# Patient Record
Sex: Male | Born: 1935
Health system: Southern US, Community
[De-identification: ages and names within clinical notes are randomized; demographics above are authoritative.]

## PROBLEM LIST (undated history)

## (undated) DIAGNOSIS — K449 Diaphragmatic hernia without obstruction or gangrene: Secondary | ICD-10-CM

## (undated) DIAGNOSIS — D333 Benign neoplasm of cranial nerves: Secondary | ICD-10-CM

## (undated) DIAGNOSIS — I4891 Unspecified atrial fibrillation: Secondary | ICD-10-CM

## (undated) DIAGNOSIS — I493 Ventricular premature depolarization: Secondary | ICD-10-CM

## (undated) DIAGNOSIS — K2970 Gastritis, unspecified, without bleeding: Secondary | ICD-10-CM

## (undated) DIAGNOSIS — Z86010 Personal history of colonic polyps: Secondary | ICD-10-CM

## (undated) DIAGNOSIS — I10 Essential (primary) hypertension: Secondary | ICD-10-CM

## (undated) DIAGNOSIS — I2510 Atherosclerotic heart disease of native coronary artery without angina pectoris: Secondary | ICD-10-CM

## (undated) DIAGNOSIS — I5032 Chronic diastolic (congestive) heart failure: Secondary | ICD-10-CM

## (undated) DIAGNOSIS — K648 Other hemorrhoids: Secondary | ICD-10-CM

## (undated) DIAGNOSIS — K222 Esophageal obstruction: Secondary | ICD-10-CM

## (undated) DIAGNOSIS — K5730 Diverticulosis of large intestine without perforation or abscess without bleeding: Secondary | ICD-10-CM

## (undated) DIAGNOSIS — I340 Nonrheumatic mitral (valve) insufficiency: Secondary | ICD-10-CM

## (undated) DIAGNOSIS — K2990 Gastroduodenitis, unspecified, without bleeding: Secondary | ICD-10-CM

## (undated) DIAGNOSIS — J45909 Unspecified asthma, uncomplicated: Secondary | ICD-10-CM

## (undated) DIAGNOSIS — Z973 Presence of spectacles and contact lenses: Secondary | ICD-10-CM

## (undated) DIAGNOSIS — I4819 Other persistent atrial fibrillation: Secondary | ICD-10-CM

## (undated) DIAGNOSIS — I351 Nonrheumatic aortic (valve) insufficiency: Secondary | ICD-10-CM

## (undated) DIAGNOSIS — J40 Bronchitis, not specified as acute or chronic: Secondary | ICD-10-CM

## (undated) HISTORY — DX: Personal history of colonic polyps: Z86.010

## (undated) HISTORY — PX: CARDIAC CATHETERIZATION: SHX172

## (undated) HISTORY — DX: Gastroduodenitis, unspecified, without bleeding: K29.90

## (undated) HISTORY — PX: TESTICLAR CYST EXCISION: SHX2492

## (undated) HISTORY — DX: Atherosclerotic heart disease of native coronary artery without angina pectoris: I25.10

## (undated) HISTORY — PX: TONSILLECTOMY: SUR1361

## (undated) HISTORY — DX: Chronic diastolic (congestive) heart failure: I50.32

## (undated) HISTORY — DX: Unspecified asthma, uncomplicated: J45.909

## (undated) HISTORY — DX: Bronchitis, not specified as acute or chronic: J40

## (undated) HISTORY — PX: KNEE SURGERY: SHX244

## (undated) HISTORY — DX: Essential (primary) hypertension: I10

## (undated) HISTORY — DX: Ventricular premature depolarization: I49.3

## (undated) HISTORY — DX: Nonrheumatic aortic (valve) insufficiency: I35.1

## (undated) HISTORY — DX: Esophageal obstruction: K22.2

## (undated) HISTORY — DX: Other hemorrhoids: K64.8

## (undated) HISTORY — DX: Benign neoplasm of cranial nerves: D33.3

## (undated) HISTORY — DX: Gastritis, unspecified, without bleeding: K29.70

## (undated) HISTORY — DX: Diaphragmatic hernia without obstruction or gangrene: K44.9

## (undated) HISTORY — DX: Nonrheumatic mitral (valve) insufficiency: I34.0

## (undated) HISTORY — DX: Other persistent atrial fibrillation: I48.19

## (undated) HISTORY — DX: Diverticulosis of large intestine without perforation or abscess without bleeding: K57.30

---

## 1998-07-23 ENCOUNTER — Ambulatory Visit (HOSPITAL_COMMUNITY): Admission: RE | Admit: 1998-07-23 | Discharge: 1998-07-23 | Payer: Self-pay | Admitting: Cardiology

## 1999-06-24 ENCOUNTER — Ambulatory Visit (HOSPITAL_COMMUNITY): Admission: RE | Admit: 1999-06-24 | Discharge: 1999-06-24 | Payer: Self-pay | Admitting: *Deleted

## 1999-06-24 ENCOUNTER — Encounter: Payer: Self-pay | Admitting: *Deleted

## 1999-09-19 ENCOUNTER — Inpatient Hospital Stay (HOSPITAL_COMMUNITY): Admission: EM | Admit: 1999-09-19 | Discharge: 1999-09-21 | Payer: Self-pay | Admitting: Emergency Medicine

## 1999-09-19 ENCOUNTER — Encounter: Payer: Self-pay | Admitting: Emergency Medicine

## 2002-03-16 ENCOUNTER — Encounter (INDEPENDENT_AMBULATORY_CARE_PROVIDER_SITE_OTHER): Payer: Self-pay | Admitting: Specialist

## 2002-03-16 ENCOUNTER — Ambulatory Visit (HOSPITAL_COMMUNITY): Admission: RE | Admit: 2002-03-16 | Discharge: 2002-03-16 | Payer: Self-pay | Admitting: Gastroenterology

## 2003-04-02 ENCOUNTER — Emergency Department (HOSPITAL_COMMUNITY): Admission: EM | Admit: 2003-04-02 | Discharge: 2003-04-02 | Payer: Self-pay

## 2003-09-29 ENCOUNTER — Encounter: Admission: RE | Admit: 2003-09-29 | Discharge: 2003-09-29 | Payer: Self-pay | Admitting: Internal Medicine

## 2004-02-28 ENCOUNTER — Ambulatory Visit (HOSPITAL_COMMUNITY): Admission: RE | Admit: 2004-02-28 | Discharge: 2004-02-28 | Payer: Self-pay | Admitting: Gastroenterology

## 2004-02-28 ENCOUNTER — Encounter (INDEPENDENT_AMBULATORY_CARE_PROVIDER_SITE_OTHER): Payer: Self-pay | Admitting: *Deleted

## 2004-09-24 ENCOUNTER — Ambulatory Visit: Payer: Self-pay | Admitting: Gastroenterology

## 2004-10-30 ENCOUNTER — Ambulatory Visit (HOSPITAL_COMMUNITY): Admission: RE | Admit: 2004-10-30 | Discharge: 2004-10-30 | Payer: Self-pay | Admitting: Gastroenterology

## 2004-10-30 ENCOUNTER — Ambulatory Visit: Payer: Self-pay | Admitting: Gastroenterology

## 2004-10-30 ENCOUNTER — Encounter (INDEPENDENT_AMBULATORY_CARE_PROVIDER_SITE_OTHER): Payer: Self-pay | Admitting: Specialist

## 2006-02-04 ENCOUNTER — Ambulatory Visit: Payer: Self-pay | Admitting: Gastroenterology

## 2006-02-11 ENCOUNTER — Ambulatory Visit: Payer: Self-pay | Admitting: Gastroenterology

## 2006-02-11 ENCOUNTER — Encounter: Payer: Self-pay | Admitting: Cardiology

## 2006-02-11 ENCOUNTER — Encounter (INDEPENDENT_AMBULATORY_CARE_PROVIDER_SITE_OTHER): Payer: Self-pay | Admitting: Specialist

## 2006-12-11 ENCOUNTER — Ambulatory Visit: Payer: Self-pay | Admitting: Gastroenterology

## 2006-12-14 ENCOUNTER — Ambulatory Visit: Payer: Self-pay | Admitting: Gastroenterology

## 2007-01-13 ENCOUNTER — Ambulatory Visit: Payer: Self-pay | Admitting: Gastroenterology

## 2007-01-13 ENCOUNTER — Encounter (INDEPENDENT_AMBULATORY_CARE_PROVIDER_SITE_OTHER): Payer: Self-pay | Admitting: *Deleted

## 2007-12-31 ENCOUNTER — Ambulatory Visit: Payer: Self-pay | Admitting: Gastroenterology

## 2008-01-12 ENCOUNTER — Encounter: Payer: Self-pay | Admitting: Gastroenterology

## 2008-01-12 ENCOUNTER — Ambulatory Visit: Payer: Self-pay | Admitting: Gastroenterology

## 2008-01-12 DIAGNOSIS — Z860100 Personal history of colon polyps, unspecified: Secondary | ICD-10-CM

## 2008-01-12 HISTORY — DX: Personal history of colonic polyps: Z86.010

## 2008-01-12 HISTORY — DX: Personal history of colon polyps, unspecified: Z86.0100

## 2008-06-21 ENCOUNTER — Telehealth: Payer: Self-pay | Admitting: Gastroenterology

## 2008-06-28 DIAGNOSIS — Z860100 Personal history of colon polyps, unspecified: Secondary | ICD-10-CM | POA: Insufficient documentation

## 2008-06-28 DIAGNOSIS — I10 Essential (primary) hypertension: Secondary | ICD-10-CM | POA: Insufficient documentation

## 2008-06-28 DIAGNOSIS — K648 Other hemorrhoids: Secondary | ICD-10-CM | POA: Insufficient documentation

## 2008-06-28 DIAGNOSIS — K222 Esophageal obstruction: Secondary | ICD-10-CM | POA: Insufficient documentation

## 2008-06-28 DIAGNOSIS — K449 Diaphragmatic hernia without obstruction or gangrene: Secondary | ICD-10-CM | POA: Insufficient documentation

## 2008-06-28 DIAGNOSIS — K5730 Diverticulosis of large intestine without perforation or abscess without bleeding: Secondary | ICD-10-CM | POA: Insufficient documentation

## 2008-06-29 ENCOUNTER — Ambulatory Visit: Payer: Self-pay | Admitting: Gastroenterology

## 2008-06-29 DIAGNOSIS — K219 Gastro-esophageal reflux disease without esophagitis: Secondary | ICD-10-CM | POA: Insufficient documentation

## 2008-07-03 ENCOUNTER — Inpatient Hospital Stay (HOSPITAL_BASED_OUTPATIENT_CLINIC_OR_DEPARTMENT_OTHER): Admission: RE | Admit: 2008-07-03 | Discharge: 2008-07-03 | Payer: Self-pay | Admitting: Cardiology

## 2008-08-01 ENCOUNTER — Ambulatory Visit: Payer: Self-pay | Admitting: Gastroenterology

## 2008-09-27 ENCOUNTER — Telehealth: Payer: Self-pay | Admitting: Gastroenterology

## 2008-11-27 ENCOUNTER — Encounter: Admission: RE | Admit: 2008-11-27 | Discharge: 2008-11-27 | Payer: Self-pay | Admitting: Otolaryngology

## 2008-12-06 ENCOUNTER — Telehealth: Payer: Self-pay | Admitting: Gastroenterology

## 2008-12-14 ENCOUNTER — Encounter (INDEPENDENT_AMBULATORY_CARE_PROVIDER_SITE_OTHER): Payer: Self-pay | Admitting: *Deleted

## 2009-01-17 ENCOUNTER — Ambulatory Visit: Payer: Self-pay | Admitting: Gastroenterology

## 2009-01-17 ENCOUNTER — Telehealth: Payer: Self-pay | Admitting: Gastroenterology

## 2009-01-26 ENCOUNTER — Telehealth: Payer: Self-pay | Admitting: Gastroenterology

## 2009-01-30 ENCOUNTER — Telehealth: Payer: Self-pay | Admitting: Gastroenterology

## 2009-01-31 ENCOUNTER — Ambulatory Visit: Payer: Self-pay | Admitting: Gastroenterology

## 2009-06-01 ENCOUNTER — Encounter: Admission: RE | Admit: 2009-06-01 | Discharge: 2009-06-01 | Payer: Self-pay | Admitting: Otolaryngology

## 2009-09-03 ENCOUNTER — Telehealth: Payer: Self-pay | Admitting: Gastroenterology

## 2009-11-22 ENCOUNTER — Telehealth: Payer: Self-pay | Admitting: Gastroenterology

## 2009-11-27 ENCOUNTER — Ambulatory Visit: Payer: Self-pay | Admitting: Gastroenterology

## 2009-11-27 ENCOUNTER — Encounter: Admission: RE | Admit: 2009-11-27 | Discharge: 2009-11-27 | Payer: Self-pay | Admitting: Otolaryngology

## 2009-11-29 ENCOUNTER — Telehealth: Payer: Self-pay | Admitting: Gastroenterology

## 2009-12-03 ENCOUNTER — Encounter: Payer: Self-pay | Admitting: Gastroenterology

## 2009-12-03 ENCOUNTER — Ambulatory Visit (HOSPITAL_COMMUNITY): Admission: RE | Admit: 2009-12-03 | Discharge: 2009-12-03 | Payer: Self-pay | Admitting: Gastroenterology

## 2009-12-07 ENCOUNTER — Ambulatory Visit: Payer: Self-pay | Admitting: Gastroenterology

## 2009-12-13 ENCOUNTER — Telehealth (INDEPENDENT_AMBULATORY_CARE_PROVIDER_SITE_OTHER): Payer: Self-pay | Admitting: *Deleted

## 2010-10-08 ENCOUNTER — Telehealth: Payer: Self-pay | Admitting: Gastroenterology

## 2010-11-05 ENCOUNTER — Encounter
Admission: RE | Admit: 2010-11-05 | Discharge: 2010-11-05 | Payer: Self-pay | Source: Home / Self Care | Attending: Internal Medicine | Admitting: Internal Medicine

## 2010-11-13 ENCOUNTER — Other Ambulatory Visit (HOSPITAL_COMMUNITY): Payer: Self-pay | Admitting: Internal Medicine

## 2010-11-13 DIAGNOSIS — M999 Biomechanical lesion, unspecified: Secondary | ICD-10-CM

## 2010-11-14 NOTE — Progress Notes (Signed)
Summary: procedure ?   Phone Note Call from Patient Call back at Home Phone [phone]   Caller: Patient Call For: Dr. Jarold Motto Reason for Call: Talk to Nurse Summary of Call: pt wants to know if he can take his blood pressure medication the morning of his hospital procedure Initial call taken by: Vallarie Mare,  November 29, 2009 4:30 PM  Follow-up for Phone Call        Per monometry instructions - Nothing to eat or drink after midnight.  Hold meds until after proc.  Pt notified. Follow-up by: Ashok Cordia RN,  November 29, 2009 4:33 PM

## 2010-11-14 NOTE — Progress Notes (Signed)
Summary: Test results   Phone Note Outgoing Call   Call placed by: Ashok Cordia RN,  December 13, 2009 10:14 AM Summary of Call: LM for pt to call.  To go over results of Monometry and 24 hr ph study. Initial call taken by: Ashok Cordia RN,  December 13, 2009 10:15 AM  Follow-up for Phone Call        Discussed results of manometry and ph study with pt.   Follow-up by: Ashok Cordia RN,  December 13, 2009 11:59 AM

## 2010-11-14 NOTE — Progress Notes (Signed)
Summary: refill   Phone Note Call from Patient Call back at Home Phone [phone]   Caller: Patient Call For: Dr Jarold Motto Reason for Call: Refill Medication Summary of Call: Patient needs an rx called in for Dexilant and would also like samples. Initial call taken by: Tawni Levy,  October 08, 2010 10:42 AM  Follow-up for Phone Call        pt states that he gets his Dexilant from [place] and he needs to pick up a rx for this one is printed and I will have Dr. Maryruth Hancock it when he returns on 10/16/2010. and pt would like samples of Dexilant which I will leave at the front desk when his rx is signed.  Follow-up by: Harlow Mares CMA Duncan Dull),  October 08, 2010 11:08 AM    Prescriptions: DEXILANT 60 MG CPDR (DEXLANSOPRAZOLE) 1 by mouth qd  #90 x 3   Entered by:   Harlow Mares CMA (AAMA)   Authorized by:   Mardella Layman MD [facility]   Signed by:   Harlow Mares CMA (AAMA) on 10/08/2010   Method used:   Print then Give to Patient   RxID:   [id number]

## 2010-11-14 NOTE — Assessment & Plan Note (Signed)
Summary: Esophageal spasms/dfs    History of Present Illness Visit Type: Follow-up Visit Primary GI MD: Sheryn Bison MD FACP FAGA Primary Provider: Creola Corn, MD Chief Complaint: Patient c/o several weeks nocturnal chest pain. He c/o difficulty swallowing pills and fluids. He has occasional increased belching. He denies any other symptoms. History of Present Illness:   77 -year-old Caucasian male with hypertensive cardiovascular disease and recurrent cardiac palpitations, noncardiac chest pain, asthmatic bronchitis, chronic acid reflux, and a history of recurrent colon polyps with frequent colonoscopies. He currently complains of awakening at 3:00 with chest pressure and a throbbing sensation substernally which will last one hour in duration. This is not associated with typical reflux symptoms, dysphagia, or any hepatobiliary complaints. He currently is undergoing Holter monitors in addition per Dr. Donnie Aho, cardiology. His primary care physician is Dr. Creola Corn.  Mr. Dorris Fetch has had several endoscopic exams which have shown erosive esophagitis and recurrent peptic strictures of his esophagus. He is chronically on Dexilant 60 mg q.a.m. with p.r.n. liquid Carafate. He continues with atypical chest pain occasionally exacerbated by extremely cold liquids. He has not had previous manometry or 24-hour pH probe testing. He has had several negative upper abdominal ultrasound exams. He uses p.r.n. sublingual Levsin. Is on multiple antihypertensive medications. Despite these complaints he has had no anorexia or weight loss. He denies any specific hepatobiliary complaints or lower GI problems at this time, melena, or hematochezia. He denies systemic complaints such as fever, chills, skin rashes, etc. He does take daily aspirin 81 mg.   GI Review of Systems    Reports acid reflux, belching, chest pain, dysphagia with liquids, and  heartburn.      Denies abdominal pain, bloating, dysphagia with solids,  loss of appetite, nausea, vomiting, vomiting blood, weight loss, and  weight gain.      Reports constipation.     Denies anal fissure, black tarry stools, change in bowel habit, diarrhea, diverticulosis, fecal incontinence, heme positive stool, hemorrhoids, irritable bowel syndrome, jaundice, light color stool, liver problems, rectal bleeding, and  rectal pain.    Current Medications (verified): 1)  Procardia Xl 30 Mg Xr24h-Tab (Nifedipine) .... One By Mouth Once Daily 2)  Hyzaar 100-25 Mg Tabs (Losartan Potassium-Hctz) .... 1/2 By Mouth Every Morning 3)  Dexilant 60 Mg Cpdr (Dexlansoprazole) .Marland Kitchen.. 1 By Mouth Qd 4)  Adult Aspirin Ec Low Strength 81 Mg Tbec (Aspirin) .... One By Mouth Once Daily 5)  Finasteride 5 Mg Tabs (Finasteride) .... Take 1 Tablet By Mouth Once A Day 6)  Flomax 0.4 Mg Xr24h-Cap (Tamsulosin Hcl) .... One By Mouth Once Daily 7)  Vytorin 10-20 Mg Tabs (Ezetimibe-Simvastatin) .... One By Mouth Once Daily 8)  Carafate 1 Gm/27ml  Susp (Sucralfate) .... Take 10 Cc By Mouth Every 2-4 Hours As Needed 9)  Levsin 0.125 Mg  Tabs (Hyoscyamine Sulfate) .... Take One By Mouth Every 6 Hours As Needed For Chest Pain.  Allergies (verified): No Known Drug Allergies  Past History:  Past medical, surgical, family and social histories (including risk factors) reviewed for relevance to current acute and chronic problems.  Past Medical History: Reviewed history from 06/28/2008 and no changes required. Current Problems:  BRONCHITIS (ICD-490) HYPERTENSION (ICD-401.9) INTERNAL HEMORRHOIDS (ICD-455.0) ESOPHAGEAL STRICTURE (ICD-530.3) GASTRITIS (ICD-535.50) HIATAL HERNIA (ICD-553.3) DIVERTICULOSIS, COLON (ICD-562.10) COLONIC POLYPS, HX OF (ICD-V12.72)  Past Surgical History: testicle cyst removed patella removed left knee as a child Tonsillectomy Heart Cath.  Family History: Reviewed history from 06/28/2008 and no changes required. No FH of  Colon Cancer: Lung Cancer:  brother Family History of Heart Disease: father Family History of Kidney Disease: Father  Social History: Reviewed history from 06/28/2008 and no changes required. 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 Alcohol Use - no Patient has never smoked.   Review of Systems       The patient complains of allergy/sinus, back pain, hearing problems, muscle pains/cramps, nosebleeds, shortness of breath, sleeping problems, and urination - excessive.  The patient denies anemia, anxiety-new, arthritis/joint pain, blood in urine, breast changes/lumps, change in vision, confusion, cough, coughing up blood, depression-new, fainting, fatigue, fever, headaches-new, heart murmur, heart rhythm changes, itching, menstrual pain, night sweats, pregnancy symptoms, skin rash, sore throat, swelling of feet/legs, swollen lymph glands, thirst - excessive , urination - excessive , urination changes/pain, urine leakage, vision changes, and voice change.    Vital Signs:  Patient profile:   77 year old male Height:      70 inches Weight:      179.25 pounds BMI:     25.81 BSA:     1.99 Pulse rate:   64 / minute Pulse rhythm:   regular BP sitting:   128 / 68  (right arm)  Vitals Entered By: Hortense Ramal CMA Duncan Dull) (November 27, 2009 3:17 PM)  Physical Exam  General:  Well developed, well nourished, no acute distress.healthy appearing.   Head:  Normocephalic and atraumatic. Eyes:  PERRLA, no icterus.exam deferred to patient's ophthalmologist.   Neck:  Supple; no masses or thyromegaly. Chest Wall:  Symmetrical,  no deformities . Lungs:  Clear throughout to auscultation. Heart:  Regular rate and rhythm; no murmurs, rubs,  or bruits.He is a regular rhythm and I cannot appreciate any ectopic beats at this time. Abdomen:  Soft, nontender and nondistended. No masses, hepatosplenomegaly or hernias noted. Normal bowel sounds. Extremities:  No clubbing, cyanosis, edema or deformities  noted. Neurologic:  Alert and  oriented x4;  grossly normal neurologically. Cervical Nodes:  No significant cervical adenopathy. Axillary Nodes:  No significant axillary adenopathy. Inguinal Nodes:  No significant inguinal adenopathy. Psych:  Alert and cooperative. Normal mood and affect.   Impression & Recommendations:  Problem # 1:  GERD (ICD-530.81) Assessment Improved Will proceed with esophageal manometry and 24-hour pH probe testing off PPI medications and Carafate 5 days before these exams. It is unclear to me the exact cause of his atypical chest pain. He also has had repeat cardiac evaluation which have been nonrevealing. He may need followup endoscopic exam since this has not been performed in several years. He is to complete his Holter monitor exam as recommended by cardiology.  Problem # 2:  HYPERTENSION (ICD-401.9) Assessment: Improved blood pressure today is normal at 128/68 and pulse is 64 and regular. He is to continue Procardia, Hyzaar, finasteride, Flomax, Vytorin, and p.r.n. Levsin.  Problem # 3:  COLONIC POLYPS, HX OF (ICD-V12.72) Assessment: Unchanged followup colonoscopy as per clinical protocol.  Problem # 4:  DIVERTICULOSIS, COLON (ICD-562.10) Assessment: Improved High-Fiber diet as tolerated  Other Orders: Mano/24 hour pH Probe (Mano/24 pH)  Patient Instructions: 1)  Copy sent to : Dr. Creola Corn and Dr. Viann Fish 2)  Please continue current medications.  3)  Esophageal Manometry and 24 a pH probe testing off of PPI medication for 5 days before exams. 4)  Complete Holter monitor exam 5)  The medication list was reviewed and reconciled.  All changed / newly prescribed medications were explained.  A complete medication list was provided to the patient /  caregiver.

## 2010-11-14 NOTE — Procedures (Signed)
Summary: manometry & Ph probe   Esophageal Manometry  Procedure date:  12/03/2009  Findings:      normal:   Esophageal manometry was completed on December 03, 2009. Results are as follows:  #1 upper esophageal sphincter-there is normal coordination between pharyngeal contraction cricopharyngeal relaxation.  #2 lower esophageal sphincter-mean pressure is normal at 40 mm of mercury with normal relaxation of swallowing.  #3-motility pattern-they're normally propagated peristaltic waves of normal amplitude and duration throughout the length of the esophagus both wet and dry swallows. Mean amplitude of contractions is 198 mm of mercury.  Assessment: This a normal esophageal manometry without evidence of lower esophageal sphincter incompetency or esophageal spasm. This patient has atypical chest pain although he is managed for chronic GERD. 24-hour pH probe test was also completed today and is consistent with chronic daytime acid reflux.    24-hour pH probe testing results  Outpatient pH probe test was also accomplished. Total DeMeester score normal at 21 normal being less than 22. However, there is increased acid reflux in the upright position with total time the pH is less than 4 at 8% normal being less than 6.3%. Total time in reflux is also elevated at 5.2%, normal less than 4.2%. There are 6 reflux episodes normal less than 50. Longest reflux episode was 7.8 minutes. There is no proximal acid reflux noted and there is no nocturnal acid reflux noted.  Assessment: This 24 pH probe test shows mild daytime upright GERD. There is no nocturnal GERD and no correlation with his symptoms in any recorded episodes of acid reflux. We will continue his reflux regime and daily PPI therapy but I see no need for further GI evaluation this time.

## 2010-11-14 NOTE — Progress Notes (Signed)
Summary: esoph, spasms   Phone Note Call from Patient Call back at [phone]   Caller: Patient Call For: Dr. Jarold Motto Reason for Call: Talk to Nurse Summary of Call: pt says he has esophagial spasms at night and wants to know if this can make his blood pressure go up Initial call taken by: Vallarie Mare,  November 22, 2009 9:46 AM  Follow-up for Phone Call        Attempted to call pt, no answer. Lupita Leash Surface RN  November 22, 2009 1:53 PM  Talked with pt.  C/O chest pain during night.  Negative cardiac work up.  Appt scheduled. Follow-up by: Ashok Cordia RN,  November 23, 2009 2:44 PM

## 2010-11-18 ENCOUNTER — Ambulatory Visit (HOSPITAL_COMMUNITY)
Admission: RE | Admit: 2010-11-18 | Discharge: 2010-11-18 | Disposition: A | Discharge status: Home / Self Care | Payer: Medicare Other | Source: Ambulatory Visit | Attending: Internal Medicine | Admitting: Internal Medicine

## 2010-11-18 ENCOUNTER — Ambulatory Visit (HOSPITAL_COMMUNITY): Payer: Medicare Other

## 2010-11-18 ENCOUNTER — Other Ambulatory Visit: Payer: Self-pay | Admitting: Interventional Radiology

## 2010-11-18 DIAGNOSIS — R229 Localized swelling, mass and lump, unspecified: Secondary | ICD-10-CM | POA: Insufficient documentation

## 2010-11-18 DIAGNOSIS — M999 Biomechanical lesion, unspecified: Secondary | ICD-10-CM

## 2010-11-18 DIAGNOSIS — Z01812 Encounter for preprocedural laboratory examination: Secondary | ICD-10-CM | POA: Insufficient documentation

## 2010-11-18 LAB — CBC
HCT: 42.5 % (ref 39.0–52.0)
MCH: 30.7 pg (ref 26.0–34.0)
MCHC: 35.3 g/dL (ref 30.0–36.0)
MCV: 86.9 fL (ref 78.0–100.0)
Platelets: 142 10*3/uL — ABNORMAL LOW (ref 150–400)
RDW: 12.4 % (ref 11.5–15.5)

## 2011-02-25 NOTE — Cardiovascular Report (Signed)
Derek Schneider, Derek Schneider              ACCOUNT NO.:  [id number]   MEDICAL RECORD NO.:  [id number]          PATIENT TYPE:  OIB   LOCATION:  1962                         FACILITY:  [facility]   PHYSICIAN:  Georga Hacking, M.D.DATE OF BIRTH:  18-Jul-1926   DATE OF PROCEDURE:  07/03/2008  DATE OF DISCHARGE:  07/03/2008                            CARDIAC CATHETERIZATION   HISTORY:  The patient has a history of mild coronary artery disease with  last catheterization done in December 2000.  He had multiple visits to  the emergency room and to the office this year complaining of chest pain  with some radiation into the left arm.  He has a previous history of  esophageal spasm.  He has been having continued shoulder pain.   PROCEDURE:  Left heart catheterization with coronary angiograms and left  ventriculogram.   PROCEDURE IN DETAIL:  The procedure was done in the outpatient  diagnostic laboratory.  The right femoral artery was entered using a  single anterior needle wall stick and a 4-French sheath was placed.  The  procedure was done with a 4-French catheters and a 30 mL ventriculogram  was performed.  He tolerated the procedure well.   HEMODYNAMIC DATA:  Left ventricular 140/12-20, aorta postcontrast  148/61.  Coronary arteries arise and distribute normally.  There is very  mild calcification involving the coronary arteries.  Left main coronary  artery with mild irregularity.  Left anterior descending:  The vessel  was mildly calcified proximally.  A series of 3 diagonal branches arise  off the artery.  There is diffuse disease involving the LAD and the  diagonal branches that is not severely obstructive.  The optional  diagonal or intermediate branch arises, which could be considered the  first diagonal branch.  There is an eccentric 30% proximal LAD stenosis  after the origin of this diagonal branch.  The circumflex coronary  artery:  Contains no significant disease.  Right coronary  artery:  Dominant vessel with scattered irregularities supplies the posterior  descending and a series of several small posterolateral branches.   IMPRESSION:  1. Mild coronary artery disease, predominantly involving the left      anterior descending coronary artery with scattered irregularities      elsewhere.  2. Normal left ventricular function.      Georga Hacking, M.D.  Electronically Signed     WST/MEDQ  D:  07/03/2008  T:  07/03/2008  Job:  [id number]   cc:   Gwen Pounds, MD

## 2011-02-28 NOTE — Assessment & Plan Note (Signed)
[facility]                         GASTROENTEROLOGY OFFICE NOTE   Derek, Schneider                     MRN:          [id number]  DATE:12/11/2006                            DOB:          1926/01/18    Derek Schneider is a 77 year old white male with chronic asthmatic  bronchitis who I have been following for many years because of recurrent  colon polyps and chronic acid reflux disease.  Previous workup several  years ago for gallstones was negative.   The patient was supposed to be on regular Protonix therapy because of  his hiatal hernia and chronic GERD with recurrent peptic strictures of  his esophagus.  However, he stopped this and was taking p.r.n. Prilosec  and has had recurrence of burning pressure in his subxiphoid area  radiating to his chest without dysphagia.  He has had cardiac workup by  Dr. Donnie Schneider including stress testing which was normal.  He denies any  specific hepatobiliary complaints or lower GI problems at this time.  He  last had colonoscopy with removal of a flat villous adenoma  approximately a year ago.  His appetite is good and his weight is  stable.  He denies recent use of steroids or antibiotics.  He has had no  trauma to his chest or abdominal area.  He has no systemic complaints,  his appetite is good, and his weight is stable.  He has been using large  amounts of caffeine which he has cut back on at this time.   MEDICATIONS:  1. Procardia 30 mg a day.  2. Hyzaar 100/25 mg one-half a tablet a day.  3. Protonix 40 mg a day.  4. Aspirin 325 mg a day.  5. Avodart 0.5 mg a day.  6. Vytorin 20 mg a day.  7. He uses p.r.n. Proventil and albuterol inhalers.   He weighs 187 Schneider and blood pressure is 110/68.  Pulse was 58 and  regular.  I could not appreciate stigmata of chronic liver disease.  Chest was clear and cardiac exam showed him to be in a regular rhythm  without murmurs, gallops or rubs.  I could not  appreciate  hepatosplenomegaly, abdominal mass or tenderness.  Bowel sounds were  normal.   ASSESSMENT:  1. [name] probably has worsening acid reflux, rule out      cholelithiasis.  2. Need for followup colonoscopy per flat villous adenoma of the right      colon which the followup is due in 1 year's time.  3. History of asthmatic bronchitis.  The patient has not smoked in      many years and denies ethanol abuse.   RECOMMENDATIONS:  1. Strict antireflux maneuvers.  2. Continue Protonix 40 mg twice a day with Carafate suspension two      teaspoons after meals and at bedtime.  3. P.r.n. sublingual Levsin use.  4. Outpatient endoscopy, colonoscopy, and ultrasound exam.  5. Continue other medications as per Dr. Donnie Schneider and Dr. Timothy Schneider.     Derek Schneider. Derek Motto, MD, Derek Schneider, FAGA  Electronically Signed    DRP/MedQ  DD: 12/11/2006  DT: 12/11/2006  Job #: [id number]   cc:   Derek Pounds, MD  Derek Schneider, M.D.

## 2011-02-28 NOTE — H&P (Signed)
[place]  Patient:    JOHNWESLEY Schneider                      MRN: [id number] Adm. Date:  [id number] Attending:  Norman Clay CC:         Winn Jock Charmian Muff, M.D.                         History and Physical  REASON FOR ADMISSION:  Arm numbness and weakness with vague chest pain.  HISTORY:  The patient is a 77 year old male who has a prior history of hypertension.  In 1991, he had an exercise treadmill test prior to beginning an  exercise program which was abnormal.  An exercise stress test showed a possible old scar and possible reversible ischemia.  He underwent cardiac catheterization at  that time with findings of minimal coronary artery disease.  He had done well and was seen at the office one year ago when he had some atypical-type chest pain that he was seeing at Dr. Janene Madeira office.  It was dull with a pleuritic component. e had an abnormal CPK total, but not MB was drawn.  He has vague indigestion symptoms but had be able to be physically active.  A stress Cardiolite study was normal t that time.  He had done well but has had some continued atypical-type chest pain and indigestion pain but has been able to be active, walking, playing tennis, and doing normal exercises.  He awoke with severe weakness and bilateral arm numbness and had some vague anterior chest pain.  He took an aspirin.  His arms felt weak, and he came to the emergency room.  An EKG was unremarkable. CPK was drawn and returned elevated with an MB of 9.  Troponin was negative, and I was asked to see him.  He complains of vague anterior chest pain which is minimal at this time.  PAST MEDICAL HISTORY:  Remarkable for hypertension.  There is history of hyperlipidemia previously.  There is no history of diabetes.  PAST SURGICAL HISTORY:  Knee surgery, tonsillectomy, cyst on right testicle in he 1990s.  ALLERGIES:  None.  CURRENT MEDICATIONS: 1.  Procardia XL 30 mg daily. 2. HCTZ 25 mg daily. 3. Aspirin daily.  FAMILY HISTORY:  Father died at age 32 of an MI.  Mother died at age 82.  Three  brothers are living.  Two sisters are living.  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 He works out regularly with vigorous exercise.  He does not smoke or use alcohol to excess.  He is married.  REVIEW OF SYSTEMS:  Occasional cramping in the legs at rest, particularly in the left leg.  No claudication, no TIAs, some dyspepsia.  He has had a recent upper  respiratory infection with some bloody nose and had used a decongestant as well as Nasonex.   His blood pressure was somewhat elevated on admission to the hospital.  PHYSICAL EXAMINATION:  GENERAL:  He is a pleasant male who appears in no acute distress.  VITAL SIGNS:  Blood pressure currently 135/80, pulse 70.  SKIN:  Warm and dry.  HEENT:  Normal.  He is wearing oxygen.  EOMI.  PERRLA.  C&S clear.  Pharynx negative.  NECK:  Supple without masses, thyromegaly, or carotid bruits.  LUNGS:  Clear.  CARDIOVASCULAR:  Normal S1 and S2.  No S3 or murmur.  ABDOMEN:  Soft, nontender.  EXTREMITIES:   Femoral and distal pulses 2+, no edema noted.  NEUROLOGIC:  Normal.  LABORATORY DATA:  A 12-lead ECG shows IV conduction delays, otherwise normal.  Chest x-ray unremarkable.  IMPRESSION: 1. A prolonged episode of bilateral arm numbness and weakness with vague chest    discomfort.  Rule out anginal equivalent in a patient with symptoms that are    better with nitroglycerin and with a positive CK-MB. 2. Hypertension. 3. Prior history of hyperlipidemia. 4. Recent upper respiratory infection.  RECOMMENDATIONS: 1. Begin IV heparin and nitroglycerin. 2. Check serial CPK 3. Likely will go ahead with urgent cardiac catheterization in view of the positive    enzymes to assess for worsening or change in coronary artery disease status. DD:   09/19/99 TD:  09/19/99 Job: [id number]

## 2011-02-28 NOTE — Discharge Summary (Signed)
[place]  Patient:    Derek Schneider                      MRN: [id number] Adm. Date:  [id number] Disc. Date: 09/20/99 Attending:  Norman Clay CC:         Winn Jock Charmian Muff, M.D.                  Referring Physician Discharge [place]  FINAL DIAGNOSES: 1. Bilateral arm numbness and weakness, questionable cause. 2. Borderline elevation of CPK and MB with minimal coronary artery disease at    cardiac catheterization, questionable coronary spasm. 3. Hypertension. 4. Mild hyperlipidemia.  PROCEDURES:  Cardiac catheterization.  HISTORY:  The patient is a 77 year old male who had a normal stress Cardiolite study about a year ago.  He awoke with severe weakness and bilateral arm numbness, had vague anterior chest pain.  He took an aspirin and came to the emergency room. A head CT scan was unremarkable.  CPK was minimally elevated, with mild MB of 9. Troponin was negative.  EKG was unremarkable.  He was admitted and placed on heparin and IV nitroglycerin while following this.  He was taken to the catheterization laboratory on September 20, 1999, with findings of a mild to moderate 30-40% LAD stenosis.  Minimal irregularities were present in the other vessels.  The results were discussed with the patient.  It was recommended at this time that he continue medical therapy.  He is already on Procardia, and it was thought that he may have had an upper respiratory infection, with decongestants that could have induced some spasm versus having a chronic elevation of CPK.  CPK will be checked prior to discharge and also as an outpatient, to see if this is in fact the case.  If CPK comes back down to normal, it is possible he may have had some small spasm to account for this episode.  He is discharged at this time in improved condition.  DISCHARGE MEDICATIONS: 1. Aspirin daily. 2. Procardia XL 30 mg daily. 3. HCTZ 25 mg daily. 4.  Nitroglycerin p.r.n.  FOLLOW-UP:  He is to follow up in one week.  ACTIVITY:  He is to restrict his activities for the next week.  We will do another Cardiolite prior to starting an exercise program. DD:  09/20/99 TD:  09/20/99 Job: [id number]

## 2011-04-30 ENCOUNTER — Telehealth: Payer: Self-pay | Admitting: Gastroenterology

## 2011-04-30 MED ORDER — DEXLANSOPRAZOLE 60 MG PO CPDR: 60.0000 mg | DELAYED_RELEASE_CAPSULE | Freq: Every day | ORAL | Status: DC

## 2011-04-30 NOTE — Telephone Encounter (Signed)
Samples out front for pick up and I have sent rx to [facility] advised when i get a denial i will do prior auth and contact him when I get an answer on the prior auth

## 2011-09-18 ENCOUNTER — Other Ambulatory Visit: Payer: Self-pay | Admitting: Gastroenterology

## 2011-10-04 ENCOUNTER — Other Ambulatory Visit: Payer: Self-pay | Admitting: Gastroenterology

## 2011-10-09 ENCOUNTER — Telehealth: Payer: Self-pay | Admitting: Gastroenterology

## 2011-10-09 MED ORDER — DEXLANSOPRAZOLE 60 MG PO CPDR: 60.0000 mg | DELAYED_RELEASE_CAPSULE | Freq: Every day | ORAL | Status: DC

## 2011-10-09 NOTE — Telephone Encounter (Signed)
Informed patient that I will give him one refill to [facility] but he must schedule an appointment in the next several weeks for a follow up visit to see Dr. Jarold Motto. Pt agreed and verbalized understanding.

## 2011-10-31 DIAGNOSIS — I2510 Atherosclerotic heart disease of native coronary artery without angina pectoris: Secondary | ICD-10-CM | POA: Diagnosis not present

## 2011-10-31 DIAGNOSIS — R0989 Other specified symptoms and signs involving the circulatory and respiratory systems: Secondary | ICD-10-CM | POA: Diagnosis not present

## 2011-10-31 DIAGNOSIS — R0609 Other forms of dyspnea: Secondary | ICD-10-CM | POA: Diagnosis not present

## 2011-10-31 DIAGNOSIS — I4949 Other premature depolarization: Secondary | ICD-10-CM | POA: Diagnosis not present

## 2011-10-31 DIAGNOSIS — I10 Essential (primary) hypertension: Secondary | ICD-10-CM | POA: Diagnosis not present

## 2011-10-31 DIAGNOSIS — E785 Hyperlipidemia, unspecified: Secondary | ICD-10-CM | POA: Diagnosis not present

## 2011-11-17 ENCOUNTER — Ambulatory Visit (INDEPENDENT_AMBULATORY_CARE_PROVIDER_SITE_OTHER): Payer: Medicare Other | Admitting: Gastroenterology

## 2011-11-17 ENCOUNTER — Encounter: Payer: Self-pay | Admitting: Gastroenterology

## 2011-11-17 VITALS — BP 142/64 | HR 70 | Ht 69.0 in | Wt 173.0 lb

## 2011-11-17 DIAGNOSIS — R0789 Other chest pain: Secondary | ICD-10-CM | POA: Diagnosis not present

## 2011-11-17 DIAGNOSIS — Z8719 Personal history of other diseases of the digestive system: Secondary | ICD-10-CM | POA: Insufficient documentation

## 2011-11-17 MED ORDER — DEXLANSOPRAZOLE 60 MG PO CPDR: 60.0000 mg | DELAYED_RELEASE_CAPSULE | Freq: Every day | ORAL | Status: DC

## 2011-11-17 NOTE — Patient Instructions (Addendum)
Continue all medications. Buy Metamucil OTC and take once a day.

## 2011-11-17 NOTE — Progress Notes (Signed)
This is a 78 year old Caucasian male with chronic chest pain from acid reflux esophagitis and associated esophageal spasm. He is up-to-date on his endoscopy and colonoscopy exams, and is currently asymptomatic on PPI therapy and when necessary liquid Carafate. He denies any upper gastrointestinal or hepatobiliary complaints. He is followed closely by Dr. Donnie Aho in cardiology hypertension and frequent PVCs. He denies melena or hematochezia, but has mild constipation. He specifically denies dysphagia, anorexia or weight loss.  Current Medications, Allergies, Past Medical History, Past Surgical History, Family History and Social History were reviewed in [employer] record.  Pertinent Review of Systems Negative   Physical Exam: Relative healthy appearing patient in no acute distress. Blood pressure 142/64 and pulse is 70 and regular. Cannot appreciate stigmata of chronic liver disease. Chest is clear to percussion and auscultation, and he appeared to be a regular rhythm without significant murmurs gallops or rubs. There is no organomegaly, abdominal masses or tenderness. Bowel sounds are normal. Peripheral extremities unremarkable. Mental status normal.    Assessment and Plan: Atypical chest pain with previously normal esophageal manometry did not confirm esophageal spasm. He is responded well to treatment for acid reflux which we will continue as outlined. I will see him on a when necessary basis as needed. He is up-to-date on his endoscopy and colonoscopy exams. No diagnosis found.

## 2012-02-24 ENCOUNTER — Other Ambulatory Visit: Payer: Self-pay | Admitting: Gastroenterology

## 2012-02-24 MED ORDER — DEXLANSOPRAZOLE 60 MG PO CPDR: 60.0000 mg | DELAYED_RELEASE_CAPSULE | Freq: Every day | ORAL | Status: DC

## 2012-02-24 NOTE — Telephone Encounter (Signed)
rx sent, pr aware

## 2012-03-02 DIAGNOSIS — R972 Elevated prostate specific antigen [PSA]: Secondary | ICD-10-CM | POA: Diagnosis not present

## 2012-03-02 DIAGNOSIS — N401 Enlarged prostate with lower urinary tract symptoms: Secondary | ICD-10-CM | POA: Diagnosis not present

## 2012-03-16 DIAGNOSIS — Z125 Encounter for screening for malignant neoplasm of prostate: Secondary | ICD-10-CM | POA: Diagnosis not present

## 2012-03-16 DIAGNOSIS — I10 Essential (primary) hypertension: Secondary | ICD-10-CM | POA: Diagnosis not present

## 2012-03-16 DIAGNOSIS — E785 Hyperlipidemia, unspecified: Secondary | ICD-10-CM | POA: Diagnosis not present

## 2012-03-23 DIAGNOSIS — Z0000 Encounter for general adult medical examination without abnormal findings: Secondary | ICD-10-CM | POA: Diagnosis not present

## 2012-03-23 DIAGNOSIS — N189 Chronic kidney disease, unspecified: Secondary | ICD-10-CM | POA: Diagnosis not present

## 2012-03-23 DIAGNOSIS — E785 Hyperlipidemia, unspecified: Secondary | ICD-10-CM | POA: Diagnosis not present

## 2012-03-23 DIAGNOSIS — I2510 Atherosclerotic heart disease of native coronary artery without angina pectoris: Secondary | ICD-10-CM | POA: Diagnosis not present

## 2012-03-24 DIAGNOSIS — Z1212 Encounter for screening for malignant neoplasm of rectum: Secondary | ICD-10-CM | POA: Diagnosis not present

## 2012-03-25 DIAGNOSIS — H903 Sensorineural hearing loss, bilateral: Secondary | ICD-10-CM | POA: Diagnosis not present

## 2012-04-12 DIAGNOSIS — H903 Sensorineural hearing loss, bilateral: Secondary | ICD-10-CM | POA: Diagnosis not present

## 2012-04-12 DIAGNOSIS — D333 Benign neoplasm of cranial nerves: Secondary | ICD-10-CM | POA: Diagnosis not present

## 2012-04-12 DIAGNOSIS — H905 Unspecified sensorineural hearing loss: Secondary | ICD-10-CM | POA: Diagnosis not present

## 2012-04-14 ENCOUNTER — Other Ambulatory Visit: Payer: Self-pay | Admitting: Otolaryngology

## 2012-04-14 DIAGNOSIS — H905 Unspecified sensorineural hearing loss: Secondary | ICD-10-CM

## 2012-04-16 DIAGNOSIS — I10 Essential (primary) hypertension: Secondary | ICD-10-CM | POA: Diagnosis not present

## 2012-04-16 DIAGNOSIS — H1030 Unspecified acute conjunctivitis, unspecified eye: Secondary | ICD-10-CM | POA: Diagnosis not present

## 2012-04-22 ENCOUNTER — Ambulatory Visit
Admission: RE | Admit: 2012-04-22 | Discharge: 2012-04-22 | Disposition: A | Discharge status: Home / Self Care | Payer: Medicare Other | Source: Ambulatory Visit | Attending: Otolaryngology | Admitting: Otolaryngology

## 2012-04-22 DIAGNOSIS — I6789 Other cerebrovascular disease: Secondary | ICD-10-CM | POA: Diagnosis not present

## 2012-04-22 DIAGNOSIS — H905 Unspecified sensorineural hearing loss: Secondary | ICD-10-CM

## 2012-04-22 MED ORDER — GADOBENATE DIMEGLUMINE 529 MG/ML IV SOLN
15.0000 mL | Freq: Once | INTRAVENOUS | Status: AC | PRN
  Administered 2012-04-22: 15 mL via INTRAVENOUS

## 2012-07-11 ENCOUNTER — Other Ambulatory Visit: Payer: Self-pay | Admitting: *Deleted

## 2012-07-11 ENCOUNTER — Ambulatory Visit (INDEPENDENT_AMBULATORY_CARE_PROVIDER_SITE_OTHER): Payer: Medicare Other | Admitting: Family Medicine

## 2012-07-11 VITALS — BP 148/84 | HR 58 | Temp 98.0°F | Resp 17 | Ht 67.0 in | Wt 177.0 lb

## 2012-07-11 DIAGNOSIS — R42 Dizziness and giddiness: Secondary | ICD-10-CM

## 2012-07-11 DIAGNOSIS — R002 Palpitations: Secondary | ICD-10-CM | POA: Diagnosis not present

## 2012-07-11 LAB — POCT CBC
Granulocyte percent: 65.3 %G (ref 37–80)
MCV: 94.5 fL (ref 80–97)
MID (cbc): 0.6 (ref 0–0.9)
MPV: 11.3 fL (ref 0–99.8)
POC LYMPH PERCENT: 26.7 %L (ref 10–50)
POC MID %: 8 %M (ref 0–12)
Platelet Count, POC: 177 10*3/uL (ref 142–424)
RBC: 4.84 M/uL (ref 4.69–6.13)
RDW, POC: 13.3 %

## 2012-07-11 MED ORDER — MECLIZINE HCL 25 MG PO TABS: 25.0000 mg | ORAL_TABLET | Freq: Three times a day (TID) | ORAL | Status: DC | PRN

## 2012-07-11 NOTE — Patient Instructions (Signed)
Follow up with Dr. Donnie Aho tomorrow.  Can try meclizine up to 3 times per day if needed for vertigo symptoms - this may make you sleepy.  Go to the nearest emergency room if any of your symptoms worsen or recur overnight.

## 2012-07-11 NOTE — Progress Notes (Signed)
Subjective:    Patient ID: Derek Schneider, male    DOB: 1925-11-27, 78 y.o.   MRN: [id number]  HPI Derek Schneider is a 78 y.o. male Usual primary care: Timothy Lasso, ENT: Dorma Russell.  Woke up 2 sundays ago - dizziness and off balance off and on.  Notes in the morning, usually eases off during the day, but came back today. Heartbeat off today - skips beats at times - noted in past but only few times. Every third beat today.  Heart raced in past - in middle of night, but told ok from Dr. Donnie Aho due to prebeats.   Vertigo in past, but this is different - feels like brain cloudy, feels like vision is bouncing up and down in the morning. Feels dizziness and cloudiness now. Last had vertigo years ago, but then just room spinning.  No N/V, no slurred speech. No arm or leg weakness.   Hx of acoustic neuroma.  Had mri few months ago - had not grown. Has not caused dizziness in the past.   Cath 07/03/08: mild cad in LAD. Normal LV function.   No new otc meds, rx meds or supplements.    Review of Systems  Eyes: Positive for visual disturbance.       No diplopia.   Cardiovascular: Positive for palpitations. Negative for chest pain.  Neurological: Positive for dizziness and light-headedness. Negative for tremors, facial asymmetry, speech difficulty, weakness, numbness and headaches.       Objective:   Physical Exam  Constitutional: He is oriented to person, place, and time. He appears well-developed and well-nourished.  HENT:  Head: Normocephalic and atraumatic.  Eyes: Pupils are equal, round, and reactive to light. Right eye exhibits nystagmus. Right eye exhibits normal extraocular motion. Left eye exhibits nystagmus (horizontal nystagmus to left - 1 beat. ). Left eye exhibits normal extraocular motion.  Cardiovascular: Normal rate, regular rhythm and normal heart sounds.        No apparent ectopy on exam, but slow rate at 50. (usual per patient).  Pulmonary/Chest: Effort normal and breath sounds  normal.  Neurological: He is alert and oriented to person, place, and time. He has normal strength. No sensory deficit. He displays a negative Romberg sign. Coordination normal.       No appreciable focal weakness, no pronator drift.  Skin: Skin is warm and dry.  Psychiatric: He has a normal mood and affect. His behavior is normal.   Results for orders placed in visit on 07/11/12  POCT CBC      Component Value Range   WBC 7.6  4.6 - 10.2 K/uL   Lymph, poc 2.0  0.6 - 3.4   POC LYMPH PERCENT 26.7  10 - 50 %L   MID (cbc) 0.6  0 - 0.9   POC MID % 8.0  0 - 12 %M   POC Granulocyte 5.0  2 - 6.9   Granulocyte percent 65.3  37 - 80 %G   RBC 4.84  4.69 - 6.13 M/uL   Hemoglobin 14.6  14.1 - 18.1 g/dL   HCT, POC 40.3  47.4 - 53.7 %   MCV 94.5  80 - 97 fL   MCH, POC 30.2  27 - 31.2 pg   MCHC 31.9  31.8 - 35.4 g/dL   RDW, POC 25.9     Platelet Count, POC 177  142 - 424 K/uL   MPV 11.3  0 - 99.8 fL   EKG: Sinus brady, rate 50, nonspecific QRS widening,  but no apparent change from 10/2009 (QRS 117 vs 124), nonspecific st wave changes V2-4, without apparent acute findings.   Recheck 1940: complete clearing of symptoms.    Assessment & Plan:  PARTICK MUSSELMAN is a 78 y.o. male 1. Dizziness  POCT CBC, EKG 12-Lead, Ambulatory referral to Cardiology  2. Palpitations  Ambulatory referral to Cardiology  3. Vertigo  DISCONTINUED: meclizine (ANTIVERT) 25 MG tablet    Hx of vertigo and pvc?s, but initally c/o different symptoms than usual vertigo with " cloudy feeling".  Episodic symptoms for past 2 weeks without any increase in symptoms. Possible pvc's earlier, but clearing of symptoms in office.  No focal weakness on exam. Am symptoms with looking down then up likely d/t vertigo.  Trial of meclizine up to every 8 hours - sed. Follow up at cardiology tomorrow to discuss sx's and possible pvc's.  Er precautions discussed with patient and wife, including any return or worsening of symptoms. Understanding  expressed.

## 2012-07-12 DIAGNOSIS — R0609 Other forms of dyspnea: Secondary | ICD-10-CM | POA: Diagnosis not present

## 2012-07-12 DIAGNOSIS — R002 Palpitations: Secondary | ICD-10-CM | POA: Diagnosis not present

## 2012-07-12 DIAGNOSIS — I4949 Other premature depolarization: Secondary | ICD-10-CM | POA: Diagnosis not present

## 2012-07-12 DIAGNOSIS — I10 Essential (primary) hypertension: Secondary | ICD-10-CM | POA: Diagnosis not present

## 2012-07-12 DIAGNOSIS — E785 Hyperlipidemia, unspecified: Secondary | ICD-10-CM | POA: Diagnosis not present

## 2012-07-12 DIAGNOSIS — I2510 Atherosclerotic heart disease of native coronary artery without angina pectoris: Secondary | ICD-10-CM | POA: Diagnosis not present

## 2012-07-12 DIAGNOSIS — R42 Dizziness and giddiness: Secondary | ICD-10-CM | POA: Diagnosis not present

## 2012-08-25 ENCOUNTER — Other Ambulatory Visit: Payer: Self-pay | Admitting: Physician Assistant

## 2012-08-25 DIAGNOSIS — D485 Neoplasm of uncertain behavior of skin: Secondary | ICD-10-CM | POA: Diagnosis not present

## 2012-08-25 DIAGNOSIS — L570 Actinic keratosis: Secondary | ICD-10-CM | POA: Diagnosis not present

## 2012-12-02 DIAGNOSIS — D333 Benign neoplasm of cranial nerves: Secondary | ICD-10-CM | POA: Diagnosis not present

## 2012-12-02 DIAGNOSIS — H903 Sensorineural hearing loss, bilateral: Secondary | ICD-10-CM | POA: Diagnosis not present

## 2012-12-02 DIAGNOSIS — H81319 Aural vertigo, unspecified ear: Secondary | ICD-10-CM | POA: Diagnosis not present

## 2012-12-02 DIAGNOSIS — H905 Unspecified sensorineural hearing loss: Secondary | ICD-10-CM | POA: Diagnosis not present

## 2012-12-02 DIAGNOSIS — H9319 Tinnitus, unspecified ear: Secondary | ICD-10-CM | POA: Diagnosis not present

## 2013-01-18 DIAGNOSIS — E785 Hyperlipidemia, unspecified: Secondary | ICD-10-CM | POA: Diagnosis not present

## 2013-01-18 DIAGNOSIS — R42 Dizziness and giddiness: Secondary | ICD-10-CM | POA: Diagnosis not present

## 2013-01-18 DIAGNOSIS — I10 Essential (primary) hypertension: Secondary | ICD-10-CM | POA: Diagnosis not present

## 2013-01-18 DIAGNOSIS — I2510 Atherosclerotic heart disease of native coronary artery without angina pectoris: Secondary | ICD-10-CM | POA: Diagnosis not present

## 2013-01-18 DIAGNOSIS — I4949 Other premature depolarization: Secondary | ICD-10-CM | POA: Diagnosis not present

## 2013-02-15 DIAGNOSIS — N200 Calculus of kidney: Secondary | ICD-10-CM | POA: Diagnosis not present

## 2013-02-15 DIAGNOSIS — N509 Disorder of male genital organs, unspecified: Secondary | ICD-10-CM | POA: Diagnosis not present

## 2013-02-15 DIAGNOSIS — N401 Enlarged prostate with lower urinary tract symptoms: Secondary | ICD-10-CM | POA: Diagnosis not present

## 2013-03-25 DIAGNOSIS — Z125 Encounter for screening for malignant neoplasm of prostate: Secondary | ICD-10-CM | POA: Diagnosis not present

## 2013-03-25 DIAGNOSIS — E785 Hyperlipidemia, unspecified: Secondary | ICD-10-CM | POA: Diagnosis not present

## 2013-03-25 DIAGNOSIS — I10 Essential (primary) hypertension: Secondary | ICD-10-CM | POA: Diagnosis not present

## 2013-03-25 DIAGNOSIS — N189 Chronic kidney disease, unspecified: Secondary | ICD-10-CM | POA: Diagnosis not present

## 2013-03-29 DIAGNOSIS — Z1212 Encounter for screening for malignant neoplasm of rectum: Secondary | ICD-10-CM | POA: Diagnosis not present

## 2013-03-31 ENCOUNTER — Telehealth: Payer: Self-pay | Admitting: Gastroenterology

## 2013-03-31 DIAGNOSIS — Z0000 Encounter for general adult medical examination without abnormal findings: Secondary | ICD-10-CM | POA: Diagnosis not present

## 2013-03-31 DIAGNOSIS — Z1331 Encounter for screening for depression: Secondary | ICD-10-CM | POA: Diagnosis not present

## 2013-03-31 DIAGNOSIS — I4949 Other premature depolarization: Secondary | ICD-10-CM | POA: Diagnosis not present

## 2013-03-31 DIAGNOSIS — I2510 Atherosclerotic heart disease of native coronary artery without angina pectoris: Secondary | ICD-10-CM | POA: Diagnosis not present

## 2013-03-31 DIAGNOSIS — D333 Benign neoplasm of cranial nerves: Secondary | ICD-10-CM | POA: Diagnosis not present

## 2013-03-31 DIAGNOSIS — N509 Disorder of male genital organs, unspecified: Secondary | ICD-10-CM | POA: Diagnosis not present

## 2013-03-31 DIAGNOSIS — C4491 Basal cell carcinoma of skin, unspecified: Secondary | ICD-10-CM | POA: Diagnosis not present

## 2013-03-31 DIAGNOSIS — R945 Abnormal results of liver function studies: Secondary | ICD-10-CM | POA: Diagnosis not present

## 2013-03-31 DIAGNOSIS — J45909 Unspecified asthma, uncomplicated: Secondary | ICD-10-CM | POA: Diagnosis not present

## 2013-03-31 MED ORDER — DEXLANSOPRAZOLE 60 MG PO CPDR: 60.0000 mg | DELAYED_RELEASE_CAPSULE | Freq: Every day | ORAL | Status: DC

## 2013-03-31 NOTE — Telephone Encounter (Signed)
RX sent Patient will need an office visit for further refills

## 2013-04-07 ENCOUNTER — Other Ambulatory Visit: Payer: Self-pay | Admitting: Otolaryngology

## 2013-04-07 DIAGNOSIS — H81319 Aural vertigo, unspecified ear: Secondary | ICD-10-CM

## 2013-04-07 DIAGNOSIS — D333 Benign neoplasm of cranial nerves: Secondary | ICD-10-CM

## 2013-04-13 ENCOUNTER — Ambulatory Visit
Admission: RE | Admit: 2013-04-13 | Discharge: 2013-04-13 | Disposition: A | Discharge status: Home / Self Care | Payer: Medicare Other | Source: Ambulatory Visit | Attending: Otolaryngology | Admitting: Otolaryngology

## 2013-04-13 DIAGNOSIS — D333 Benign neoplasm of cranial nerves: Secondary | ICD-10-CM

## 2013-04-13 DIAGNOSIS — H81319 Aural vertigo, unspecified ear: Secondary | ICD-10-CM

## 2013-04-13 DIAGNOSIS — D210 Benign neoplasm of connective and other soft tissue of head, face and neck: Secondary | ICD-10-CM | POA: Diagnosis not present

## 2013-04-13 MED ORDER — GADOBENATE DIMEGLUMINE 529 MG/ML IV SOLN
15.0000 mL | Freq: Once | INTRAVENOUS | Status: AC | PRN
  Administered 2013-04-13: 15 mL via INTRAVENOUS

## 2013-07-15 ENCOUNTER — Telehealth: Payer: Self-pay | Admitting: Gastroenterology

## 2013-07-15 MED ORDER — DEXLANSOPRAZOLE 60 MG PO CPDR: 60.0000 mg | DELAYED_RELEASE_CAPSULE | Freq: Every day | ORAL | Status: DC

## 2013-07-15 NOTE — Telephone Encounter (Signed)
Patient scheduled follow up appointment Patient asked for samples to hold him over until his appointment on 10-24 because he is about out and mail order will take to long I advised patient I have enough samples to get him through til his next appointment and patient verbalized understanding that he must keep appointment for further refills  Patient will be here today

## 2013-08-05 ENCOUNTER — Ambulatory Visit (INDEPENDENT_AMBULATORY_CARE_PROVIDER_SITE_OTHER): Payer: Medicare Other | Admitting: Gastroenterology

## 2013-08-05 ENCOUNTER — Encounter: Payer: Self-pay | Admitting: Gastroenterology

## 2013-08-05 VITALS — BP 160/70 | HR 52 | Ht 67.72 in | Wt 180.4 lb

## 2013-08-05 DIAGNOSIS — K219 Gastro-esophageal reflux disease without esophagitis: Secondary | ICD-10-CM

## 2013-08-05 DIAGNOSIS — Z1211 Encounter for screening for malignant neoplasm of colon: Secondary | ICD-10-CM | POA: Diagnosis not present

## 2013-08-05 MED ORDER — DEXLANSOPRAZOLE 60 MG PO CPDR: 60.0000 mg | DELAYED_RELEASE_CAPSULE | Freq: Every day | ORAL | Status: DC

## 2013-08-05 NOTE — Progress Notes (Signed)
  This is a 79 year old Caucasian male who has had chronic GERD for many years and is up-to-date on his endoscopy and colonoscopies.  Also in the past as had multiple colon polyps removed.  He currently is asymptomatic on Dexilant 60 mg a day.  He has mild chronic functional constipation but denies melena or hematochezia, chest pain or dysphagia.  He is followed closely by Dr. Creola Corn.  Current Medications, Allergies, Past Medical History, Past Surgical History, Family History and Social History were reviewed in [employer] record.  ROS: All systems were reviewed and are negative unless otherwise stated in the HPI.          Physical Exam: Blood pressure 160/70, pulse 52 and regular and weight 180.  Abdominal exam shows no organomegaly, masses or tenderness.  Bowel sounds are normal.  Mental status   Assessment and Plan: Reflux regime review with patient and I renewed his Dexilant.  We will have him do IFOB stool cards for exam to determine if he needs followup colonoscopy.  He is age 79 but appears to be in fairly good health.  As mentioned above, he has had multiple colon polyps in the past.  I've asked him to use Benefiber 1 tablespoon twice a day in his food.

## 2013-08-05 NOTE — Patient Instructions (Addendum)
Please follow up in one year  New prescription for Dexilant was sent to your pharmacy   Please purchase Benefiber over the counter and take one tablespoon by mouth twice daily   Your physician has requested that you go to the basement for the following lab work before leaving today: IFOB

## 2013-08-12 DIAGNOSIS — Z23 Encounter for immunization: Secondary | ICD-10-CM | POA: Diagnosis not present

## 2013-09-25 ENCOUNTER — Ambulatory Visit: Payer: Medicare Other

## 2013-09-25 ENCOUNTER — Ambulatory Visit (INDEPENDENT_AMBULATORY_CARE_PROVIDER_SITE_OTHER): Payer: Medicare Other | Admitting: Emergency Medicine

## 2013-09-25 VITALS — BP 118/66 | HR 82 | Temp 97.3°F | Resp 16 | Ht 67.2 in | Wt 182.2 lb

## 2013-09-25 DIAGNOSIS — S92309A Fracture of unspecified metatarsal bone(s), unspecified foot, initial encounter for closed fracture: Secondary | ICD-10-CM

## 2013-09-25 DIAGNOSIS — S92302A Fracture of unspecified metatarsal bone(s), left foot, initial encounter for closed fracture: Secondary | ICD-10-CM

## 2013-09-25 DIAGNOSIS — M25572 Pain in left ankle and joints of left foot: Secondary | ICD-10-CM

## 2013-09-25 DIAGNOSIS — IMO0002 Reserved for concepts with insufficient information to code with codable children: Secondary | ICD-10-CM | POA: Diagnosis not present

## 2013-09-25 DIAGNOSIS — M25579 Pain in unspecified ankle and joints of unspecified foot: Secondary | ICD-10-CM | POA: Diagnosis not present

## 2013-09-25 DIAGNOSIS — T148XXA Other injury of unspecified body region, initial encounter: Secondary | ICD-10-CM

## 2013-09-25 MED ORDER — HYDROCODONE-ACETAMINOPHEN 5-325 MG PO TABS: 1.0000 | ORAL_TABLET | ORAL | Status: DC | PRN

## 2013-09-25 NOTE — Progress Notes (Signed)
[facility] [address], [place] [address] [phone]  Date:  09/25/2013   Name:  Derek Schneider   DOB:  10-Sep-1926   MRN:  [id number]  PCP:  Gwen Pounds, MD    Chief Complaint: Foot Pain and Abrasion   History of Present Illness:  Derek Schneider is a 79 y.o. very pleasant male patient who presents with the following:  Climbing a ladder inside the house ascending to the attic.  The foot of the step slid and he fell feet first to the ground.  Has an abrasion to his right upper arm and is current on TD.  Denies injury ot head, neck or back.  Has pain,swelling and ecchymosis left dorsal foot.  Has pain with ambulation and standing that resolves with elevation and ice.  No improvement with over the counter medications or other home remedies. Denies other complaint or health concern today.   Patient Active Problem List   Diagnosis Date Noted  . Chest pain, atypical 11/17/2011  . Hx of gastroesophageal reflux (GERD) 11/17/2011  . CHEST PAIN 11/27/2009  . GERD 06/29/2008  . HYPERTENSION 06/28/2008  . INTERNAL HEMORRHOIDS 06/28/2008  . BRONCHITIS 06/28/2008  . ESOPHAGEAL STRICTURE 06/28/2008  . GASTRITIS 06/28/2008  . HIATAL HERNIA 06/28/2008  . DIVERTICULOSIS, COLON 06/28/2008  . COLONIC POLYPS, HX OF 06/28/2008    Past Medical History  Diagnosis Date  . Bronchitis, not specified as acute or chronic   . Unspecified essential hypertension   . Internal hemorrhoids without mention of complication   . Stricture and stenosis of esophagus   . Unspecified gastritis and gastroduodenitis without mention of hemorrhage   . Diverticulosis of colon (without mention of hemorrhage)   . Personal history of colonic polyps 01/12/2008    TUBULOVILLOUS ADENOMA  . Hiatal hernia   . PVC (premature ventricular contraction)     Past Surgical History  Procedure Laterality Date  . Testiclar cyst excision    . Knee surgery  as a child    left patella removal as a  child  . Tonsillectomy    . Cardiac catheterization      History  Substance Use Topics  . Smoking status: Never Smoker   . Smokeless tobacco: Never Used  . Alcohol Use: No    Family History  Problem Relation Age of Onset  . Lung cancer Brother   . Heart disease Father   . Kidney disease Father   . Cancer Brother     No Known Allergies  Medication list has been reviewed and updated.  Current Outpatient Prescriptions on File Prior to Visit  Medication Sig Dispense Refill  . aspirin 81 MG tablet Take 81 mg by mouth every other day.       Marland Kitchen CARAFATE 1 GM/10ML suspension TAKE TWO TEASPOONFULS BY MOUTH EVERY TWO TO FOUR HOURS AS NEEDED  1200 mL  0  . dexlansoprazole (DEXILANT) 60 MG capsule Take 1 capsule (60 mg total) by mouth daily.  90 capsule  3  . ezetimibe-simvastatin (VYTORIN) 10-20 MG per tablet Take 1 tablet by mouth at bedtime.      . finasteride (PROSCAR) 5 MG tablet Take 5 mg by mouth daily.      Marland Kitchen losartan-hydrochlorothiazide (HYZAAR) 100-25 MG per tablet Take 1 tablet by mouth daily.       Marland Kitchen NIFEdipine (PROCARDIA XL/ADALAT-CC) 30 MG 24 hr tablet Take 30 mg by mouth daily.      . Tamsulosin HCl (FLOMAX) 0.4  MG CAPS Take 0.4 mg by mouth daily.       No current facility-administered medications on file prior to visit.    Review of Systems:  As per HPI, otherwise negative.    Physical Examination: Filed Vitals:   09/25/13 1657  BP: 118/66  Pulse: 82  Temp: 97.3 F (36.3 C)  Resp: 16   Filed Vitals:   09/25/13 1657  Height: 5' 7.2" (1.707 m)  Weight: 182 lb 3.2 oz (82.645 kg)   Body mass index is 28.36 kg/(m^2). Ideal Body Weight: Weight in (lb) to have BMI = 25: 160.2   GEN: WDWN, NAD, Non-toxic, Alert & Oriented x 3 HEENT: Atraumatic, Normocephalic.  Ears and Nose: No external deformity. EXTR: No clubbing/cyanosis/edema NEURO: Normal gait.  PSYCH: Normally interactive. Conversant. Not depressed or anxious appearing.  Calm demeanor.  LEFT foot:   Swollen, tender, and ecchymotic dorsum.  No deformity.   RIGHT arm:  Abrasion upper arm  Assessment and Plan: Fracture left fifth MT Posterior splint Crutches Ortho referral Abrasion left arm   Signed,  Phillips Odor, MD   [facility] reading (PRIMARY) by  Dr. Dareen Piano.  Comminuted displaced fracture fifth metatarsal .

## 2013-09-25 NOTE — Patient Instructions (Signed)
Metatarsal Fracture, Undisplaced A metatarsal fracture is a break in the bone(s) of the foot. These are the bones of the foot that connect your toes to the bones of the ankle. DIAGNOSIS  The diagnoses of these fractures are usually made with X-rays. If there are problems in the forefoot and x-rays are normal a later bone scan will usually make the diagnosis.  TREATMENT AND HOME CARE INSTRUCTIONS  Treatment may or may not include a cast or walking shoe. When casts are needed the use is usually for short periods of time so as not to slow down healing with muscle wasting (atrophy).  Activities should be stopped until further advised by your caregiver.  Wear shoes with adequate shock absorbing capabilities and stiff soles.  Alternative exercise may be undertaken while waiting for healing. These may include bicycling and swimming, or as your caregiver suggests.  It is important to keep all follow-up visits or specialty referrals. The failure to keep these appointments could result in improper bone healing and chronic pain or disability.  Warning: Do not drive a car or operate a motor vehicle until your caregiver specifically tells you it is safe to do so. IF YOU DO NOT HAVE A CAST OR SPLINT:  You may walk on your injured foot as tolerated or advised.  Do not put any weight on your injured foot for as long as directed by your caregiver. Slowly increase the amount of time you walk on the foot as the pain allows or as advised.  Use crutches until you can bear weight without pain. A gradual increase in weight bearing may help.  Apply ice to the injury for 15-20 minutes each hour while awake for the first 2 days. Put the ice in a plastic bag and place a towel between the bag of ice and your skin.  Only take over-the-counter or prescription medicines for pain, discomfort, or fever as directed by your caregiver. SEEK IMMEDIATE MEDICAL CARE IF:   Your cast gets damaged or breaks.  You have  continued severe pain or more swelling than you did before the cast was put on, or the pain is not controlled with medications.  Your skin or nails below the injury turn blue or grey, or feel cold or numb.  There is a bad smell, or new stains or pus-like (purulent) drainage coming from the cast. MAKE SURE YOU:   Understand these instructions.  Will watch your condition.  Will get help right away if you are not doing well or get worse. Document Released: 06/21/2002 Document Revised: 12/22/2011 Document Reviewed: 05/12/2008 Exit[employer] Patient Information 2014 [employer].  

## 2013-09-25 NOTE — Addendum Note (Signed)
Addended by: Thelma Barge D on: 09/25/2013 06:42 PM   Modules accepted: Orders

## 2013-09-26 ENCOUNTER — Telehealth: Payer: Self-pay | Admitting: Gastroenterology

## 2013-09-26 DIAGNOSIS — S92309A Fracture of unspecified metatarsal bone(s), unspecified foot, initial encounter for closed fracture: Secondary | ICD-10-CM | POA: Diagnosis not present

## 2013-09-27 DIAGNOSIS — Y999 Unspecified external cause status: Secondary | ICD-10-CM | POA: Diagnosis not present

## 2013-09-27 DIAGNOSIS — S92309A Fracture of unspecified metatarsal bone(s), unspecified foot, initial encounter for closed fracture: Secondary | ICD-10-CM | POA: Diagnosis not present

## 2013-09-27 DIAGNOSIS — Y939 Activity, unspecified: Secondary | ICD-10-CM | POA: Diagnosis not present

## 2013-09-27 DIAGNOSIS — W11XXXA Fall on and from ladder, initial encounter: Secondary | ICD-10-CM | POA: Diagnosis not present

## 2013-09-27 DIAGNOSIS — G8918 Other acute postprocedural pain: Secondary | ICD-10-CM | POA: Diagnosis not present

## 2013-09-27 DIAGNOSIS — Y929 Unspecified place or not applicable: Secondary | ICD-10-CM | POA: Diagnosis not present

## 2013-09-27 NOTE — Telephone Encounter (Signed)
Pt reports he is having foot surgery today and forgot and took his Dexilant. He states ortho told him to not to eat, but takes his Dexilant. Informed him he should be OK for a one time episode. Pt states Dr Jarold Motto instructed him never to take the Dexilant w/o eating something later.

## 2013-10-10 DIAGNOSIS — S8290XD Unspecified fracture of unspecified lower leg, subsequent encounter for closed fracture with routine healing: Secondary | ICD-10-CM | POA: Diagnosis not present

## 2013-10-10 DIAGNOSIS — S92309A Fracture of unspecified metatarsal bone(s), unspecified foot, initial encounter for closed fracture: Secondary | ICD-10-CM | POA: Diagnosis not present

## 2013-10-26 DIAGNOSIS — Z4789 Encounter for other orthopedic aftercare: Secondary | ICD-10-CM | POA: Diagnosis not present

## 2013-10-26 DIAGNOSIS — S92309A Fracture of unspecified metatarsal bone(s), unspecified foot, initial encounter for closed fracture: Secondary | ICD-10-CM | POA: Diagnosis not present

## 2013-11-18 DIAGNOSIS — S8290XD Unspecified fracture of unspecified lower leg, subsequent encounter for closed fracture with routine healing: Secondary | ICD-10-CM | POA: Diagnosis not present

## 2013-11-18 DIAGNOSIS — S92309A Fracture of unspecified metatarsal bone(s), unspecified foot, initial encounter for closed fracture: Secondary | ICD-10-CM | POA: Diagnosis not present

## 2013-11-18 DIAGNOSIS — L259 Unspecified contact dermatitis, unspecified cause: Secondary | ICD-10-CM | POA: Diagnosis not present

## 2013-11-18 DIAGNOSIS — L570 Actinic keratosis: Secondary | ICD-10-CM | POA: Diagnosis not present

## 2013-12-21 DIAGNOSIS — H81319 Aural vertigo, unspecified ear: Secondary | ICD-10-CM | POA: Diagnosis not present

## 2013-12-21 DIAGNOSIS — H905 Unspecified sensorineural hearing loss: Secondary | ICD-10-CM | POA: Diagnosis not present

## 2013-12-21 DIAGNOSIS — H903 Sensorineural hearing loss, bilateral: Secondary | ICD-10-CM | POA: Diagnosis not present

## 2013-12-21 DIAGNOSIS — H9319 Tinnitus, unspecified ear: Secondary | ICD-10-CM | POA: Diagnosis not present

## 2013-12-21 DIAGNOSIS — D333 Benign neoplasm of cranial nerves: Secondary | ICD-10-CM | POA: Diagnosis not present

## 2013-12-23 DIAGNOSIS — M7989 Other specified soft tissue disorders: Secondary | ICD-10-CM | POA: Diagnosis not present

## 2013-12-23 DIAGNOSIS — S8290XD Unspecified fracture of unspecified lower leg, subsequent encounter for closed fracture with routine healing: Secondary | ICD-10-CM | POA: Diagnosis not present

## 2013-12-23 DIAGNOSIS — Z4789 Encounter for other orthopedic aftercare: Secondary | ICD-10-CM | POA: Diagnosis not present

## 2014-01-24 DIAGNOSIS — I4949 Other premature depolarization: Secondary | ICD-10-CM | POA: Diagnosis not present

## 2014-01-24 DIAGNOSIS — I2510 Atherosclerotic heart disease of native coronary artery without angina pectoris: Secondary | ICD-10-CM | POA: Diagnosis not present

## 2014-01-24 DIAGNOSIS — I10 Essential (primary) hypertension: Secondary | ICD-10-CM | POA: Diagnosis not present

## 2014-01-24 DIAGNOSIS — E785 Hyperlipidemia, unspecified: Secondary | ICD-10-CM | POA: Diagnosis not present

## 2014-02-16 DIAGNOSIS — N401 Enlarged prostate with lower urinary tract symptoms: Secondary | ICD-10-CM | POA: Diagnosis not present

## 2014-02-16 DIAGNOSIS — N139 Obstructive and reflux uropathy, unspecified: Secondary | ICD-10-CM | POA: Diagnosis not present

## 2014-02-16 DIAGNOSIS — R339 Retention of urine, unspecified: Secondary | ICD-10-CM | POA: Diagnosis not present

## 2014-03-27 ENCOUNTER — Other Ambulatory Visit: Payer: Self-pay | Admitting: Otolaryngology

## 2014-03-27 DIAGNOSIS — D333 Benign neoplasm of cranial nerves: Secondary | ICD-10-CM

## 2014-03-30 DIAGNOSIS — N183 Chronic kidney disease, stage 3 unspecified: Secondary | ICD-10-CM | POA: Diagnosis not present

## 2014-03-30 DIAGNOSIS — Z79899 Other long term (current) drug therapy: Secondary | ICD-10-CM | POA: Diagnosis not present

## 2014-03-30 DIAGNOSIS — I10 Essential (primary) hypertension: Secondary | ICD-10-CM | POA: Diagnosis not present

## 2014-03-30 DIAGNOSIS — E785 Hyperlipidemia, unspecified: Secondary | ICD-10-CM | POA: Diagnosis not present

## 2014-03-30 DIAGNOSIS — I2510 Atherosclerotic heart disease of native coronary artery without angina pectoris: Secondary | ICD-10-CM | POA: Diagnosis not present

## 2014-03-30 DIAGNOSIS — Z125 Encounter for screening for malignant neoplasm of prostate: Secondary | ICD-10-CM | POA: Diagnosis not present

## 2014-04-07 DIAGNOSIS — Z1212 Encounter for screening for malignant neoplasm of rectum: Secondary | ICD-10-CM | POA: Diagnosis not present

## 2014-04-11 DIAGNOSIS — N401 Enlarged prostate with lower urinary tract symptoms: Secondary | ICD-10-CM | POA: Diagnosis not present

## 2014-04-11 DIAGNOSIS — I10 Essential (primary) hypertension: Secondary | ICD-10-CM | POA: Diagnosis not present

## 2014-04-11 DIAGNOSIS — R42 Dizziness and giddiness: Secondary | ICD-10-CM | POA: Diagnosis not present

## 2014-04-11 DIAGNOSIS — I2510 Atherosclerotic heart disease of native coronary artery without angina pectoris: Secondary | ICD-10-CM | POA: Diagnosis not present

## 2014-04-11 DIAGNOSIS — Z0000 Encounter for general adult medical examination without abnormal findings: Secondary | ICD-10-CM | POA: Diagnosis not present

## 2014-04-11 DIAGNOSIS — I4949 Other premature depolarization: Secondary | ICD-10-CM | POA: Diagnosis not present

## 2014-04-11 DIAGNOSIS — D333 Benign neoplasm of cranial nerves: Secondary | ICD-10-CM | POA: Diagnosis not present

## 2014-04-11 DIAGNOSIS — Z1331 Encounter for screening for depression: Secondary | ICD-10-CM | POA: Diagnosis not present

## 2014-04-11 DIAGNOSIS — E785 Hyperlipidemia, unspecified: Secondary | ICD-10-CM | POA: Diagnosis not present

## 2014-04-18 ENCOUNTER — Ambulatory Visit
Admission: RE | Admit: 2014-04-18 | Discharge: 2014-04-18 | Disposition: A | Discharge status: Home / Self Care | Payer: Medicare Other | Source: Ambulatory Visit | Attending: Otolaryngology | Admitting: Otolaryngology

## 2014-04-18 DIAGNOSIS — D333 Benign neoplasm of cranial nerves: Secondary | ICD-10-CM | POA: Diagnosis not present

## 2014-04-18 MED ORDER — GADOBENATE DIMEGLUMINE 529 MG/ML IV SOLN
8.0000 mL | Freq: Once | INTRAVENOUS | Status: AC | PRN
  Administered 2014-04-18: 8 mL via INTRAVENOUS

## 2014-04-25 ENCOUNTER — Encounter: Payer: Self-pay | Admitting: Gastroenterology

## 2014-05-01 DIAGNOSIS — I2510 Atherosclerotic heart disease of native coronary artery without angina pectoris: Secondary | ICD-10-CM | POA: Diagnosis not present

## 2014-05-01 DIAGNOSIS — E785 Hyperlipidemia, unspecified: Secondary | ICD-10-CM | POA: Diagnosis not present

## 2014-05-01 DIAGNOSIS — R002 Palpitations: Secondary | ICD-10-CM | POA: Diagnosis not present

## 2014-05-01 DIAGNOSIS — I4949 Other premature depolarization: Secondary | ICD-10-CM | POA: Diagnosis not present

## 2014-05-01 DIAGNOSIS — I10 Essential (primary) hypertension: Secondary | ICD-10-CM | POA: Diagnosis not present

## 2014-05-13 ENCOUNTER — Telehealth: Payer: Self-pay | Admitting: Nurse Practitioner

## 2014-05-13 NOTE — Telephone Encounter (Signed)
Robin from [facility] called to report that event monitor for Derek Schneider showed 11 beats of NSVT.  No report of symptoms.  Report will be faxed to Dr. Thurman Coyer office.

## 2014-06-05 DIAGNOSIS — I4949 Other premature depolarization: Secondary | ICD-10-CM | POA: Diagnosis not present

## 2014-06-05 DIAGNOSIS — E785 Hyperlipidemia, unspecified: Secondary | ICD-10-CM | POA: Diagnosis not present

## 2014-06-05 DIAGNOSIS — I10 Essential (primary) hypertension: Secondary | ICD-10-CM | POA: Diagnosis not present

## 2014-06-05 DIAGNOSIS — I2510 Atherosclerotic heart disease of native coronary artery without angina pectoris: Secondary | ICD-10-CM | POA: Diagnosis not present

## 2014-07-10 DIAGNOSIS — Z23 Encounter for immunization: Secondary | ICD-10-CM | POA: Diagnosis not present

## 2014-07-10 DIAGNOSIS — N183 Chronic kidney disease, stage 3 unspecified: Secondary | ICD-10-CM | POA: Diagnosis not present

## 2014-07-11 ENCOUNTER — Encounter: Payer: Self-pay | Admitting: Gastroenterology

## 2014-07-11 ENCOUNTER — Ambulatory Visit (INDEPENDENT_AMBULATORY_CARE_PROVIDER_SITE_OTHER): Payer: Medicare Other | Admitting: Gastroenterology

## 2014-07-11 VITALS — BP 128/62 | HR 60 | Ht 68.0 in | Wt 181.0 lb

## 2014-07-11 DIAGNOSIS — Z86010 Personal history of colonic polyps: Secondary | ICD-10-CM | POA: Diagnosis not present

## 2014-07-11 NOTE — Patient Instructions (Signed)
dexilant samples given if we have any If you have any significant changes in her bowels such as bleeding, constipation or diarrhea that is new, or any weight loss is unexplained then you should please call

## 2014-07-11 NOTE — Progress Notes (Signed)
Review of pertinent gastrointestinal problems: 1. adenomatous colon polyps. Several colonoscopies by Dr. Verl Blalock, the first was in 2002. He has had multiple adenomatous polyps removed from his colon over the years. He had a colonoscopy in 2009, a cecal 1.7 tubular villous adenoma was removed as well as another polyp in his colon. Followup colonoscopy one year later showed no residual adenomatous polyps and he was recommended to have repeat colonoscopy at 5 year interval, which would be at the age of 70.    HPI: This is a  very pleasant 80 year old man who will be turning 88 next week.  He has had multiple colon polyps removed over the years by Dr. Verl Blalock see that summarized above. He has had no change in his bowels, no overt bleeding, no unexplained weight loss. He tells me he has annual Hemoccult testing done by his primary care physician and that it has always been negative.      Review of systems: Pertinent positive and negative review of systems were noted in the above HPI section. Complete review of systems was performed and was otherwise normal.    Past Medical History  Diagnosis Date  . Bronchitis, not specified as acute or chronic   . Unspecified essential hypertension   . Internal hemorrhoids without mention of complication   . Stricture and stenosis of esophagus   . Unspecified gastritis and gastroduodenitis without mention of hemorrhage   . Diverticulosis of colon (without mention of hemorrhage)   . Personal history of colonic polyps 01/12/2008    TUBULOVILLOUS ADENOMA  . Hiatal hernia   . PVC (premature ventricular contraction)     Past Surgical History  Procedure Laterality Date  . Testiclar cyst excision    . Knee surgery  as a child    left patella removal as a child  . Tonsillectomy    . Cardiac catheterization      Current Outpatient Prescriptions  Medication Sig Dispense Refill  . aspirin 81 MG tablet Take 81 mg by mouth every other day.        Marland Kitchen CARAFATE 1 GM/10ML suspension TAKE TWO TEASPOONFULS BY MOUTH EVERY TWO TO FOUR HOURS AS NEEDED  1200 mL  0  . dexlansoprazole (DEXILANT) 60 MG capsule Take 1 capsule (60 mg total) by mouth daily.  90 capsule  3  . ezetimibe-simvastatin (VYTORIN) 10-20 MG per tablet Take 1 tablet by mouth at bedtime.      . finasteride (PROSCAR) 5 MG tablet Take 5 mg by mouth daily.      Marland Kitchen HYDROcodone-acetaminophen (NORCO) 5-325 MG per tablet Take 1-2 tablets by mouth every 4 (four) hours as needed for moderate pain.  30 tablet  0  . losartan-hydrochlorothiazide (HYZAAR) 100-25 MG per tablet Take 1 tablet by mouth daily.       Marland Kitchen NIFEdipine (PROCARDIA XL/ADALAT-CC) 30 MG 24 hr tablet Take 30 mg by mouth daily.      . Tamsulosin HCl (FLOMAX) 0.4 MG CAPS Take 0.4 mg by mouth daily.       No current facility-administered medications for this visit.    Allergies as of 07/11/2014  . (No Known Allergies)    Family History  Problem Relation Age of Onset  . Lung cancer Brother   . Heart disease Father   . Kidney disease Father   . Cancer Brother     History   Social History  . Marital Status: Married    Spouse Name: N/A    Number of Children: 5  .  Years of Education: N/A   Occupational History  . Self employed    Social History Main Topics  . Smoking status: Never Smoker   . Smokeless tobacco: Never Used  . Alcohol Use: No  . Drug Use: No  . Sexual Activity: Not on file   Other Topics Concern  . Not on file   Social History Narrative  . No narrative on file       Physical Exam: There were no vitals taken for this visit. Constitutional: generally well-appearing Psychiatric: alert and oriented x3 Eyes: extraocular movements intact Mouth: oral pharynx moist, no lesions Neck: supple no lymphadenopathy Cardiovascular: heart regular rate and rhythm Lungs: clear to auscultation bilaterally Abdomen: soft, nontender, nondistended, no obvious ascites, no peritoneal signs, normal bowel  sounds Extremities: no lower extremity edema bilaterally Skin: no lesions on visible extremities    Assessment and plan: 80 y.o. male with  personal history of adenomatous polyps, advanced age  I explained to him that colon cancer screening and polyp surveillance it usually tends around age of 89 or 5. He does understand that but that does not mean we would 10 nor any symptoms if they arise. Specifically I mentioned significant change in his bowel habits, overt GI bleeding, unexplained weight loss. Barring any of those symptoms I recommended against polyp surveillance and further colon cancer colon polyp screening.

## 2014-08-22 DIAGNOSIS — Z23 Encounter for immunization: Secondary | ICD-10-CM | POA: Diagnosis not present

## 2014-09-05 ENCOUNTER — Ambulatory Visit (INDEPENDENT_AMBULATORY_CARE_PROVIDER_SITE_OTHER): Payer: Medicare Other | Admitting: Family Medicine

## 2014-09-05 ENCOUNTER — Telehealth: Payer: Self-pay | Admitting: *Deleted

## 2014-09-05 VITALS — BP 128/60 | HR 57 | Temp 97.8°F | Resp 16

## 2014-09-05 DIAGNOSIS — S0081XA Abrasion of other part of head, initial encounter: Secondary | ICD-10-CM

## 2014-09-05 DIAGNOSIS — S61401A Unspecified open wound of right hand, initial encounter: Secondary | ICD-10-CM

## 2014-09-05 DIAGNOSIS — S20211A Contusion of right front wall of thorax, initial encounter: Secondary | ICD-10-CM

## 2014-09-05 DIAGNOSIS — W010XXA Fall on same level from slipping, tripping and stumbling without subsequent striking against object, initial encounter: Secondary | ICD-10-CM

## 2014-09-05 MED ORDER — MUPIROCIN 2 % EX OINT: 1.0000 "application " | TOPICAL_OINTMENT | Freq: Three times a day (TID) | CUTANEOUS | Status: DC

## 2014-09-05 NOTE — Progress Notes (Signed)
Subjective: Patient works at [employer]. He came out and was walking with his wife and tripped over a stone landing straight forward. He had a little cut on his right forearm, a contusion to his left chest wall, and an abrasion of his forehead. He did not have any loss of consciousness. This happened about a half hour ago and he came here.  Objective: He is alert and oriented. Abrasion on left forehead about 2 x 4 cm. No laceration. He has scattered little petechia around it where he hit hard. No loss of consciousness. He is fully alert and oriented. His left chest wall is a little tender above the left breast, does not feel tender enough to have broken any bones. No bruises noted. His right thumb has a small V-shaped laceration 0.5 x 0.5 cm. I clipped the little tiny tab of dirty skin from the tip of the V. It is very superficial.  Assessment: Forehead abrasion Chest wall contusion Small laceration of hand  Plan: Local care. Wife will observe him for any other problems.

## 2014-09-05 NOTE — Patient Instructions (Addendum)
Use Bactroban on the forehead and wash and dress it twice daily  Keep some Bactroban on the wound on the thumb  If the chest wall gets worse please return  Take Tylenol if needed for pain  Return if any concern of mental status changes from the fall. If problems during the evening or nighttime when we are close to go to the emergency room.

## 2014-09-05 NOTE — Telephone Encounter (Signed)
Please call and ask pt when his last tetanus immunization was. Pt had abrasion evaluated and Dr. Linna Darner would like [place] to call and ask. Please document in chart and if pt has not had one in the past 5 years need to advise to RTC.

## 2014-09-06 NOTE — Telephone Encounter (Signed)
Dr. Virgina Jock stated that pt is up to date and he has an appt on Monday with his PCP.

## 2014-09-08 ENCOUNTER — Ambulatory Visit (INDEPENDENT_AMBULATORY_CARE_PROVIDER_SITE_OTHER): Payer: Medicare Other | Admitting: Emergency Medicine

## 2014-09-08 VITALS — BP 120/70 | HR 57 | Temp 97.3°F | Resp 18 | Ht 67.5 in | Wt 190.0 lb

## 2014-09-08 DIAGNOSIS — R079 Chest pain, unspecified: Secondary | ICD-10-CM | POA: Diagnosis not present

## 2014-09-08 DIAGNOSIS — S20211A Contusion of right front wall of thorax, initial encounter: Secondary | ICD-10-CM

## 2014-09-08 MED ORDER — ACETAMINOPHEN-CODEINE #3 300-30 MG PO TABS: 1.0000 | ORAL_TABLET | ORAL | Status: DC | PRN

## 2014-09-08 NOTE — Progress Notes (Signed)
[facility] [address] [place]  [address] [phone]  Date:  09/08/2014   Name:  Derek Schneider   DOB:  [birth year]   MRN:  [id number]  PCP:  Precious Reel, MD    Chief Complaint: Follow-up   History of Present Illness:  Derek Schneider is a 80 y.o. very pleasant male patient who presents with the following:  Patient had a fall when he tripped at work Tuesday.  Has open wound on right hand and forehead.   Apparently landed flat on his chest.  Had pain in chest at the time of his previous visit. Says awakened at 0200 with increased pain in the chest that he says was associated with some rapid heart beat. Has PSVT. No shortness of breath, radiation of pain, nausea or vomiting.  No sweats. Says went back to sleep and awakened, the pain was gone.  He still has the pain from the injury in the fall. Says he has episodes of "heart racing" that awakens him at night but never with discomfort or pain. No improvement with over the counter medications or other home remedies. Denies other complaint or health concern today.   Patient Active Problem List   Diagnosis Date Noted  . Chest pain, atypical 11/17/2011  . Hx of gastroesophageal reflux (GERD) 11/17/2011  . CHEST PAIN 11/27/2009  . GERD 06/29/2008  . HYPERTENSION 06/28/2008  . INTERNAL HEMORRHOIDS 06/28/2008  . BRONCHITIS 06/28/2008  . ESOPHAGEAL STRICTURE 06/28/2008  . GASTRITIS 06/28/2008  . HIATAL HERNIA 06/28/2008  . DIVERTICULOSIS, COLON 06/28/2008  . COLONIC POLYPS, HX OF 06/28/2008    Past Medical History  Diagnosis Date  . Bronchitis, not specified as acute or chronic   . Unspecified essential hypertension   . Internal hemorrhoids without mention of complication   . Stricture and stenosis of esophagus   . Unspecified gastritis and gastroduodenitis without mention of hemorrhage   . Diverticulosis of colon (without mention of hemorrhage)   . Personal history of colonic polyps 01/12/2008     TUBULOVILLOUS ADENOMA  . Hiatal hernia   . PVC (premature ventricular contraction)     Past Surgical History  Procedure Laterality Date  . Testiclar cyst excision    . Knee surgery  as a child    left patella removal as a child  . Tonsillectomy    . Cardiac catheterization      History  Substance Use Topics  . Smoking status: Never Smoker   . Smokeless tobacco: Never Used  . Alcohol Use: No    Family History  Problem Relation Age of Onset  . Lung cancer Brother   . Heart disease Father   . Kidney disease Father   . Cancer Brother     No Known Allergies  Medication list has been reviewed and updated.  Current Outpatient Prescriptions on File Prior to Visit  Medication Sig Dispense Refill  . aspirin 81 MG tablet Take 81 mg by mouth every other day.     Marland Kitchen dexlansoprazole (DEXILANT) 60 MG capsule Take 1 capsule (60 mg total) by mouth daily. 90 capsule 3  . ezetimibe-simvastatin (VYTORIN) 10-20 MG per tablet Take 1 tablet by mouth at bedtime.    . finasteride (PROSCAR) 5 MG tablet Take 5 mg by mouth daily.    Marland Kitchen losartan-hydrochlorothiazide (HYZAAR) 100-25 MG per tablet Take 1 tablet by mouth daily.     . mupirocin ointment (BACTROBAN) 2 % Apply 1 application topically 3 (three) times daily. [place]  g 1  . NIFEdipine (PROCARDIA XL/ADALAT-CC) 30 MG 24 hr tablet Take 30 mg by mouth daily.    . Tamsulosin HCl (FLOMAX) 0.4 MG CAPS Take 0.4 mg by mouth daily.    Marland Kitchen CARAFATE 1 GM/10ML suspension TAKE TWO TEASPOONFULS BY MOUTH EVERY TWO TO FOUR HOURS AS NEEDED (Patient not taking: Reported on 09/08/2014) 1200 mL 0  . HYDROcodone-acetaminophen (NORCO) 5-325 MG per tablet Take 1-2 tablets by mouth every 4 (four) hours as needed for moderate pain. (Patient not taking: Reported on 09/08/2014) 30 tablet 0   No current facility-administered medications on file prior to visit.    Review of Systems:  As per HPI, otherwise negative.    Physical Examination: Filed Vitals:   09/08/14 1236   BP: 120/70  Pulse: 57  Temp: 97.3 F (36.3 C)  Resp: 18   Filed Vitals:   09/08/14 1236  Height: 5' 7.5" (1.715 m)  Weight: 190 lb (86.183 kg)   Body mass index is 29.3 kg/(m^2). Ideal Body Weight: Weight in (lb) to have BMI = 25: 161.7  GEN: WDWN, NAD, Non-toxic, A & O x 3 HEENT: Atraumatic, Normocephalic. Neck supple. No masses, No LAD. Ears and Nose: No external deformity. CV: RRR, No M/G/R. No JVD. No thrill. No extra heart sounds. PULM: CTA B, no wheezes, crackles, rhonchi. No retractions. No resp. distress. No accessory muscle use CHEST:  Mild tenderness over sternum.  No crepitus or ecchymosis. ABD: S, NT, ND, +BS. No rebound. No HSM. EXTR: No c/c/e NEURO Normal gait.  PSYCH: Normally interactive. Conversant. Not depressed or anxious appearing.  Calm demeanor.    Assessment and Plan: Chest pain Tachy dysrythmia Chest contusion TO CALL 911 for pain  Signed,  Ellison Carwin, MD   Not willing to go to ER now.  Encouraged to call 911 in future.

## 2014-09-08 NOTE — Patient Instructions (Signed)

## 2014-09-11 DIAGNOSIS — S20211A Contusion of right front wall of thorax, initial encounter: Secondary | ICD-10-CM | POA: Diagnosis not present

## 2014-09-11 DIAGNOSIS — Z6828 Body mass index (BMI) 28.0-28.9, adult: Secondary | ICD-10-CM | POA: Diagnosis not present

## 2014-09-11 DIAGNOSIS — R079 Chest pain, unspecified: Secondary | ICD-10-CM | POA: Diagnosis not present

## 2014-09-11 DIAGNOSIS — N183 Chronic kidney disease, stage 3 (moderate): Secondary | ICD-10-CM | POA: Diagnosis not present

## 2014-09-11 DIAGNOSIS — R0600 Dyspnea, unspecified: Secondary | ICD-10-CM | POA: Diagnosis not present

## 2014-09-18 ENCOUNTER — Telehealth: Payer: Self-pay | Admitting: Gastroenterology

## 2014-09-18 MED ORDER — DEXLANSOPRAZOLE 60 MG PO CPDR: 60.0000 mg | DELAYED_RELEASE_CAPSULE | Freq: Every day | ORAL | Status: DC

## 2014-09-18 NOTE — Telephone Encounter (Signed)
rx sent as requested.

## 2014-09-19 ENCOUNTER — Other Ambulatory Visit: Payer: Self-pay | Admitting: Internal Medicine

## 2014-09-19 DIAGNOSIS — K219 Gastro-esophageal reflux disease without esophagitis: Secondary | ICD-10-CM

## 2014-09-25 ENCOUNTER — Ambulatory Visit
Admission: RE | Admit: 2014-09-25 | Discharge: 2014-09-25 | Disposition: A | Discharge status: Home / Self Care | Payer: Medicare Other | Source: Ambulatory Visit | Attending: Internal Medicine | Admitting: Internal Medicine

## 2014-09-25 ENCOUNTER — Other Ambulatory Visit: Payer: Self-pay | Admitting: Internal Medicine

## 2014-09-25 DIAGNOSIS — K224 Dyskinesia of esophagus: Secondary | ICD-10-CM | POA: Diagnosis not present

## 2014-09-25 DIAGNOSIS — K449 Diaphragmatic hernia without obstruction or gangrene: Secondary | ICD-10-CM | POA: Diagnosis not present

## 2014-09-25 DIAGNOSIS — K219 Gastro-esophageal reflux disease without esophagitis: Secondary | ICD-10-CM

## 2014-10-03 ENCOUNTER — Other Ambulatory Visit: Payer: Self-pay | Admitting: Internal Medicine

## 2014-10-03 ENCOUNTER — Ambulatory Visit
Admission: RE | Admit: 2014-10-03 | Discharge: 2014-10-03 | Disposition: A | Discharge status: Home / Self Care | Payer: Medicare Other | Source: Ambulatory Visit | Attending: Internal Medicine | Admitting: Internal Medicine

## 2014-10-03 DIAGNOSIS — Z6828 Body mass index (BMI) 28.0-28.9, adult: Secondary | ICD-10-CM | POA: Diagnosis not present

## 2014-10-03 DIAGNOSIS — S2222XA Fracture of body of sternum, initial encounter for closed fracture: Secondary | ICD-10-CM | POA: Diagnosis not present

## 2014-10-03 DIAGNOSIS — I493 Ventricular premature depolarization: Secondary | ICD-10-CM | POA: Diagnosis not present

## 2014-10-03 DIAGNOSIS — I2510 Atherosclerotic heart disease of native coronary artery without angina pectoris: Secondary | ICD-10-CM | POA: Diagnosis not present

## 2014-10-03 DIAGNOSIS — N183 Chronic kidney disease, stage 3 (moderate): Secondary | ICD-10-CM | POA: Diagnosis not present

## 2014-10-03 DIAGNOSIS — S20211D Contusion of right front wall of thorax, subsequent encounter: Secondary | ICD-10-CM | POA: Diagnosis not present

## 2014-10-03 DIAGNOSIS — I10 Essential (primary) hypertension: Secondary | ICD-10-CM | POA: Diagnosis not present

## 2014-10-03 DIAGNOSIS — R0600 Dyspnea, unspecified: Secondary | ICD-10-CM

## 2014-10-12 DIAGNOSIS — I493 Ventricular premature depolarization: Secondary | ICD-10-CM | POA: Diagnosis not present

## 2014-10-12 DIAGNOSIS — E785 Hyperlipidemia, unspecified: Secondary | ICD-10-CM | POA: Diagnosis not present

## 2014-10-12 DIAGNOSIS — R079 Chest pain, unspecified: Secondary | ICD-10-CM | POA: Diagnosis not present

## 2014-10-12 DIAGNOSIS — I10 Essential (primary) hypertension: Secondary | ICD-10-CM | POA: Diagnosis not present

## 2014-10-12 DIAGNOSIS — I2510 Atherosclerotic heart disease of native coronary artery without angina pectoris: Secondary | ICD-10-CM | POA: Diagnosis not present

## 2014-10-12 DIAGNOSIS — S2222XS Fracture of body of sternum, sequela: Secondary | ICD-10-CM | POA: Diagnosis not present

## 2014-10-23 DIAGNOSIS — R079 Chest pain, unspecified: Secondary | ICD-10-CM | POA: Diagnosis not present

## 2014-11-01 DIAGNOSIS — D333 Benign neoplasm of cranial nerves: Secondary | ICD-10-CM | POA: Diagnosis not present

## 2014-11-01 DIAGNOSIS — H9313 Tinnitus, bilateral: Secondary | ICD-10-CM | POA: Diagnosis not present

## 2014-11-01 DIAGNOSIS — H903 Sensorineural hearing loss, bilateral: Secondary | ICD-10-CM | POA: Diagnosis not present

## 2014-11-01 DIAGNOSIS — H905 Unspecified sensorineural hearing loss: Secondary | ICD-10-CM | POA: Diagnosis not present

## 2015-01-03 DIAGNOSIS — M940 Chondrocostal junction syndrome [Tietze]: Secondary | ICD-10-CM | POA: Diagnosis not present

## 2015-01-03 DIAGNOSIS — R0602 Shortness of breath: Secondary | ICD-10-CM | POA: Diagnosis not present

## 2015-01-03 DIAGNOSIS — I10 Essential (primary) hypertension: Secondary | ICD-10-CM | POA: Diagnosis not present

## 2015-01-03 DIAGNOSIS — Z6828 Body mass index (BMI) 28.0-28.9, adult: Secondary | ICD-10-CM | POA: Diagnosis not present

## 2015-01-24 DIAGNOSIS — L300 Nummular dermatitis: Secondary | ICD-10-CM | POA: Diagnosis not present

## 2015-01-25 DIAGNOSIS — I493 Ventricular premature depolarization: Secondary | ICD-10-CM | POA: Diagnosis not present

## 2015-01-25 DIAGNOSIS — R42 Dizziness and giddiness: Secondary | ICD-10-CM | POA: Diagnosis not present

## 2015-01-25 DIAGNOSIS — I10 Essential (primary) hypertension: Secondary | ICD-10-CM | POA: Diagnosis not present

## 2015-01-25 DIAGNOSIS — I2510 Atherosclerotic heart disease of native coronary artery without angina pectoris: Secondary | ICD-10-CM | POA: Diagnosis not present

## 2015-01-25 DIAGNOSIS — R0602 Shortness of breath: Secondary | ICD-10-CM | POA: Diagnosis not present

## 2015-01-25 DIAGNOSIS — N183 Chronic kidney disease, stage 3 (moderate): Secondary | ICD-10-CM | POA: Diagnosis not present

## 2015-01-30 DIAGNOSIS — R079 Chest pain, unspecified: Secondary | ICD-10-CM | POA: Diagnosis not present

## 2015-01-30 DIAGNOSIS — S2222XS Fracture of body of sternum, sequela: Secondary | ICD-10-CM | POA: Diagnosis not present

## 2015-01-30 DIAGNOSIS — I2510 Atherosclerotic heart disease of native coronary artery without angina pectoris: Secondary | ICD-10-CM | POA: Diagnosis not present

## 2015-01-30 DIAGNOSIS — I10 Essential (primary) hypertension: Secondary | ICD-10-CM | POA: Diagnosis not present

## 2015-01-30 DIAGNOSIS — I493 Ventricular premature depolarization: Secondary | ICD-10-CM | POA: Diagnosis not present

## 2015-01-30 DIAGNOSIS — E785 Hyperlipidemia, unspecified: Secondary | ICD-10-CM | POA: Diagnosis not present

## 2015-01-31 DIAGNOSIS — H6123 Impacted cerumen, bilateral: Secondary | ICD-10-CM | POA: Diagnosis not present

## 2015-01-31 DIAGNOSIS — D333 Benign neoplasm of cranial nerves: Secondary | ICD-10-CM | POA: Diagnosis not present

## 2015-01-31 DIAGNOSIS — H903 Sensorineural hearing loss, bilateral: Secondary | ICD-10-CM | POA: Diagnosis not present

## 2015-01-31 DIAGNOSIS — H905 Unspecified sensorineural hearing loss: Secondary | ICD-10-CM | POA: Diagnosis not present

## 2015-01-31 DIAGNOSIS — H9313 Tinnitus, bilateral: Secondary | ICD-10-CM | POA: Diagnosis not present

## 2015-02-02 ENCOUNTER — Encounter: Payer: Self-pay | Admitting: Gastroenterology

## 2015-02-02 DIAGNOSIS — R5381 Other malaise: Secondary | ICD-10-CM | POA: Diagnosis not present

## 2015-02-02 DIAGNOSIS — M940 Chondrocostal junction syndrome [Tietze]: Secondary | ICD-10-CM | POA: Diagnosis not present

## 2015-02-02 DIAGNOSIS — R42 Dizziness and giddiness: Secondary | ICD-10-CM | POA: Diagnosis not present

## 2015-02-02 DIAGNOSIS — Z6828 Body mass index (BMI) 28.0-28.9, adult: Secondary | ICD-10-CM | POA: Diagnosis not present

## 2015-02-02 DIAGNOSIS — I10 Essential (primary) hypertension: Secondary | ICD-10-CM | POA: Diagnosis not present

## 2015-02-02 DIAGNOSIS — M859 Disorder of bone density and structure, unspecified: Secondary | ICD-10-CM | POA: Diagnosis not present

## 2015-02-02 DIAGNOSIS — I493 Ventricular premature depolarization: Secondary | ICD-10-CM | POA: Diagnosis not present

## 2015-02-20 DIAGNOSIS — N138 Other obstructive and reflux uropathy: Secondary | ICD-10-CM | POA: Diagnosis not present

## 2015-02-20 DIAGNOSIS — R339 Retention of urine, unspecified: Secondary | ICD-10-CM | POA: Diagnosis not present

## 2015-02-20 DIAGNOSIS — N401 Enlarged prostate with lower urinary tract symptoms: Secondary | ICD-10-CM | POA: Diagnosis not present

## 2015-03-05 ENCOUNTER — Encounter: Payer: Self-pay | Admitting: Gastroenterology

## 2015-04-09 ENCOUNTER — Other Ambulatory Visit: Payer: Self-pay

## 2015-04-13 DIAGNOSIS — I10 Essential (primary) hypertension: Secondary | ICD-10-CM | POA: Diagnosis not present

## 2015-04-13 DIAGNOSIS — I2510 Atherosclerotic heart disease of native coronary artery without angina pectoris: Secondary | ICD-10-CM | POA: Diagnosis not present

## 2015-04-13 DIAGNOSIS — E785 Hyperlipidemia, unspecified: Secondary | ICD-10-CM | POA: Diagnosis not present

## 2015-04-13 DIAGNOSIS — Z125 Encounter for screening for malignant neoplasm of prostate: Secondary | ICD-10-CM | POA: Diagnosis not present

## 2015-04-17 ENCOUNTER — Ambulatory Visit (INDEPENDENT_AMBULATORY_CARE_PROVIDER_SITE_OTHER): Payer: Medicare Other | Admitting: Gastroenterology

## 2015-04-17 ENCOUNTER — Encounter: Payer: Self-pay | Admitting: Gastroenterology

## 2015-04-17 VITALS — BP 102/70 | HR 60 | Ht 67.5 in | Wt 182.0 lb

## 2015-04-17 DIAGNOSIS — K219 Gastro-esophageal reflux disease without esophagitis: Secondary | ICD-10-CM

## 2015-04-17 NOTE — Patient Instructions (Signed)
Will refill your antiacid medicine in 1 year without need for office visit. Can try omeprazole 20mg  pill (OTC strength) one pill 20-30 min before a meal every other day, alternating with dexilant. ROV as needed.

## 2015-04-17 NOTE — Progress Notes (Signed)
Review of pertinent gastrointestinal problems: 1. adenomatous colon polyps. Several colonoscopies by Dr. Verl Blalock, the first was in 2002. He has had multiple adenomatous polyps removed from his colon over the years. He had a colonoscopy in 2009, a cecal 1.7 tubular villous adenoma was removed as well as another polyp in his colon. Followup colonoscopy one year later showed no residual adenomatous polyps and he was recommended to have repeat colonoscopy at 5 year interval, which would be at the age of 81.    HPI: This is a very pleasant 81 year old man whom I last saw about a year ago  Chief complaint is GERD   He is here for his annual recheck.  He does have burning regurg at times.  Has some SOB at times, knows he has asthma.  He takes dexlinant once daily.Very rare breakthrough burning in chest.  No dysphagia.  Does cough slightly with water, liquids only.  Broke his sternum about a year ago, since then gained weight  Esophageal Manometry  Procedure date: 12/03/2009  Findings:  normal:  Esophageal manometry was completed on December 03, 2009. Results are as follows:  #1 upper esophageal sphincter-there is normal coordination between pharyngeal contraction cricopharyngeal relaxation.  #2 lower esophageal sphincter-mean pressure is normal at 40 mm of mercury with normal relaxation of swallowing.  #3-motility pattern-they're normally propagated peristaltic waves of normal amplitude and duration throughout the length of the esophagus both wet and dry swallows. Mean amplitude of contractions is 198 mm of mercury.  Assessment: This a normal esophageal manometry without evidence of lower esophageal sphincter incompetency or esophageal spasm. This patient has atypical chest pain although he is managed for chronic GERD. 24-hour pH probe test was also completed today and is consistent with chronic daytime acid reflux.    24-hour pH probe testing  results  Outpatient pH probe test was also accomplished. Total DeMeester score normal at 21 normal being less than 22. However, there is increased acid reflux in the upright position with total time the pH is less than 4 at 8% normal being less than 6.3%. Total time in reflux is also elevated at 5.2%, normal less than 4.2%. There are 6 reflux episodes normal less than 50. Longest reflux episode was 7.8 minutes. There is no proximal acid reflux noted and there is no nocturnal acid reflux noted.  Assessment: This 24 pH probe test shows mild daytime upright GERD. There is no nocturnal GERD and no correlation with his symptoms in any recorded episodes of acid reflux. We will continue his reflux regime and daily PPI therapy but I see no need for further GI evaluation this time.  Past Medical History  Diagnosis Date  . Bronchitis, not specified as acute or chronic   . Unspecified essential hypertension   . Internal hemorrhoids without mention of complication   . Stricture and stenosis of esophagus   . Unspecified gastritis and gastroduodenitis without mention of hemorrhage   . Diverticulosis of colon (without mention of hemorrhage)   . Personal history of colonic polyps 01/12/2008    TUBULOVILLOUS ADENOMA  . Hiatal hernia   . PVC (premature ventricular contraction)     Past Surgical History  Procedure Laterality Date  . Testiclar cyst excision    . Knee surgery  as a child    left patella removal as a child  . Tonsillectomy    . Cardiac catheterization      Current Outpatient Prescriptions  Medication Sig Dispense Refill  . albuterol (PROVENTIL HFA;VENTOLIN HFA) 108 (  90 BASE) MCG/ACT inhaler Inhale 1 puff into the lungs every 6 (six) hours as needed for wheezing or shortness of breath.    Marland Kitchen aspirin 81 MG tablet Take 81 mg by mouth every other day.     . cholecalciferol (VITAMIN D) 1000 UNITS tablet Take 1,000 Units by mouth daily.    Marland Kitchen dexlansoprazole (DEXILANT) 60 MG capsule Take 1  capsule (60 mg total) by mouth daily. 90 capsule 3  . ezetimibe-simvastatin (VYTORIN) 10-20 MG per tablet Take 1 tablet by mouth at bedtime.    . finasteride (PROSCAR) 5 MG tablet Take 5 mg by mouth daily.    Marland Kitchen loratadine (CLARITIN) 10 MG tablet Take 10 mg by mouth daily as needed for allergies.    Marland Kitchen losartan-hydrochlorothiazide (HYZAAR) 100-25 MG per tablet Take 1 tablet by mouth daily.     Marland Kitchen NIFEdipine (PROCARDIA XL/ADALAT-CC) 30 MG 24 hr tablet Take 30 mg by mouth daily.    . Tamsulosin HCl (FLOMAX) 0.4 MG CAPS Take 0.4 mg by mouth daily.     No current facility-administered medications for this visit.    Allergies as of 04/17/2015  . (No Known Allergies)    Family History  Problem Relation Age of Onset  . Lung cancer Brother   . Heart disease Father   . Kidney disease Father   . Cancer Brother     History   Social History  . Marital Status: Married    Spouse Name: N/A  . Number of Children: 5  . Years of Education: N/A   Occupational History  . Self employed    Social History Main Topics  . Smoking status: Never Smoker   . Smokeless tobacco: Never Used  . Alcohol Use: No  . Drug Use: No  . Sexual Activity: Not on file   Other Topics Concern  . Not on file   Social History Narrative     Physical Exam: BP 102/70 mmHg  Pulse 60  Ht 5' 7.5" (1.715 m)  Wt 182 lb (82.555 kg)  BMI 28.07 kg/m2 Constitutional: generally well-appearing Psychiatric: alert and oriented x3 Abdomen: soft, nontender, nondistended, no obvious ascites, no peritoneal signs, normal bowel sounds   Assessment and plan: 81 y.o. male with chronic GERD without alarm symptoms  I recommended he stay on his Dexilant. He asked about trying less expensive alternatives periodically and I recommended he try over-the-counter omeprazole every other day 20-30 minutes before mealtime alternating with Dexilant. This could help spread out his prescription for Dexilant which is quite expensive. He does  not need to return here for prescription refill for 2 years but knows to call sooner than that if he has any questions or concerns.   Owens Loffler, MD [facility] 04/17/2015, 9:15 AM

## 2015-04-19 ENCOUNTER — Other Ambulatory Visit: Payer: Self-pay | Admitting: Otolaryngology

## 2015-04-19 DIAGNOSIS — D333 Benign neoplasm of cranial nerves: Secondary | ICD-10-CM | POA: Diagnosis not present

## 2015-04-19 DIAGNOSIS — N401 Enlarged prostate with lower urinary tract symptoms: Secondary | ICD-10-CM | POA: Diagnosis not present

## 2015-04-19 DIAGNOSIS — E785 Hyperlipidemia, unspecified: Secondary | ICD-10-CM | POA: Diagnosis not present

## 2015-04-19 DIAGNOSIS — Z1389 Encounter for screening for other disorder: Secondary | ICD-10-CM | POA: Diagnosis not present

## 2015-04-19 DIAGNOSIS — I493 Ventricular premature depolarization: Secondary | ICD-10-CM | POA: Diagnosis not present

## 2015-04-19 DIAGNOSIS — I2510 Atherosclerotic heart disease of native coronary artery without angina pectoris: Secondary | ICD-10-CM | POA: Diagnosis not present

## 2015-04-19 DIAGNOSIS — I10 Essential (primary) hypertension: Secondary | ICD-10-CM | POA: Diagnosis not present

## 2015-04-19 DIAGNOSIS — I130 Hypertensive heart and chronic kidney disease with heart failure and stage 1 through stage 4 chronic kidney disease, or unspecified chronic kidney disease: Secondary | ICD-10-CM | POA: Diagnosis not present

## 2015-04-19 DIAGNOSIS — Z6827 Body mass index (BMI) 27.0-27.9, adult: Secondary | ICD-10-CM | POA: Diagnosis not present

## 2015-04-19 DIAGNOSIS — J4520 Mild intermittent asthma, uncomplicated: Secondary | ICD-10-CM | POA: Diagnosis not present

## 2015-04-19 DIAGNOSIS — Z0000 Encounter for general adult medical examination without abnormal findings: Secondary | ICD-10-CM | POA: Diagnosis not present

## 2015-04-19 DIAGNOSIS — D692 Other nonthrombocytopenic purpura: Secondary | ICD-10-CM | POA: Diagnosis not present

## 2015-04-24 DIAGNOSIS — Z1212 Encounter for screening for malignant neoplasm of rectum: Secondary | ICD-10-CM | POA: Diagnosis not present

## 2015-04-30 ENCOUNTER — Ambulatory Visit
Admission: RE | Admit: 2015-04-30 | Discharge: 2015-04-30 | Disposition: A | Discharge status: Home / Self Care | Payer: Medicare Other | Source: Ambulatory Visit | Attending: Otolaryngology | Admitting: Otolaryngology

## 2015-04-30 DIAGNOSIS — D333 Benign neoplasm of cranial nerves: Secondary | ICD-10-CM

## 2015-04-30 MED ORDER — GADOBENATE DIMEGLUMINE 529 MG/ML IV SOLN
17.0000 mL | Freq: Once | INTRAVENOUS | Status: AC | PRN
  Administered 2015-04-30: 17 mL via INTRAVENOUS

## 2015-05-08 DIAGNOSIS — H01022 Squamous blepharitis right lower eyelid: Secondary | ICD-10-CM | POA: Diagnosis not present

## 2015-05-08 DIAGNOSIS — H31092 Other chorioretinal scars, left eye: Secondary | ICD-10-CM | POA: Diagnosis not present

## 2015-05-08 DIAGNOSIS — H01025 Squamous blepharitis left lower eyelid: Secondary | ICD-10-CM | POA: Diagnosis not present

## 2015-05-08 DIAGNOSIS — H2513 Age-related nuclear cataract, bilateral: Secondary | ICD-10-CM | POA: Diagnosis not present

## 2015-05-08 DIAGNOSIS — H01021 Squamous blepharitis right upper eyelid: Secondary | ICD-10-CM | POA: Diagnosis not present

## 2015-05-08 DIAGNOSIS — H01024 Squamous blepharitis left upper eyelid: Secondary | ICD-10-CM | POA: Diagnosis not present

## 2015-05-08 DIAGNOSIS — D3131 Benign neoplasm of right choroid: Secondary | ICD-10-CM | POA: Diagnosis not present

## 2015-05-28 DIAGNOSIS — I10 Essential (primary) hypertension: Secondary | ICD-10-CM | POA: Diagnosis not present

## 2015-05-28 DIAGNOSIS — Z6827 Body mass index (BMI) 27.0-27.9, adult: Secondary | ICD-10-CM | POA: Diagnosis not present

## 2015-05-28 DIAGNOSIS — G2581 Restless legs syndrome: Secondary | ICD-10-CM | POA: Diagnosis not present

## 2015-07-10 DIAGNOSIS — L0889 Other specified local infections of the skin and subcutaneous tissue: Secondary | ICD-10-CM | POA: Diagnosis not present

## 2015-07-10 DIAGNOSIS — D485 Neoplasm of uncertain behavior of skin: Secondary | ICD-10-CM | POA: Diagnosis not present

## 2015-07-10 DIAGNOSIS — L0109 Other impetigo: Secondary | ICD-10-CM | POA: Diagnosis not present

## 2015-07-10 DIAGNOSIS — L281 Prurigo nodularis: Secondary | ICD-10-CM | POA: Diagnosis not present

## 2015-07-10 DIAGNOSIS — L821 Other seborrheic keratosis: Secondary | ICD-10-CM | POA: Diagnosis not present

## 2015-07-10 DIAGNOSIS — D692 Other nonthrombocytopenic purpura: Secondary | ICD-10-CM | POA: Diagnosis not present

## 2015-07-10 DIAGNOSIS — L820 Inflamed seborrheic keratosis: Secondary | ICD-10-CM | POA: Diagnosis not present

## 2015-08-06 ENCOUNTER — Ambulatory Visit (INDEPENDENT_AMBULATORY_CARE_PROVIDER_SITE_OTHER): Payer: Medicare Other | Admitting: Family Medicine

## 2015-08-06 VITALS — BP 152/74 | HR 61 | Temp 97.5°F | Resp 16 | Ht 67.0 in | Wt 185.8 lb

## 2015-08-06 DIAGNOSIS — H2021 Lens-induced iridocyclitis, right eye: Secondary | ICD-10-CM | POA: Diagnosis not present

## 2015-08-06 DIAGNOSIS — H109 Unspecified conjunctivitis: Secondary | ICD-10-CM

## 2015-08-06 MED ORDER — TOBRAMYCIN 0.3 % OP SOLN: 1.0000 [drp] | OPHTHALMIC | Status: DC

## 2015-08-06 NOTE — Patient Instructions (Signed)
You appear to have a bacterial conjunctivitis after having superficially scratched cornea with the contact lens. Keep the contact lens out of the eye for the next 48 hours. Continue to use drops every 4 hours while awake for the next 5 days.

## 2015-08-06 NOTE — Progress Notes (Signed)
   Subjective:    Patient ID: SIGISMUND CROSS, male    DOB: 1926-07-08, 81 y.o.   MRN: [id number] This chart was scribed for Robyn Haber, MD by Zola Button, Medical Scribe. This patient was seen in Room 1 and the patient's care was started at 8:51 PM.   HPI HPI Comments: WENCESLAO LOPER is a 81 y.o. male who [facility] complaining of right eye irritation with redness that started this morning. This is associated with use of contact lenses. He took out the lens, but his eye is still irritated. Patient denies history of cataracts.    Review of Systems  Eyes: Positive for pain and redness.       Objective:   Physical Exam CONSTITUTIONAL: Well developed/well nourished HEAD: Normocephalic/atraumatic EYES: EOM/PERRL;  floor seen uptake is negative for dendrites. Patient does have some uptake at the inferior border of the right cornea with associated conjunctival injection. He has mucopurulent discharge in the medial canthus. EOM is normal ENMT: Mucous membranes moist NECK: supple no meningeal signs SPINE: entire spine nontender CV: S1/S2 noted, no murmurs/rubs/gallops noted LUNGS: Lungs are clear to auscultation bilaterally, no apparent distress ABDOMEN: soft, nontender, no rebound or guarding GU: no cva tenderness NEURO: Pt is awake/alert, moves all extremitiesx4 EXTREMITIES: pulses normal, full ROM SKIN: warm, color normal PSYCH: no abnormalities of mood noted        Assessment & Plan:   By signing my name below, I, Zola Button, attest that this documentation has been prepared under the direction and in the presence of Robyn Haber, MD.  Electronically Signed: Zola Button, Medical Scribe. 08/06/2015. 8:49 PM.  This chart was scribed in my presence and reviewed by me personally.    ICD-9-CM ICD-10-CM   1. Conjunctivitis of right eye 372.30 H10.9 tobramycin (TOBREX) 0.3 % ophthalmic solution  2. Iritis, lens-induced, right 364.23  H20.21 tobramycin (TOBREX) 0.3 % ophthalmic solution     Signed, Robyn Haber, MD

## 2015-08-07 DIAGNOSIS — H18821 Corneal disorder due to contact lens, right eye: Secondary | ICD-10-CM | POA: Diagnosis not present

## 2015-08-07 DIAGNOSIS — T1501XA Foreign body in cornea, right eye, initial encounter: Secondary | ICD-10-CM | POA: Diagnosis not present

## 2015-08-16 DIAGNOSIS — Z23 Encounter for immunization: Secondary | ICD-10-CM | POA: Diagnosis not present

## 2015-08-31 DIAGNOSIS — N3281 Overactive bladder: Secondary | ICD-10-CM | POA: Diagnosis not present

## 2015-09-26 ENCOUNTER — Other Ambulatory Visit: Payer: Self-pay | Admitting: Gastroenterology

## 2015-10-16 ENCOUNTER — Telehealth: Payer: Self-pay | Admitting: Gastroenterology

## 2015-10-16 NOTE — Telephone Encounter (Signed)
I received a call from the insurance stating that the tier exception was denied.. They will call the pt and make him aware.

## 2015-10-16 NOTE — Telephone Encounter (Signed)
The insurance company stated to me that the pt has no prescription coverage with the ID number we have on file.  I called the patient and asked if he could get me the ID number to his prescription coverage I would be glad to call back and try to get an exception.  The pt agreed and will call back with the correct info.

## 2015-10-18 DIAGNOSIS — I2510 Atherosclerotic heart disease of native coronary artery without angina pectoris: Secondary | ICD-10-CM | POA: Diagnosis not present

## 2015-10-18 DIAGNOSIS — I10 Essential (primary) hypertension: Secondary | ICD-10-CM | POA: Diagnosis not present

## 2015-10-18 DIAGNOSIS — G2581 Restless legs syndrome: Secondary | ICD-10-CM | POA: Diagnosis not present

## 2015-10-18 DIAGNOSIS — D692 Other nonthrombocytopenic purpura: Secondary | ICD-10-CM | POA: Diagnosis not present

## 2015-10-18 DIAGNOSIS — Z6827 Body mass index (BMI) 27.0-27.9, adult: Secondary | ICD-10-CM | POA: Diagnosis not present

## 2015-10-18 DIAGNOSIS — R0789 Other chest pain: Secondary | ICD-10-CM | POA: Diagnosis not present

## 2015-10-18 DIAGNOSIS — I130 Hypertensive heart and chronic kidney disease with heart failure and stage 1 through stage 4 chronic kidney disease, or unspecified chronic kidney disease: Secondary | ICD-10-CM | POA: Diagnosis not present

## 2015-10-18 DIAGNOSIS — R233 Spontaneous ecchymoses: Secondary | ICD-10-CM | POA: Diagnosis not present

## 2015-10-18 DIAGNOSIS — E784 Other hyperlipidemia: Secondary | ICD-10-CM | POA: Diagnosis not present

## 2015-10-19 ENCOUNTER — Telehealth: Payer: Self-pay | Admitting: Gastroenterology

## 2015-10-19 MED ORDER — DEXLANSOPRAZOLE 60 MG PO CPDR: 1.0000 | DELAYED_RELEASE_CAPSULE | Freq: Every day | ORAL | Status: DC

## 2015-10-19 NOTE — Telephone Encounter (Signed)
rx sent

## 2015-10-22 ENCOUNTER — Telehealth: Payer: Self-pay | Admitting: Gastroenterology

## 2015-10-22 MED ORDER — PANTOPRAZOLE SODIUM 40 MG PO TBEC: 40.0000 mg | DELAYED_RELEASE_TABLET | Freq: Every day | ORAL | Status: DC

## 2015-10-22 NOTE — Telephone Encounter (Signed)
Pt states he cannot afford the dexilant and is requesting protonix be called in for him. Ok to send in protonix? Please advise.

## 2015-10-22 NOTE — Telephone Encounter (Signed)
Yes, protonix 40mg  pill, one pill once daily 30 min before BF meal.  Disp 30 with 11 refills

## 2015-10-22 NOTE — Telephone Encounter (Signed)
Pt aware and script for protonix sent to pharmacy.

## 2016-01-28 DIAGNOSIS — I2510 Atherosclerotic heart disease of native coronary artery without angina pectoris: Secondary | ICD-10-CM | POA: Diagnosis not present

## 2016-01-28 DIAGNOSIS — I493 Ventricular premature depolarization: Secondary | ICD-10-CM | POA: Diagnosis not present

## 2016-01-28 DIAGNOSIS — E785 Hyperlipidemia, unspecified: Secondary | ICD-10-CM | POA: Diagnosis not present

## 2016-01-28 DIAGNOSIS — I10 Essential (primary) hypertension: Secondary | ICD-10-CM | POA: Diagnosis not present

## 2016-02-01 DIAGNOSIS — C44629 Squamous cell carcinoma of skin of left upper limb, including shoulder: Secondary | ICD-10-CM | POA: Diagnosis not present

## 2016-02-01 DIAGNOSIS — L820 Inflamed seborrheic keratosis: Secondary | ICD-10-CM | POA: Diagnosis not present

## 2016-02-01 DIAGNOSIS — C44621 Squamous cell carcinoma of skin of unspecified upper limb, including shoulder: Secondary | ICD-10-CM | POA: Diagnosis not present

## 2016-02-01 DIAGNOSIS — L309 Dermatitis, unspecified: Secondary | ICD-10-CM | POA: Diagnosis not present

## 2016-02-13 DIAGNOSIS — L820 Inflamed seborrheic keratosis: Secondary | ICD-10-CM | POA: Diagnosis not present

## 2016-02-13 DIAGNOSIS — Z85828 Personal history of other malignant neoplasm of skin: Secondary | ICD-10-CM | POA: Diagnosis not present

## 2016-02-13 DIAGNOSIS — D485 Neoplasm of uncertain behavior of skin: Secondary | ICD-10-CM | POA: Diagnosis not present

## 2016-02-13 DIAGNOSIS — L308 Other specified dermatitis: Secondary | ICD-10-CM | POA: Diagnosis not present

## 2016-02-13 DIAGNOSIS — L570 Actinic keratosis: Secondary | ICD-10-CM | POA: Diagnosis not present

## 2016-04-02 DIAGNOSIS — M25512 Pain in left shoulder: Secondary | ICD-10-CM | POA: Diagnosis not present

## 2016-04-11 ENCOUNTER — Other Ambulatory Visit: Payer: Self-pay | Admitting: Otolaryngology

## 2016-04-11 DIAGNOSIS — D333 Benign neoplasm of cranial nerves: Secondary | ICD-10-CM

## 2016-04-16 DIAGNOSIS — E784 Other hyperlipidemia: Secondary | ICD-10-CM | POA: Diagnosis not present

## 2016-04-16 DIAGNOSIS — Z125 Encounter for screening for malignant neoplasm of prostate: Secondary | ICD-10-CM | POA: Diagnosis not present

## 2016-04-16 DIAGNOSIS — I10 Essential (primary) hypertension: Secondary | ICD-10-CM | POA: Diagnosis not present

## 2016-04-22 DIAGNOSIS — K5909 Other constipation: Secondary | ICD-10-CM | POA: Diagnosis not present

## 2016-04-22 DIAGNOSIS — K219 Gastro-esophageal reflux disease without esophagitis: Secondary | ICD-10-CM | POA: Diagnosis not present

## 2016-04-22 DIAGNOSIS — Z6827 Body mass index (BMI) 27.0-27.9, adult: Secondary | ICD-10-CM | POA: Diagnosis not present

## 2016-04-22 DIAGNOSIS — H9313 Tinnitus, bilateral: Secondary | ICD-10-CM | POA: Diagnosis not present

## 2016-04-22 DIAGNOSIS — M25512 Pain in left shoulder: Secondary | ICD-10-CM | POA: Diagnosis not present

## 2016-04-22 DIAGNOSIS — J4520 Mild intermittent asthma, uncomplicated: Secondary | ICD-10-CM | POA: Diagnosis not present

## 2016-04-22 DIAGNOSIS — I493 Ventricular premature depolarization: Secondary | ICD-10-CM | POA: Diagnosis not present

## 2016-04-22 DIAGNOSIS — Z23 Encounter for immunization: Secondary | ICD-10-CM | POA: Diagnosis not present

## 2016-04-22 DIAGNOSIS — Z0000 Encounter for general adult medical examination without abnormal findings: Secondary | ICD-10-CM | POA: Diagnosis not present

## 2016-04-22 DIAGNOSIS — D333 Benign neoplasm of cranial nerves: Secondary | ICD-10-CM | POA: Diagnosis not present

## 2016-04-22 DIAGNOSIS — Z1389 Encounter for screening for other disorder: Secondary | ICD-10-CM | POA: Diagnosis not present

## 2016-04-22 DIAGNOSIS — I130 Hypertensive heart and chronic kidney disease with heart failure and stage 1 through stage 4 chronic kidney disease, or unspecified chronic kidney disease: Secondary | ICD-10-CM | POA: Diagnosis not present

## 2016-04-22 DIAGNOSIS — N183 Chronic kidney disease, stage 3 (moderate): Secondary | ICD-10-CM | POA: Diagnosis not present

## 2016-04-23 ENCOUNTER — Ambulatory Visit
Admission: RE | Admit: 2016-04-23 | Discharge: 2016-04-23 | Disposition: A | Discharge status: Home / Self Care | Payer: Medicare Other | Source: Ambulatory Visit | Attending: Otolaryngology | Admitting: Otolaryngology

## 2016-04-23 DIAGNOSIS — D3610 Benign neoplasm of peripheral nerves and autonomic nervous system, unspecified: Secondary | ICD-10-CM | POA: Diagnosis not present

## 2016-04-23 DIAGNOSIS — D333 Benign neoplasm of cranial nerves: Secondary | ICD-10-CM

## 2016-04-23 MED ORDER — GADOBENATE DIMEGLUMINE 529 MG/ML IV SOLN
8.0000 mL | Freq: Once | INTRAVENOUS | Status: AC | PRN
  Administered 2016-04-23: 8 mL via INTRAVENOUS

## 2016-05-12 DIAGNOSIS — H31092 Other chorioretinal scars, left eye: Secondary | ICD-10-CM | POA: Diagnosis not present

## 2016-05-12 DIAGNOSIS — H01024 Squamous blepharitis left upper eyelid: Secondary | ICD-10-CM | POA: Diagnosis not present

## 2016-05-12 DIAGNOSIS — H01025 Squamous blepharitis left lower eyelid: Secondary | ICD-10-CM | POA: Diagnosis not present

## 2016-05-12 DIAGNOSIS — H01022 Squamous blepharitis right lower eyelid: Secondary | ICD-10-CM | POA: Diagnosis not present

## 2016-05-12 DIAGNOSIS — H2513 Age-related nuclear cataract, bilateral: Secondary | ICD-10-CM | POA: Diagnosis not present

## 2016-05-12 DIAGNOSIS — D3131 Benign neoplasm of right choroid: Secondary | ICD-10-CM | POA: Diagnosis not present

## 2016-05-12 DIAGNOSIS — H01021 Squamous blepharitis right upper eyelid: Secondary | ICD-10-CM | POA: Diagnosis not present

## 2016-05-15 DIAGNOSIS — M25512 Pain in left shoulder: Secondary | ICD-10-CM | POA: Diagnosis not present

## 2016-05-15 DIAGNOSIS — S4982XA Other specified injuries of left shoulder and upper arm, initial encounter: Secondary | ICD-10-CM | POA: Diagnosis not present

## 2016-05-15 DIAGNOSIS — M25312 Other instability, left shoulder: Secondary | ICD-10-CM | POA: Diagnosis not present

## 2016-05-15 DIAGNOSIS — M75102 Unspecified rotator cuff tear or rupture of left shoulder, not specified as traumatic: Secondary | ICD-10-CM | POA: Diagnosis not present

## 2016-05-16 DIAGNOSIS — N3281 Overactive bladder: Secondary | ICD-10-CM | POA: Diagnosis not present

## 2016-05-26 DIAGNOSIS — M25512 Pain in left shoulder: Secondary | ICD-10-CM | POA: Diagnosis not present

## 2016-06-03 DIAGNOSIS — M25512 Pain in left shoulder: Secondary | ICD-10-CM | POA: Diagnosis not present

## 2016-06-05 DIAGNOSIS — M25512 Pain in left shoulder: Secondary | ICD-10-CM | POA: Diagnosis not present

## 2016-06-09 DIAGNOSIS — M25512 Pain in left shoulder: Secondary | ICD-10-CM | POA: Diagnosis not present

## 2016-06-12 DIAGNOSIS — M25512 Pain in left shoulder: Secondary | ICD-10-CM | POA: Diagnosis not present

## 2016-06-18 DIAGNOSIS — M25512 Pain in left shoulder: Secondary | ICD-10-CM | POA: Diagnosis not present

## 2016-06-20 DIAGNOSIS — M25512 Pain in left shoulder: Secondary | ICD-10-CM | POA: Diagnosis not present

## 2016-06-24 DIAGNOSIS — M25512 Pain in left shoulder: Secondary | ICD-10-CM | POA: Diagnosis not present

## 2016-06-27 DIAGNOSIS — M25512 Pain in left shoulder: Secondary | ICD-10-CM | POA: Diagnosis not present

## 2016-07-08 DIAGNOSIS — M75102 Unspecified rotator cuff tear or rupture of left shoulder, not specified as traumatic: Secondary | ICD-10-CM | POA: Diagnosis not present

## 2016-07-08 DIAGNOSIS — M25512 Pain in left shoulder: Secondary | ICD-10-CM | POA: Diagnosis not present

## 2016-07-08 DIAGNOSIS — S4982XD Other specified injuries of left shoulder and upper arm, subsequent encounter: Secondary | ICD-10-CM | POA: Diagnosis not present

## 2016-07-08 DIAGNOSIS — M25312 Other instability, left shoulder: Secondary | ICD-10-CM | POA: Diagnosis not present

## 2016-08-14 DIAGNOSIS — Z23 Encounter for immunization: Secondary | ICD-10-CM | POA: Diagnosis not present

## 2016-08-21 DIAGNOSIS — H6123 Impacted cerumen, bilateral: Secondary | ICD-10-CM | POA: Diagnosis not present

## 2016-08-21 DIAGNOSIS — H905 Unspecified sensorineural hearing loss: Secondary | ICD-10-CM | POA: Diagnosis not present

## 2016-08-21 DIAGNOSIS — D333 Benign neoplasm of cranial nerves: Secondary | ICD-10-CM | POA: Diagnosis not present

## 2016-08-21 DIAGNOSIS — H903 Sensorineural hearing loss, bilateral: Secondary | ICD-10-CM | POA: Diagnosis not present

## 2016-08-21 DIAGNOSIS — H9313 Tinnitus, bilateral: Secondary | ICD-10-CM | POA: Diagnosis not present

## 2016-09-01 DIAGNOSIS — D485 Neoplasm of uncertain behavior of skin: Secondary | ICD-10-CM | POA: Diagnosis not present

## 2016-09-01 DIAGNOSIS — L570 Actinic keratosis: Secondary | ICD-10-CM | POA: Diagnosis not present

## 2016-09-01 DIAGNOSIS — Z85828 Personal history of other malignant neoplasm of skin: Secondary | ICD-10-CM | POA: Diagnosis not present

## 2016-09-01 DIAGNOSIS — D1801 Hemangioma of skin and subcutaneous tissue: Secondary | ICD-10-CM | POA: Diagnosis not present

## 2016-09-01 DIAGNOSIS — L720 Epidermal cyst: Secondary | ICD-10-CM | POA: Diagnosis not present

## 2016-09-01 DIAGNOSIS — L821 Other seborrheic keratosis: Secondary | ICD-10-CM | POA: Diagnosis not present

## 2016-09-01 DIAGNOSIS — L820 Inflamed seborrheic keratosis: Secondary | ICD-10-CM | POA: Diagnosis not present

## 2016-10-24 DIAGNOSIS — N183 Chronic kidney disease, stage 3 (moderate): Secondary | ICD-10-CM | POA: Diagnosis not present

## 2016-10-24 DIAGNOSIS — M25512 Pain in left shoulder: Secondary | ICD-10-CM | POA: Diagnosis not present

## 2016-10-24 DIAGNOSIS — G2581 Restless legs syndrome: Secondary | ICD-10-CM | POA: Diagnosis not present

## 2016-10-24 DIAGNOSIS — D333 Benign neoplasm of cranial nerves: Secondary | ICD-10-CM | POA: Diagnosis not present

## 2016-10-24 DIAGNOSIS — I130 Hypertensive heart and chronic kidney disease with heart failure and stage 1 through stage 4 chronic kidney disease, or unspecified chronic kidney disease: Secondary | ICD-10-CM | POA: Diagnosis not present

## 2016-10-24 DIAGNOSIS — N401 Enlarged prostate with lower urinary tract symptoms: Secondary | ICD-10-CM | POA: Diagnosis not present

## 2016-10-24 DIAGNOSIS — Z6827 Body mass index (BMI) 27.0-27.9, adult: Secondary | ICD-10-CM | POA: Diagnosis not present

## 2016-10-24 DIAGNOSIS — I2510 Atherosclerotic heart disease of native coronary artery without angina pectoris: Secondary | ICD-10-CM | POA: Diagnosis not present

## 2016-10-24 DIAGNOSIS — D692 Other nonthrombocytopenic purpura: Secondary | ICD-10-CM | POA: Diagnosis not present

## 2016-11-06 ENCOUNTER — Other Ambulatory Visit: Payer: Self-pay | Admitting: Gastroenterology

## 2016-11-25 DIAGNOSIS — Z6827 Body mass index (BMI) 27.0-27.9, adult: Secondary | ICD-10-CM | POA: Diagnosis not present

## 2016-11-25 DIAGNOSIS — R42 Dizziness and giddiness: Secondary | ICD-10-CM | POA: Diagnosis not present

## 2016-11-25 DIAGNOSIS — D333 Benign neoplasm of cranial nerves: Secondary | ICD-10-CM | POA: Diagnosis not present

## 2017-02-06 DIAGNOSIS — I493 Ventricular premature depolarization: Secondary | ICD-10-CM | POA: Diagnosis not present

## 2017-02-06 DIAGNOSIS — I119 Hypertensive heart disease without heart failure: Secondary | ICD-10-CM | POA: Diagnosis not present

## 2017-02-06 DIAGNOSIS — I2510 Atherosclerotic heart disease of native coronary artery without angina pectoris: Secondary | ICD-10-CM | POA: Diagnosis not present

## 2017-02-06 DIAGNOSIS — E785 Hyperlipidemia, unspecified: Secondary | ICD-10-CM | POA: Diagnosis not present

## 2017-02-06 DIAGNOSIS — I10 Essential (primary) hypertension: Secondary | ICD-10-CM | POA: Diagnosis not present

## 2017-03-03 DIAGNOSIS — H9313 Tinnitus, bilateral: Secondary | ICD-10-CM | POA: Diagnosis not present

## 2017-03-03 DIAGNOSIS — H905 Unspecified sensorineural hearing loss: Secondary | ICD-10-CM | POA: Diagnosis not present

## 2017-03-03 DIAGNOSIS — H6123 Impacted cerumen, bilateral: Secondary | ICD-10-CM | POA: Diagnosis not present

## 2017-03-03 DIAGNOSIS — H903 Sensorineural hearing loss, bilateral: Secondary | ICD-10-CM | POA: Diagnosis not present

## 2017-03-03 DIAGNOSIS — D333 Benign neoplasm of cranial nerves: Secondary | ICD-10-CM | POA: Diagnosis not present

## 2017-03-23 DIAGNOSIS — D3131 Benign neoplasm of right choroid: Secondary | ICD-10-CM | POA: Diagnosis not present

## 2017-03-23 DIAGNOSIS — H31092 Other chorioretinal scars, left eye: Secondary | ICD-10-CM | POA: Diagnosis not present

## 2017-03-23 DIAGNOSIS — H01024 Squamous blepharitis left upper eyelid: Secondary | ICD-10-CM | POA: Diagnosis not present

## 2017-03-23 DIAGNOSIS — H5315 Visual distortions of shape and size: Secondary | ICD-10-CM | POA: Diagnosis not present

## 2017-03-23 DIAGNOSIS — H43811 Vitreous degeneration, right eye: Secondary | ICD-10-CM | POA: Diagnosis not present

## 2017-03-23 DIAGNOSIS — H01021 Squamous blepharitis right upper eyelid: Secondary | ICD-10-CM | POA: Diagnosis not present

## 2017-03-23 DIAGNOSIS — H01025 Squamous blepharitis left lower eyelid: Secondary | ICD-10-CM | POA: Diagnosis not present

## 2017-03-23 DIAGNOSIS — H2513 Age-related nuclear cataract, bilateral: Secondary | ICD-10-CM | POA: Diagnosis not present

## 2017-03-23 DIAGNOSIS — H01022 Squamous blepharitis right lower eyelid: Secondary | ICD-10-CM | POA: Diagnosis not present

## 2017-04-09 ENCOUNTER — Other Ambulatory Visit: Payer: Self-pay | Admitting: Otolaryngology

## 2017-04-09 DIAGNOSIS — H903 Sensorineural hearing loss, bilateral: Secondary | ICD-10-CM

## 2017-04-09 DIAGNOSIS — D333 Benign neoplasm of cranial nerves: Secondary | ICD-10-CM

## 2017-04-20 ENCOUNTER — Ambulatory Visit (INDEPENDENT_AMBULATORY_CARE_PROVIDER_SITE_OTHER): Payer: Medicare Other

## 2017-04-20 ENCOUNTER — Encounter: Payer: Self-pay | Admitting: Podiatry

## 2017-04-20 ENCOUNTER — Ambulatory Visit (INDEPENDENT_AMBULATORY_CARE_PROVIDER_SITE_OTHER): Payer: Medicare Other | Admitting: Podiatry

## 2017-04-20 VITALS — BP 132/64 | HR 55 | Resp 16 | Ht 68.5 in | Wt 177.0 lb

## 2017-04-20 DIAGNOSIS — M79674 Pain in right toe(s): Secondary | ICD-10-CM

## 2017-04-20 DIAGNOSIS — M779 Enthesopathy, unspecified: Secondary | ICD-10-CM | POA: Diagnosis not present

## 2017-04-20 DIAGNOSIS — D169 Benign neoplasm of bone and articular cartilage, unspecified: Secondary | ICD-10-CM | POA: Diagnosis not present

## 2017-04-20 MED ORDER — TRIAMCINOLONE ACETONIDE 10 MG/ML IJ SUSP
10.0000 mg | Freq: Once | INTRAMUSCULAR | Status: AC
Start: 2017-04-20 — End: 2017-04-20
  Administered 2017-04-20: 10 mg

## 2017-04-20 NOTE — Progress Notes (Signed)
   Subjective:    Patient ID: Derek Schneider, male    DOB: 05/23/1926, 83 y.o.   MRN: [id number]  HPI Chief Complaint  Patient presents with  . Toe Pain    Right foot; great toe; entire toe; pt stated, "For the past month, toe has hurt; walks 4 miles a day; pain they had at night has gone away now"      Review of Systems  HENT: Positive for hearing loss.   Cardiovascular: Positive for palpitations.  Gastrointestinal: Positive for constipation.  Endocrine: Positive for polyuria.  Genitourinary: Positive for frequency.  Skin: Positive for rash.  Neurological: Positive for dizziness.  Hematological: Bruises/bleeds easily.  All other systems reviewed and are negative.      Objective:   Physical Exam        Assessment & Plan:

## 2017-04-21 NOTE — Progress Notes (Signed)
Subjective:    Patient ID: Derek Schneider, male   DOB: 83 y.o.   MRN: [id number]   HPI patient presents stating he's very activities had a lot of swelling and pain in his right big toe    Review of Systems  All other systems reviewed and are negative.       Objective:  Physical Exam  Cardiovascular: Intact distal pulses.   Musculoskeletal: Normal range of motion.  Neurological: He is alert.  Skin: Skin is warm.  Nursing note and vitals reviewed.  neurovascular status intact muscle strength adequate with patient found to have inflammation pain around the interphalangeal joint right hallux with swelling no drainage or no proximal edema erythema noted. Found have good digital perfusion well oriented 3     Assessment:   Inflammatory capsulitis of the interphalangeal joint right big toe with pain      Plan:     H&P condition reviewed x-ray reviewed and today I did interphalangeal injection of the right first MPJ and interphalangeal joint with 3 mg Kenalog 5 mg Xylocaine advised on gradual increase in activities and will be seen back if symptoms persist  X-rays indicate there is arthritis of the interphalangeal joint right big toe

## 2017-04-22 DIAGNOSIS — E784 Other hyperlipidemia: Secondary | ICD-10-CM | POA: Diagnosis not present

## 2017-04-22 DIAGNOSIS — I10 Essential (primary) hypertension: Secondary | ICD-10-CM | POA: Diagnosis not present

## 2017-04-22 DIAGNOSIS — Z125 Encounter for screening for malignant neoplasm of prostate: Secondary | ICD-10-CM | POA: Diagnosis not present

## 2017-04-30 DIAGNOSIS — Z0000 Encounter for general adult medical examination without abnormal findings: Secondary | ICD-10-CM | POA: Diagnosis not present

## 2017-04-30 DIAGNOSIS — N183 Chronic kidney disease, stage 3 (moderate): Secondary | ICD-10-CM | POA: Diagnosis not present

## 2017-04-30 DIAGNOSIS — I130 Hypertensive heart and chronic kidney disease with heart failure and stage 1 through stage 4 chronic kidney disease, or unspecified chronic kidney disease: Secondary | ICD-10-CM | POA: Diagnosis not present

## 2017-04-30 DIAGNOSIS — M1990 Unspecified osteoarthritis, unspecified site: Secondary | ICD-10-CM | POA: Diagnosis not present

## 2017-04-30 DIAGNOSIS — D692 Other nonthrombocytopenic purpura: Secondary | ICD-10-CM | POA: Diagnosis not present

## 2017-04-30 DIAGNOSIS — Z1212 Encounter for screening for malignant neoplasm of rectum: Secondary | ICD-10-CM | POA: Diagnosis not present

## 2017-04-30 DIAGNOSIS — M25512 Pain in left shoulder: Secondary | ICD-10-CM | POA: Diagnosis not present

## 2017-04-30 DIAGNOSIS — I2510 Atherosclerotic heart disease of native coronary artery without angina pectoris: Secondary | ICD-10-CM | POA: Diagnosis not present

## 2017-04-30 DIAGNOSIS — Z6826 Body mass index (BMI) 26.0-26.9, adult: Secondary | ICD-10-CM | POA: Diagnosis not present

## 2017-05-06 ENCOUNTER — Other Ambulatory Visit: Payer: Medicare Other

## 2017-05-13 DIAGNOSIS — H25813 Combined forms of age-related cataract, bilateral: Secondary | ICD-10-CM | POA: Diagnosis not present

## 2017-05-13 DIAGNOSIS — D3131 Benign neoplasm of right choroid: Secondary | ICD-10-CM | POA: Diagnosis not present

## 2017-05-13 DIAGNOSIS — H25042 Posterior subcapsular polar age-related cataract, left eye: Secondary | ICD-10-CM | POA: Diagnosis not present

## 2017-05-13 DIAGNOSIS — H40013 Open angle with borderline findings, low risk, bilateral: Secondary | ICD-10-CM | POA: Diagnosis not present

## 2017-05-13 DIAGNOSIS — H43813 Vitreous degeneration, bilateral: Secondary | ICD-10-CM | POA: Diagnosis not present

## 2017-05-19 ENCOUNTER — Ambulatory Visit
Admission: RE | Admit: 2017-05-19 | Discharge: 2017-05-19 | Disposition: A | Discharge status: Home / Self Care | Payer: Medicare Other | Source: Ambulatory Visit | Attending: Otolaryngology | Admitting: Otolaryngology

## 2017-05-19 DIAGNOSIS — H903 Sensorineural hearing loss, bilateral: Secondary | ICD-10-CM

## 2017-05-19 DIAGNOSIS — D333 Benign neoplasm of cranial nerves: Secondary | ICD-10-CM

## 2017-05-19 DIAGNOSIS — H9190 Unspecified hearing loss, unspecified ear: Secondary | ICD-10-CM | POA: Diagnosis not present

## 2017-05-19 MED ORDER — GADOBENATE DIMEGLUMINE 529 MG/ML IV SOLN
15.0000 mL | Freq: Once | INTRAVENOUS | Status: AC | PRN
  Administered 2017-05-19: 15 mL via INTRAVENOUS

## 2017-07-14 DIAGNOSIS — H31092 Other chorioretinal scars, left eye: Secondary | ICD-10-CM | POA: Diagnosis not present

## 2017-07-14 DIAGNOSIS — H2513 Age-related nuclear cataract, bilateral: Secondary | ICD-10-CM | POA: Diagnosis not present

## 2017-07-14 DIAGNOSIS — H01021 Squamous blepharitis right upper eyelid: Secondary | ICD-10-CM | POA: Diagnosis not present

## 2017-07-14 DIAGNOSIS — D3131 Benign neoplasm of right choroid: Secondary | ICD-10-CM | POA: Diagnosis not present

## 2017-07-14 DIAGNOSIS — H01025 Squamous blepharitis left lower eyelid: Secondary | ICD-10-CM | POA: Diagnosis not present

## 2017-07-14 DIAGNOSIS — H01024 Squamous blepharitis left upper eyelid: Secondary | ICD-10-CM | POA: Diagnosis not present

## 2017-07-14 DIAGNOSIS — H5315 Visual distortions of shape and size: Secondary | ICD-10-CM | POA: Diagnosis not present

## 2017-07-14 DIAGNOSIS — H43811 Vitreous degeneration, right eye: Secondary | ICD-10-CM | POA: Diagnosis not present

## 2017-07-14 DIAGNOSIS — H01022 Squamous blepharitis right lower eyelid: Secondary | ICD-10-CM | POA: Diagnosis not present

## 2017-08-13 DIAGNOSIS — Z23 Encounter for immunization: Secondary | ICD-10-CM | POA: Diagnosis not present

## 2017-08-25 DIAGNOSIS — D1801 Hemangioma of skin and subcutaneous tissue: Secondary | ICD-10-CM | POA: Diagnosis not present

## 2017-08-25 DIAGNOSIS — L821 Other seborrheic keratosis: Secondary | ICD-10-CM | POA: Diagnosis not present

## 2017-08-25 DIAGNOSIS — Z85828 Personal history of other malignant neoplasm of skin: Secondary | ICD-10-CM | POA: Diagnosis not present

## 2017-08-25 DIAGNOSIS — Z0189 Encounter for other specified special examinations: Secondary | ICD-10-CM | POA: Diagnosis not present

## 2017-08-25 DIAGNOSIS — D485 Neoplasm of uncertain behavior of skin: Secondary | ICD-10-CM | POA: Diagnosis not present

## 2017-08-25 DIAGNOSIS — L0109 Other impetigo: Secondary | ICD-10-CM | POA: Diagnosis not present

## 2017-08-25 DIAGNOSIS — D225 Melanocytic nevi of trunk: Secondary | ICD-10-CM | POA: Diagnosis not present

## 2017-08-25 DIAGNOSIS — L300 Nummular dermatitis: Secondary | ICD-10-CM | POA: Diagnosis not present

## 2017-09-17 DIAGNOSIS — R3912 Poor urinary stream: Secondary | ICD-10-CM | POA: Diagnosis not present

## 2017-09-17 DIAGNOSIS — N401 Enlarged prostate with lower urinary tract symptoms: Secondary | ICD-10-CM | POA: Diagnosis not present

## 2017-10-21 ENCOUNTER — Ambulatory Visit: Payer: Medicare Other | Admitting: Podiatry

## 2017-10-22 DIAGNOSIS — J4520 Mild intermittent asthma, uncomplicated: Secondary | ICD-10-CM | POA: Diagnosis not present

## 2017-10-22 DIAGNOSIS — I10 Essential (primary) hypertension: Secondary | ICD-10-CM | POA: Diagnosis not present

## 2017-10-22 DIAGNOSIS — Z6826 Body mass index (BMI) 26.0-26.9, adult: Secondary | ICD-10-CM | POA: Diagnosis not present

## 2017-10-22 DIAGNOSIS — R05 Cough: Secondary | ICD-10-CM | POA: Diagnosis not present

## 2017-10-22 DIAGNOSIS — R509 Fever, unspecified: Secondary | ICD-10-CM | POA: Diagnosis not present

## 2017-10-26 ENCOUNTER — Ambulatory Visit: Payer: Medicare Other | Admitting: Podiatry

## 2017-11-02 ENCOUNTER — Encounter: Payer: Self-pay | Admitting: Podiatry

## 2017-11-02 ENCOUNTER — Ambulatory Visit (INDEPENDENT_AMBULATORY_CARE_PROVIDER_SITE_OTHER): Payer: Medicare Other | Admitting: Podiatry

## 2017-11-02 DIAGNOSIS — D169 Benign neoplasm of bone and articular cartilage, unspecified: Secondary | ICD-10-CM | POA: Diagnosis not present

## 2017-11-02 DIAGNOSIS — M779 Enthesopathy, unspecified: Secondary | ICD-10-CM | POA: Diagnosis not present

## 2017-11-04 NOTE — Progress Notes (Signed)
Subjective:   Patient ID: Derek Schneider, male   DOB: 84 y.o.   MRN: [id number]   HPI Patient presents stating the right foot is feeling better but there is still some swelling in that he is concerned about   ROS      Objective:  Physical Exam  Neurovascular status intact negative Homans sign noted with patient's right inner phalangeal joint hallux is mildly swollen but the redness is diminished there is no break in the skin and there is minimal discomfort with palpation     Assessment:  Improvement of inflammatory condition with mild discomfort still upon palpation     Plan:  H&P condition reviewed and recommended compression to be continued and that as long as it does not hurt will take a wait-and-see attitude but do not recommend further invasive procedures

## 2017-12-10 DIAGNOSIS — D333 Benign neoplasm of cranial nerves: Secondary | ICD-10-CM | POA: Diagnosis not present

## 2017-12-10 DIAGNOSIS — I130 Hypertensive heart and chronic kidney disease with heart failure and stage 1 through stage 4 chronic kidney disease, or unspecified chronic kidney disease: Secondary | ICD-10-CM | POA: Diagnosis not present

## 2017-12-10 DIAGNOSIS — G2581 Restless legs syndrome: Secondary | ICD-10-CM | POA: Diagnosis not present

## 2017-12-10 DIAGNOSIS — D692 Other nonthrombocytopenic purpura: Secondary | ICD-10-CM | POA: Diagnosis not present

## 2017-12-10 DIAGNOSIS — I2510 Atherosclerotic heart disease of native coronary artery without angina pectoris: Secondary | ICD-10-CM | POA: Diagnosis not present

## 2017-12-10 DIAGNOSIS — Z6826 Body mass index (BMI) 26.0-26.9, adult: Secondary | ICD-10-CM | POA: Diagnosis not present

## 2017-12-10 DIAGNOSIS — N183 Chronic kidney disease, stage 3 (moderate): Secondary | ICD-10-CM | POA: Diagnosis not present

## 2017-12-10 DIAGNOSIS — E7849 Other hyperlipidemia: Secondary | ICD-10-CM | POA: Diagnosis not present

## 2017-12-10 DIAGNOSIS — I8390 Asymptomatic varicose veins of unspecified lower extremity: Secondary | ICD-10-CM | POA: Diagnosis not present

## 2017-12-16 DIAGNOSIS — L0109 Other impetigo: Secondary | ICD-10-CM | POA: Diagnosis not present

## 2017-12-16 DIAGNOSIS — L308 Other specified dermatitis: Secondary | ICD-10-CM | POA: Diagnosis not present

## 2017-12-16 DIAGNOSIS — L570 Actinic keratosis: Secondary | ICD-10-CM | POA: Diagnosis not present

## 2017-12-16 DIAGNOSIS — Z85828 Personal history of other malignant neoplasm of skin: Secondary | ICD-10-CM | POA: Diagnosis not present

## 2017-12-16 DIAGNOSIS — L0889 Other specified local infections of the skin and subcutaneous tissue: Secondary | ICD-10-CM | POA: Diagnosis not present

## 2017-12-21 ENCOUNTER — Other Ambulatory Visit: Payer: Self-pay | Admitting: Gastroenterology

## 2017-12-28 DIAGNOSIS — H01025 Squamous blepharitis left lower eyelid: Secondary | ICD-10-CM | POA: Diagnosis not present

## 2017-12-28 DIAGNOSIS — H01024 Squamous blepharitis left upper eyelid: Secondary | ICD-10-CM | POA: Diagnosis not present

## 2017-12-28 DIAGNOSIS — H2513 Age-related nuclear cataract, bilateral: Secondary | ICD-10-CM | POA: Diagnosis not present

## 2017-12-28 DIAGNOSIS — H43811 Vitreous degeneration, right eye: Secondary | ICD-10-CM | POA: Diagnosis not present

## 2017-12-28 DIAGNOSIS — H01021 Squamous blepharitis right upper eyelid: Secondary | ICD-10-CM | POA: Diagnosis not present

## 2017-12-28 DIAGNOSIS — H5315 Visual distortions of shape and size: Secondary | ICD-10-CM | POA: Diagnosis not present

## 2017-12-28 DIAGNOSIS — H01022 Squamous blepharitis right lower eyelid: Secondary | ICD-10-CM | POA: Diagnosis not present

## 2017-12-28 DIAGNOSIS — D3131 Benign neoplasm of right choroid: Secondary | ICD-10-CM | POA: Diagnosis not present

## 2017-12-28 DIAGNOSIS — H31092 Other chorioretinal scars, left eye: Secondary | ICD-10-CM | POA: Diagnosis not present

## 2018-01-01 ENCOUNTER — Telehealth: Payer: Self-pay | Admitting: Gastroenterology

## 2018-01-01 MED ORDER — PANTOPRAZOLE SODIUM 40 MG PO TBEC: 40.0000 mg | DELAYED_RELEASE_TABLET | Freq: Every day | ORAL | 1 refills | Status: DC

## 2018-01-01 NOTE — Telephone Encounter (Signed)
Medication refilled until patient's appt in May.

## 2018-01-20 IMAGING — MR MR HEAD WO/W CM
11 of 12 series · 37 of 48 positions shown · IV contrast (15ml multihance)
Comparison: MRI head 04/23/2016, 04/30/2015

CLINICAL DATA: Benign neoplasm of cranial nerve. Sensory hearing
loss. Follow-up vestibular schwannoma

EXAM:
MRI HEAD WITHOUT AND WITH CONTRAST
TECHNIQUE: Multiplanar, multiecho pulse sequences of the brain and surrounding
structures were obtained without and with intravenous contrast.
CONTRAST:  15mL MULTIHANCE GADOBENATE DIMEGLUMINE 529 MG/ML IV SOLN

[Series 2: T1 · sagittal · 5.0mm · 0.45mm/px · 2 of 21 slices shown (1 of 3)]
[im 1/21]
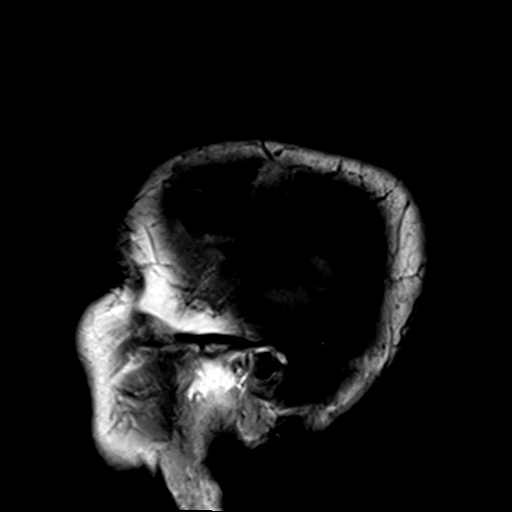
[im 21/21]
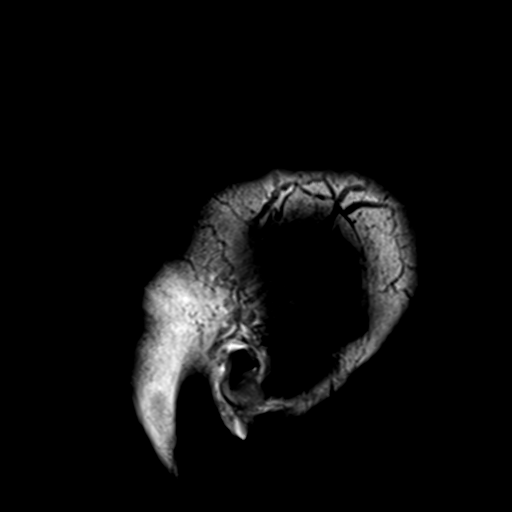

[Series 3: DWI · axial · 3.0mm · 1.80mm/px · z∈[-6,+138]mm · 8 of 100 slices shown (1 of 2)]
[im 1/100]
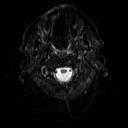
[im 12/100]
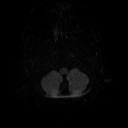
[im 34/100]
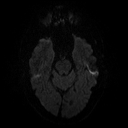
[im 45/100]
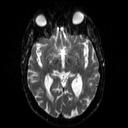
[im 56/100]
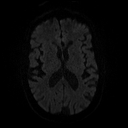
[im 67/100]
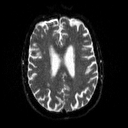
[im 89/100]
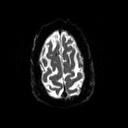
[im 100/100]
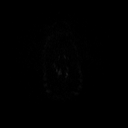

[Series 4: DWI · axial · 3.0mm · 1.80mm/px · z∈[-6,+138]mm · 5 of 47 slices shown (2 of 2)]
[im 1/47]
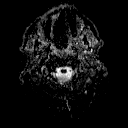
[im 12/47]
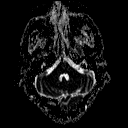
[im 24/47]
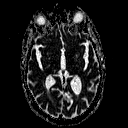
[im 35/47]
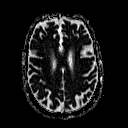
[im 47/47]
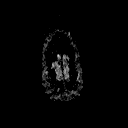

[Series 5: T2 · axial · 5.0mm · 0.45mm/px · z∈[-3,+143]mm · 3 of 24 slices shown]
[im 1/24]
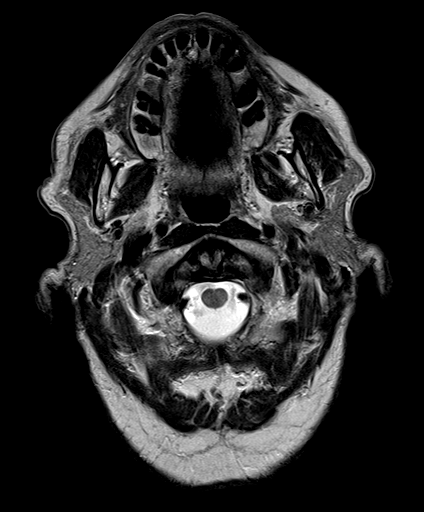
[im 12/24]
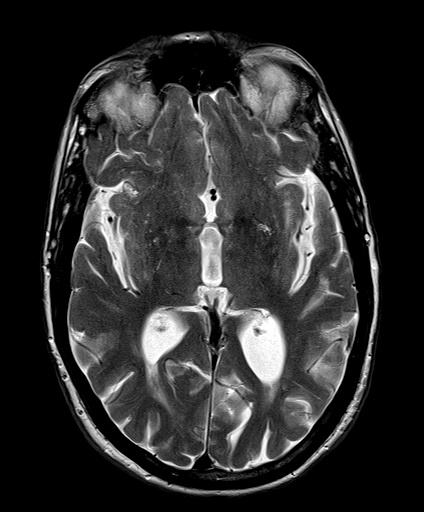
[im 24/24]
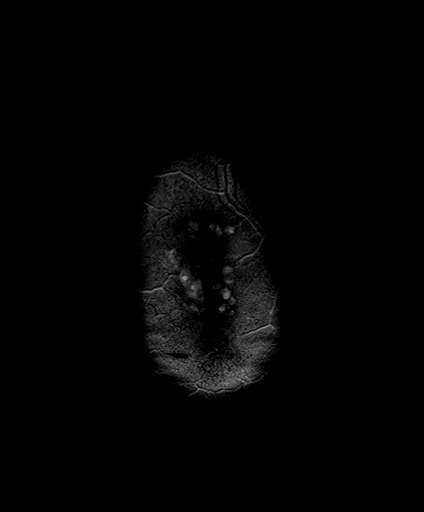

[Series 6: FLAIR · axial · 3.0mm · 0.45mm/px · z∈[-11,+140]mm · 4 of 34 slices shown]
[im 1/34]
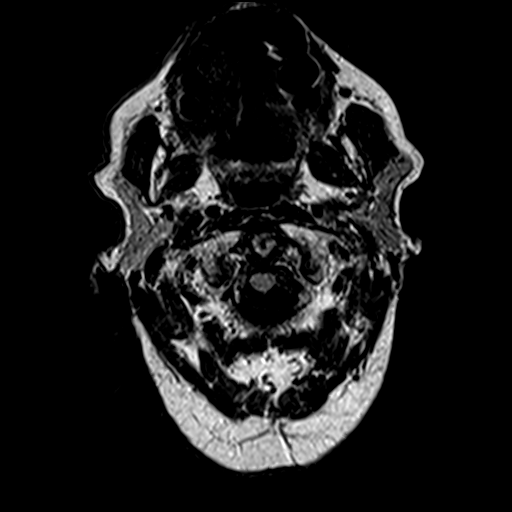
[im 12/34]
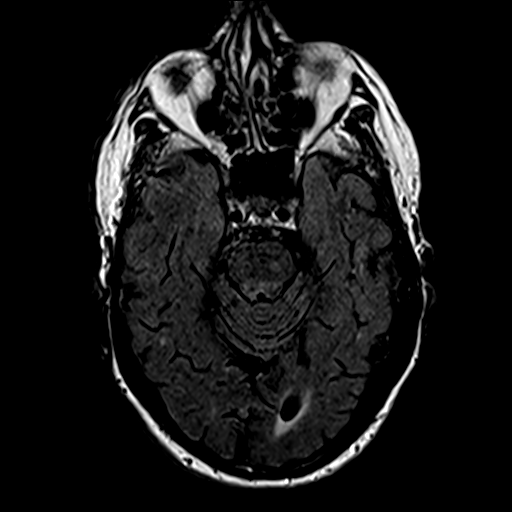
[im 23/34]
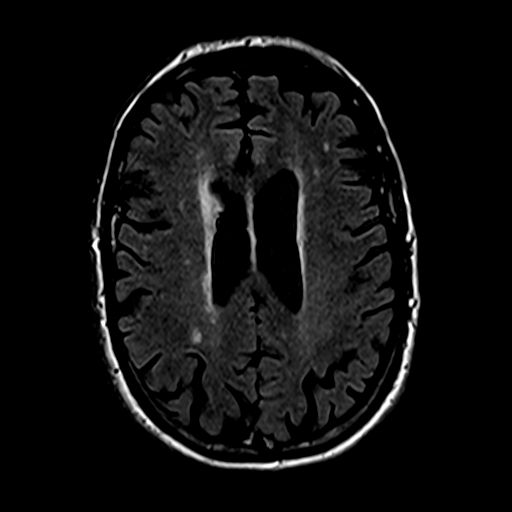
[im 34/34]
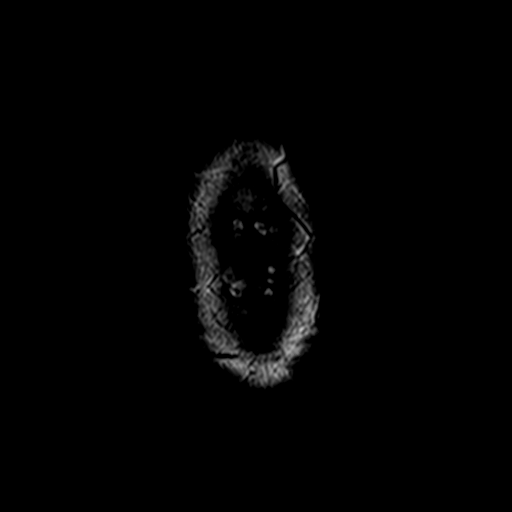

[Series 8: swi_images · axial · 2.0mm · 0.90mm/px · z∈[-12,+143]mm · 8 of 80 slices shown]
[im 1/80]
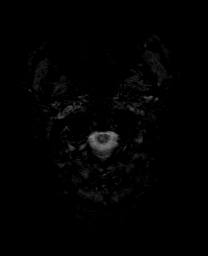
[im 12/80]
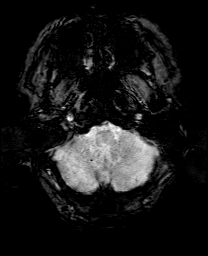
[im 23/80]
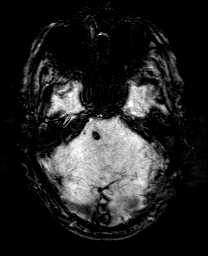
[im 34/80]
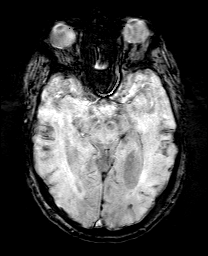
[im 46/80]
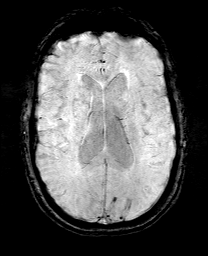
[im 57/80]
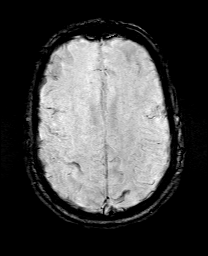
[im 68/80]
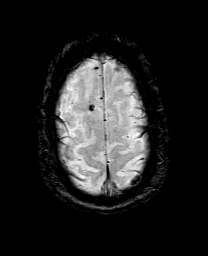
[im 80/80]
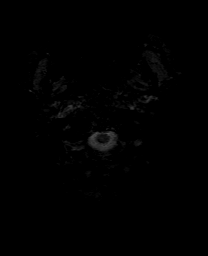

[Series 9: T1 · coronal · 3.0mm · 0.35mm/px · 1 of 11 slices shown (2 of 3)]
[im 1/11]
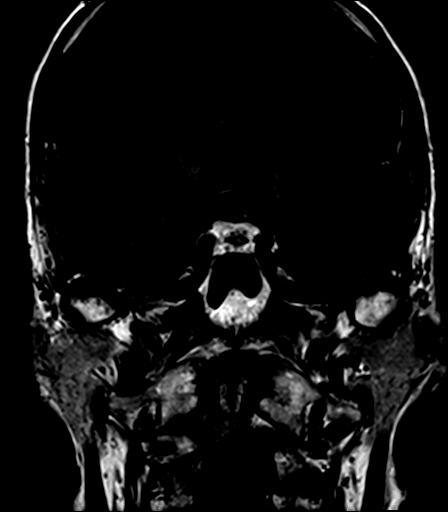

[Series 10: T1 · axial · 3.0mm · 0.35mm/px · 1 of 11 slices shown (3 of 3)]
[im 1/11]
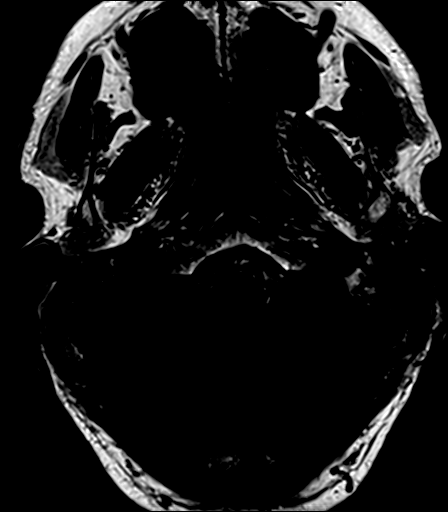

[Series 11: bSSFP · axial · 1.0mm · 0.28mm/px · z∈[+1,+24]mm · 3 of 36 slices shown]
[im 1/36]
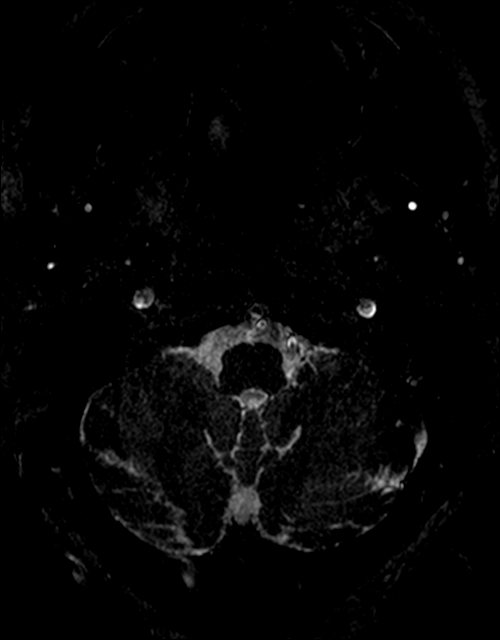
[im 12/36]
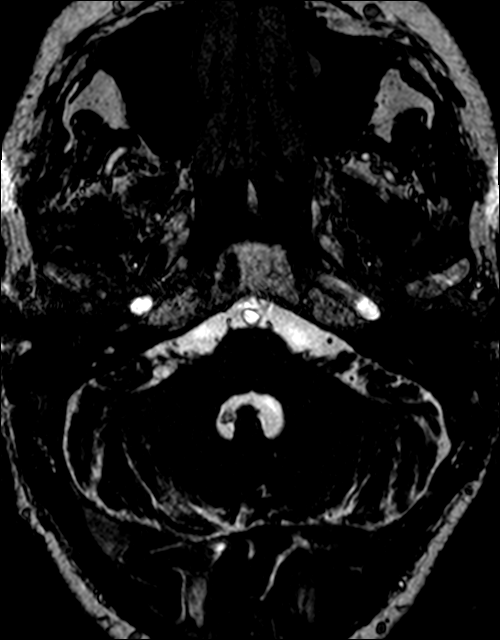
[im 24/36]
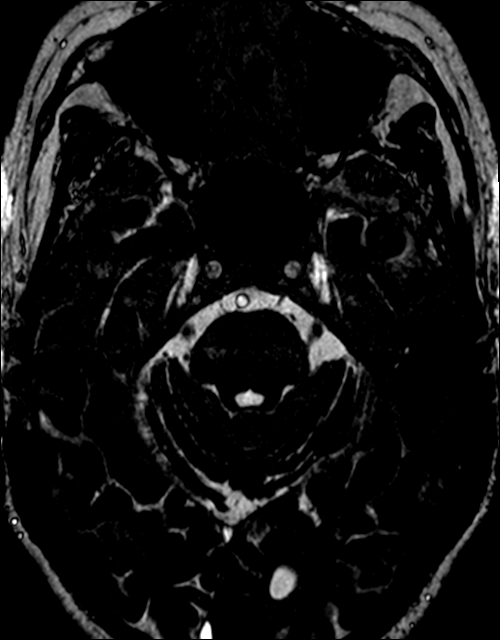

[Series 12: T1 post-contrast · coronal · 3.0mm · 0.35mm/px · 1 of 11 slices shown (1 of 2)]
[im 1/11]
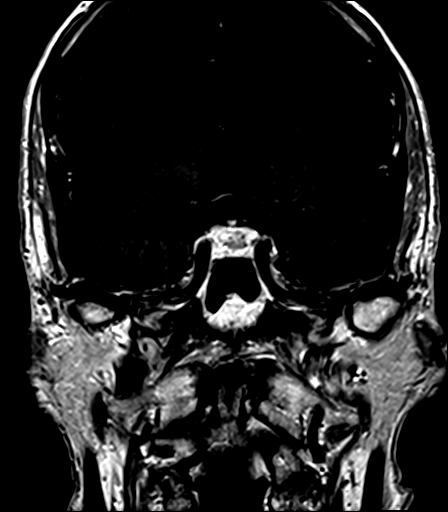

[Series 13: T1 post-contrast · axial · 3.0mm · 0.35mm/px · 1 of 11 slices shown (2 of 2)]
[im 1/11]
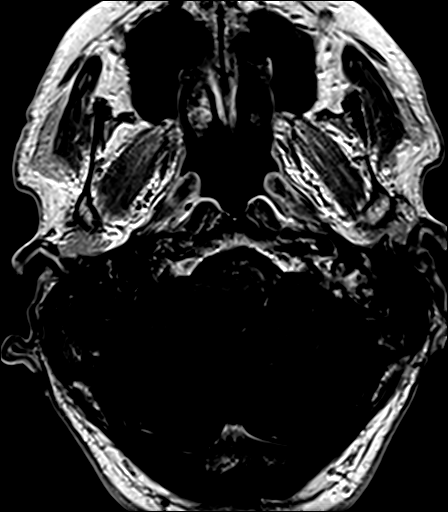

[37 of 48 positions shown; findings below may reference images not displayed]

FINDINGS: Brain: IAC protocol was performed including thin section imaging
through the posterior fossa before and after intravenous contrast.

Enhancing mass in the right internal auditory canal is unchanged.
The mass measures 5 x 9 x 4 cm. The majority of the mass is
intracanalicular with a small amount extending into the cistern. The
mass shows homogeneous enhancement. Internal auditory canal normal
on the left.

Enhancing vessel within the right pons is unchanged from prior
studies most compatible with occult vascular malformation.

Mild atrophy. Negative for acute infarct. Chronic microvascular
ischemic changes in the white matter are mild. Scattered areas of
chronic microhemorrhage are present in the brain including in the
right lateral pons. These are unchanged. These may be related to
hypertension or possibly cerebral amyloid. No other mass lesion
identified.

Vascular: Normal arterial flow voids

Skull and upper cervical spine: Negative

Sinuses/Orbits: Negative

Other: None
IMPRESSION: Vestibular schwannoma on the right is unchanged from prior studies.

Chronic microvascular ischemia. Multiple areas of chronic
microhemorrhage in the brain are present, possibly due to poorly
controlled hypertension or cerebral amyloid.

## 2018-02-15 DIAGNOSIS — E785 Hyperlipidemia, unspecified: Secondary | ICD-10-CM | POA: Diagnosis not present

## 2018-02-15 DIAGNOSIS — I10 Essential (primary) hypertension: Secondary | ICD-10-CM | POA: Diagnosis not present

## 2018-02-15 DIAGNOSIS — I119 Hypertensive heart disease without heart failure: Secondary | ICD-10-CM | POA: Diagnosis not present

## 2018-02-15 DIAGNOSIS — I493 Ventricular premature depolarization: Secondary | ICD-10-CM | POA: Diagnosis not present

## 2018-02-15 DIAGNOSIS — I2510 Atherosclerotic heart disease of native coronary artery without angina pectoris: Secondary | ICD-10-CM | POA: Diagnosis not present

## 2018-03-02 ENCOUNTER — Ambulatory Visit (INDEPENDENT_AMBULATORY_CARE_PROVIDER_SITE_OTHER): Payer: Medicare Other | Admitting: Gastroenterology

## 2018-03-02 ENCOUNTER — Encounter: Payer: Self-pay | Admitting: Gastroenterology

## 2018-03-02 VITALS — BP 140/70 | HR 78 | Ht 67.0 in | Wt 173.4 lb

## 2018-03-02 DIAGNOSIS — K219 Gastro-esophageal reflux disease without esophagitis: Secondary | ICD-10-CM | POA: Diagnosis not present

## 2018-03-02 MED ORDER — PANTOPRAZOLE SODIUM 40 MG PO TBEC: 40.0000 mg | DELAYED_RELEASE_TABLET | Freq: Every day | ORAL | 11 refills | Status: DC

## 2018-03-02 NOTE — Progress Notes (Signed)
Review of pertinent gastrointestinal problems: 1. adenomatous colon polyps. Several colonoscopies by Dr. Verl Blalock, the first was in 2002. He has had multiple adenomatous polyps removed from his colon over the years. He had a colonoscopy in 2009, a cecal 1.7 tubular villous adenoma was removed as well as another polyp in his colon. Followup colonoscopy one year later showed no residual adenomatous polyps and he was recommended to have repeat colonoscopy at 5 year interval, which would be at the age of 89.  2. GERD: EGD, Dr. Verl Blalock, 2008 for chest pain and reflux symptoms.  He documented a proximal esophageal partial stricture at 14 mm diameter.  Inlet pouch in his proximal esophagus biopsied, hiatal hernia 4 cm, erythematous granular mucosa in his stomach.  Biopsies from all these areas showed no significant pathology.  Changed care to Dr. Ardis Hughs 2016, he had GERD without any alarm symptoms and his proton pump inhibitors were refilled.   HPI: This is a very pleasant 84 year old man whom I last saw about 3 years ago.  He returns to discuss his chronic GERD symptoms.  His pyrosis is usually very well controlled on once daily pantoprazole.  Sometimes he will have some overnight burning, acid issues.  He has no dysphasia, no weight loss and no overt GI bleeding  Chief complaint is chronic GERD  ROS: complete GI ROS as described in HPI, all other review negative.  Constitutional:  No unintentional weight loss   Past Medical History:  Diagnosis Date  . Acoustic neuroma (HCC)    Right ear  . Asthma   . Bronchitis, not specified as acute or chronic   . Diverticulosis of colon (without mention of hemorrhage)   . Hiatal hernia   . Internal hemorrhoids without mention of complication   . Personal history of colonic polyps 01/12/2008   TUBULOVILLOUS ADENOMA  . PVC (premature ventricular contraction)   . Stricture and stenosis of esophagus   . Unspecified essential hypertension   .  Unspecified gastritis and gastroduodenitis without mention of hemorrhage     Past Surgical History:  Procedure Laterality Date  . CARDIAC CATHETERIZATION    . KNEE SURGERY  as a child   left patella removal as a child  . TESTICLAR CYST EXCISION    . TONSILLECTOMY      Current Outpatient Medications  Medication Sig Dispense Refill  . albuterol (PROVENTIL HFA;VENTOLIN HFA) 108 (90 BASE) MCG/ACT inhaler Inhale 1 puff into the lungs every 6 (six) hours as needed for wheezing or shortness of breath.    Marland Kitchen aspirin 81 MG tablet Take 81 mg by mouth every other day.     Marland Kitchen atorvastatin (LIPITOR) 20 MG tablet Take 20 mg by mouth daily.    . cholecalciferol (VITAMIN D) 1000 UNITS tablet Take 1,000 Units by mouth daily.    . finasteride (PROSCAR) 5 MG tablet Take 30 mg by mouth daily.     Marland Kitchen losartan-hydrochlorothiazide (HYZAAR) 100-25 MG per tablet Take 1 tablet by mouth daily.     Marland Kitchen NIFEdipine (PROCARDIA XL/ADALAT-CC) 30 MG 24 hr tablet Take 30 mg by mouth daily.    . pantoprazole (PROTONIX) 40 MG tablet Take 1 tablet (40 mg total) by mouth daily. 30 tablet 1  . Tamsulosin HCl (FLOMAX) 0.4 MG CAPS Take 0.4 mg by mouth daily.    Marland Kitchen triamcinolone (KENALOG) 0.025 % cream Apply 1 application topically as needed.     No current facility-administered medications for this visit.     Allergies as of  03/02/2018  . (No Known Allergies)    Family History  Problem Relation Age of Onset  . Lung cancer Brother   . Heart disease Father   . Kidney disease Father   . Cancer Brother     Social History   Socioeconomic History  . Marital status: Married    Spouse name: Not on file  . Number of children: 5  . Years of education: Not on file  . Highest education level: Not on file  Occupational History  . Occupation: Self employed    Employer: RETIRED  Social Needs  . Financial resource strain: Not on file  . Food insecurity:    Worry: Not on file    Inability: Not on file  . Transportation  needs:    Medical: Not on file    Non-medical: Not on file  Tobacco Use  . Smoking status: Never Smoker  . Smokeless tobacco: Never Used  Substance and Sexual Activity  . Alcohol use: No  . Drug use: No  . Sexual activity: Not on file  Lifestyle  . Physical activity:    Days per week: Not on file    Minutes per session: Not on file  . Stress: Not on file  Relationships  . Social connections:    Talks on phone: Not on file    Gets together: Not on file    Attends religious service: Not on file    Active member of club or organization: Not on file    Attends meetings of clubs or organizations: Not on file    Relationship status: Not on file  . Intimate partner violence:    Fear of current or ex partner: Not on file    Emotionally abused: Not on file    Physically abused: Not on file    Forced sexual activity: Not on file  Other Topics Concern  . Not on file  Social History Narrative  . Not on file     Physical Exam: BP 140/70   Pulse 78   Ht 5\' 7"  (1.702 m)   Wt 173 lb 6 oz (78.6 kg)   BMI 27.15 kg/m  Constitutional: generally well-appearing Psychiatric: alert and oriented x3 Abdomen: soft, nontender, nondistended, no obvious ascites, no peritoneal signs, normal bowel sounds No peripheral edema noted in lower extremities  Assessment and plan: 84 y.o. male with chronic GERD without alarm symptoms  First happy to refill his pantoprazole at 40 mg, 1 pill once daily shortly before breakfast.  I recommended that he try over-the-counter H2 blocker ranitidine 50 mg pills at bedtime every night as that is a very good way to control overnight acid symptoms.  He will call to report on his response in 3 to 4 weeks.  I am happy to refill the proton pump inhibitor in 1 year from now but if he still needs prescription strength medicine in 2 years I would like to see him again in the office.  Please see the "Patient Instructions" section for addition details about the  plan.  Derek Loffler, MD [facility] 03/02/2018, 1:50 PM

## 2018-03-02 NOTE — Patient Instructions (Addendum)
Refill protonix 40mg  once daily, 30 pills, 11 refills.  Take 1 pill 20 to 30 minutes before your breakfast meal every day. Please also start ranitidine 150 mg pills, 1 pill at bedtime every night Please call to report on your response in 3 to 4 weeks.  Normal BMI (Body Mass Index- based on height and weight) is between 23 and 30. Your BMI today is Body mass index is 27.15 kg/m. Marland Kitchen Please consider follow up  regarding your BMI with your Primary Care Provider.

## 2018-03-12 DIAGNOSIS — Z0000 Encounter for general adult medical examination without abnormal findings: Secondary | ICD-10-CM | POA: Diagnosis not present

## 2018-03-12 DIAGNOSIS — J45909 Unspecified asthma, uncomplicated: Secondary | ICD-10-CM | POA: Diagnosis not present

## 2018-03-12 DIAGNOSIS — I10 Essential (primary) hypertension: Secondary | ICD-10-CM | POA: Diagnosis not present

## 2018-03-12 DIAGNOSIS — K5900 Constipation, unspecified: Secondary | ICD-10-CM | POA: Diagnosis not present

## 2018-03-15 DIAGNOSIS — I10 Essential (primary) hypertension: Secondary | ICD-10-CM | POA: Diagnosis not present

## 2018-03-15 DIAGNOSIS — L570 Actinic keratosis: Secondary | ICD-10-CM | POA: Diagnosis not present

## 2018-03-15 DIAGNOSIS — L308 Other specified dermatitis: Secondary | ICD-10-CM | POA: Diagnosis not present

## 2018-03-15 DIAGNOSIS — L989 Disorder of the skin and subcutaneous tissue, unspecified: Secondary | ICD-10-CM | POA: Diagnosis not present

## 2018-03-15 DIAGNOSIS — N400 Enlarged prostate without lower urinary tract symptoms: Secondary | ICD-10-CM | POA: Diagnosis not present

## 2018-03-18 DIAGNOSIS — L570 Actinic keratosis: Secondary | ICD-10-CM | POA: Diagnosis not present

## 2018-03-18 DIAGNOSIS — K224 Dyskinesia of esophagus: Secondary | ICD-10-CM | POA: Diagnosis not present

## 2018-03-18 DIAGNOSIS — I10 Essential (primary) hypertension: Secondary | ICD-10-CM | POA: Diagnosis not present

## 2018-03-18 DIAGNOSIS — N400 Enlarged prostate without lower urinary tract symptoms: Secondary | ICD-10-CM | POA: Diagnosis not present

## 2018-03-18 DIAGNOSIS — Z86010 Personal history of colonic polyps: Secondary | ICD-10-CM | POA: Diagnosis not present

## 2018-03-25 DIAGNOSIS — K224 Dyskinesia of esophagus: Secondary | ICD-10-CM | POA: Diagnosis not present

## 2018-03-25 DIAGNOSIS — I10 Essential (primary) hypertension: Secondary | ICD-10-CM | POA: Diagnosis not present

## 2018-03-25 DIAGNOSIS — K219 Gastro-esophageal reflux disease without esophagitis: Secondary | ICD-10-CM | POA: Diagnosis not present

## 2018-03-25 DIAGNOSIS — F4320 Adjustment disorder, unspecified: Secondary | ICD-10-CM | POA: Diagnosis not present

## 2018-03-25 DIAGNOSIS — L989 Disorder of the skin and subcutaneous tissue, unspecified: Secondary | ICD-10-CM | POA: Diagnosis not present

## 2018-03-25 DIAGNOSIS — L308 Other specified dermatitis: Secondary | ICD-10-CM | POA: Diagnosis not present

## 2018-03-25 DIAGNOSIS — L280 Lichen simplex chronicus: Secondary | ICD-10-CM | POA: Diagnosis not present

## 2018-03-29 ENCOUNTER — Other Ambulatory Visit: Payer: Self-pay | Admitting: Family Medicine

## 2018-03-29 DIAGNOSIS — Z8719 Personal history of other diseases of the digestive system: Secondary | ICD-10-CM

## 2018-03-29 DIAGNOSIS — R1310 Dysphagia, unspecified: Secondary | ICD-10-CM

## 2018-03-31 ENCOUNTER — Telehealth: Payer: Self-pay | Admitting: Gastroenterology

## 2018-03-31 NOTE — Telephone Encounter (Signed)
Pt is requesting to speak with Dr. Ardis Hughs nurse. He stated that it is regarding his PCP but did not disclose more information.

## 2018-03-31 NOTE — Telephone Encounter (Signed)
Left a message for patient to call back. 

## 2018-03-31 NOTE — Telephone Encounter (Signed)
I think it would be fine to change him to [place].  60 mg pills, take 1 pill once daily shortly before breakfast, dispense 30 with 11 refills.  He should stop the Protonix.   It is certainly okay for him to go ahead with the swallow testing that was ordered by his primary care physician

## 2018-03-31 NOTE — Telephone Encounter (Signed)
Spoke with patient and he states he has been taking his Protonix 40 mg in AM and Ranitidine 150 mg po at bedtime. Reports the Ranitidine "Helps sporadically." He asking for a change to Dexilant. States he had this in the past and it worked well. Also, reports his PCP is scheduling him for an esophageal "swallow test" due to some issues in his chest a night. He wants to be sure Dr. Ardis Hughs is okay with this.

## 2018-04-02 ENCOUNTER — Other Ambulatory Visit: Payer: Self-pay

## 2018-04-02 MED ORDER — DEXLANSOPRAZOLE 60 MG PO CPDR: 60.0000 mg | DELAYED_RELEASE_CAPSULE | Freq: Every day | ORAL | 11 refills | Status: DC

## 2018-04-02 NOTE — Telephone Encounter (Signed)
Spoke to patient, he understands to stop Protonix and we will send in new Rx for the Dexilant 60 mg once daily prior to breakfast. He will keep the swallow test his PCP ordered.

## 2018-04-02 NOTE — Telephone Encounter (Signed)
Pt states he is returning phone call to nurse best call back [phone].

## 2018-04-05 ENCOUNTER — Other Ambulatory Visit: Payer: Self-pay | Admitting: Gastroenterology

## 2018-04-05 DIAGNOSIS — L570 Actinic keratosis: Secondary | ICD-10-CM | POA: Diagnosis not present

## 2018-04-05 DIAGNOSIS — T148XXA Other injury of unspecified body region, initial encounter: Secondary | ICD-10-CM | POA: Diagnosis not present

## 2018-04-05 DIAGNOSIS — I10 Essential (primary) hypertension: Secondary | ICD-10-CM | POA: Diagnosis not present

## 2018-04-07 ENCOUNTER — Ambulatory Visit
Admission: RE | Admit: 2018-04-07 | Discharge: 2018-04-07 | Disposition: A | Discharge status: Home / Self Care | Payer: Medicare Other | Source: Ambulatory Visit | Attending: Family Medicine | Admitting: Family Medicine

## 2018-04-07 DIAGNOSIS — Z8719 Personal history of other diseases of the digestive system: Secondary | ICD-10-CM

## 2018-04-07 DIAGNOSIS — R1310 Dysphagia, unspecified: Secondary | ICD-10-CM

## 2018-04-07 DIAGNOSIS — K219 Gastro-esophageal reflux disease without esophagitis: Secondary | ICD-10-CM | POA: Diagnosis not present

## 2018-04-07 DIAGNOSIS — K449 Diaphragmatic hernia without obstruction or gangrene: Secondary | ICD-10-CM | POA: Diagnosis not present

## 2018-04-10 DIAGNOSIS — R42 Dizziness and giddiness: Secondary | ICD-10-CM | POA: Diagnosis not present

## 2018-04-10 DIAGNOSIS — I491 Atrial premature depolarization: Secondary | ICD-10-CM | POA: Diagnosis not present

## 2018-04-12 ENCOUNTER — Telehealth: Payer: Self-pay

## 2018-04-12 DIAGNOSIS — F4320 Adjustment disorder, unspecified: Secondary | ICD-10-CM | POA: Diagnosis not present

## 2018-04-12 DIAGNOSIS — T148XXD Other injury of unspecified body region, subsequent encounter: Secondary | ICD-10-CM | POA: Diagnosis not present

## 2018-04-12 DIAGNOSIS — I10 Essential (primary) hypertension: Secondary | ICD-10-CM | POA: Diagnosis not present

## 2018-04-12 NOTE — Telephone Encounter (Signed)
Spoke to patient to inform him that the PA for Dexilant 60mg  daily has been approved. He has enough refills for a year. Patient voiced understanding.

## 2018-05-11 DIAGNOSIS — K224 Dyskinesia of esophagus: Secondary | ICD-10-CM | POA: Diagnosis not present

## 2018-05-11 DIAGNOSIS — F4320 Adjustment disorder, unspecified: Secondary | ICD-10-CM | POA: Diagnosis not present

## 2018-05-11 DIAGNOSIS — R499 Unspecified voice and resonance disorder: Secondary | ICD-10-CM | POA: Diagnosis not present

## 2018-06-18 ENCOUNTER — Other Ambulatory Visit: Payer: Self-pay | Admitting: Otolaryngology

## 2018-06-18 DIAGNOSIS — D333 Benign neoplasm of cranial nerves: Secondary | ICD-10-CM

## 2018-06-28 DIAGNOSIS — L03116 Cellulitis of left lower limb: Secondary | ICD-10-CM | POA: Diagnosis not present

## 2018-06-28 DIAGNOSIS — M659 Synovitis and tenosynovitis, unspecified: Secondary | ICD-10-CM | POA: Diagnosis not present

## 2018-06-28 DIAGNOSIS — M25561 Pain in right knee: Secondary | ICD-10-CM | POA: Diagnosis not present

## 2018-06-28 DIAGNOSIS — L309 Dermatitis, unspecified: Secondary | ICD-10-CM | POA: Diagnosis not present

## 2018-06-30 ENCOUNTER — Other Ambulatory Visit: Payer: Self-pay | Admitting: Family Medicine

## 2018-06-30 ENCOUNTER — Ambulatory Visit
Admission: RE | Admit: 2018-06-30 | Discharge: 2018-06-30 | Disposition: A | Discharge status: Home / Self Care | Payer: Medicare Other | Source: Ambulatory Visit | Attending: Family Medicine | Admitting: Family Medicine

## 2018-06-30 DIAGNOSIS — R52 Pain, unspecified: Secondary | ICD-10-CM

## 2018-06-30 DIAGNOSIS — M1712 Unilateral primary osteoarthritis, left knee: Secondary | ICD-10-CM | POA: Diagnosis not present

## 2018-06-30 DIAGNOSIS — I10 Essential (primary) hypertension: Secondary | ICD-10-CM | POA: Diagnosis not present

## 2018-06-30 DIAGNOSIS — L03116 Cellulitis of left lower limb: Secondary | ICD-10-CM | POA: Diagnosis not present

## 2018-06-30 DIAGNOSIS — L309 Dermatitis, unspecified: Secondary | ICD-10-CM | POA: Diagnosis not present

## 2018-06-30 DIAGNOSIS — L03119 Cellulitis of unspecified part of limb: Secondary | ICD-10-CM | POA: Diagnosis not present

## 2018-06-30 DIAGNOSIS — M25562 Pain in left knee: Secondary | ICD-10-CM | POA: Diagnosis not present

## 2018-07-01 DIAGNOSIS — L03116 Cellulitis of left lower limb: Secondary | ICD-10-CM | POA: Diagnosis not present

## 2018-07-01 DIAGNOSIS — L309 Dermatitis, unspecified: Secondary | ICD-10-CM | POA: Diagnosis not present

## 2018-07-01 DIAGNOSIS — L02419 Cutaneous abscess of limb, unspecified: Secondary | ICD-10-CM | POA: Diagnosis not present

## 2018-07-01 DIAGNOSIS — M25562 Pain in left knee: Secondary | ICD-10-CM | POA: Diagnosis not present

## 2018-07-07 DIAGNOSIS — H01021 Squamous blepharitis right upper eyelid: Secondary | ICD-10-CM | POA: Diagnosis not present

## 2018-07-07 DIAGNOSIS — H01022 Squamous blepharitis right lower eyelid: Secondary | ICD-10-CM | POA: Diagnosis not present

## 2018-07-07 DIAGNOSIS — H31092 Other chorioretinal scars, left eye: Secondary | ICD-10-CM | POA: Diagnosis not present

## 2018-07-07 DIAGNOSIS — H43811 Vitreous degeneration, right eye: Secondary | ICD-10-CM | POA: Diagnosis not present

## 2018-07-07 DIAGNOSIS — H2513 Age-related nuclear cataract, bilateral: Secondary | ICD-10-CM | POA: Diagnosis not present

## 2018-07-07 DIAGNOSIS — H01024 Squamous blepharitis left upper eyelid: Secondary | ICD-10-CM | POA: Diagnosis not present

## 2018-07-07 DIAGNOSIS — H01025 Squamous blepharitis left lower eyelid: Secondary | ICD-10-CM | POA: Diagnosis not present

## 2018-07-07 DIAGNOSIS — D3131 Benign neoplasm of right choroid: Secondary | ICD-10-CM | POA: Diagnosis not present

## 2018-07-07 DIAGNOSIS — H5315 Visual distortions of shape and size: Secondary | ICD-10-CM | POA: Diagnosis not present

## 2018-07-08 ENCOUNTER — Ambulatory Visit
Admission: RE | Admit: 2018-07-08 | Discharge: 2018-07-08 | Disposition: A | Discharge status: Home / Self Care | Payer: Medicare Other | Source: Ambulatory Visit | Attending: Otolaryngology | Admitting: Otolaryngology

## 2018-07-08 DIAGNOSIS — H9191 Unspecified hearing loss, right ear: Secondary | ICD-10-CM | POA: Diagnosis not present

## 2018-07-08 DIAGNOSIS — D333 Benign neoplasm of cranial nerves: Secondary | ICD-10-CM

## 2018-07-08 MED ORDER — GADOBENATE DIMEGLUMINE 529 MG/ML IV SOLN
8.0000 mL | Freq: Once | INTRAVENOUS | Status: AC | PRN
  Administered 2018-07-08: 8 mL via INTRAVENOUS

## 2018-07-11 DIAGNOSIS — L03116 Cellulitis of left lower limb: Secondary | ICD-10-CM | POA: Diagnosis not present

## 2018-07-11 DIAGNOSIS — L309 Dermatitis, unspecified: Secondary | ICD-10-CM | POA: Diagnosis not present

## 2018-07-11 DIAGNOSIS — E785 Hyperlipidemia, unspecified: Secondary | ICD-10-CM | POA: Diagnosis not present

## 2018-07-11 DIAGNOSIS — Z23 Encounter for immunization: Secondary | ICD-10-CM | POA: Diagnosis not present

## 2018-07-11 DIAGNOSIS — N183 Chronic kidney disease, stage 3 (moderate): Secondary | ICD-10-CM | POA: Diagnosis not present

## 2018-07-16 DIAGNOSIS — L0109 Other impetigo: Secondary | ICD-10-CM | POA: Diagnosis not present

## 2018-07-16 DIAGNOSIS — L0101 Non-bullous impetigo: Secondary | ICD-10-CM | POA: Diagnosis not present

## 2018-07-16 DIAGNOSIS — Z85828 Personal history of other malignant neoplasm of skin: Secondary | ICD-10-CM | POA: Diagnosis not present

## 2018-08-12 DIAGNOSIS — J45909 Unspecified asthma, uncomplicated: Secondary | ICD-10-CM | POA: Diagnosis not present

## 2018-08-12 DIAGNOSIS — J309 Allergic rhinitis, unspecified: Secondary | ICD-10-CM | POA: Diagnosis not present

## 2018-08-27 DIAGNOSIS — L821 Other seborrheic keratosis: Secondary | ICD-10-CM | POA: Diagnosis not present

## 2018-08-27 DIAGNOSIS — L0109 Other impetigo: Secondary | ICD-10-CM | POA: Diagnosis not present

## 2018-08-27 DIAGNOSIS — L0889 Other specified local infections of the skin and subcutaneous tissue: Secondary | ICD-10-CM | POA: Diagnosis not present

## 2018-08-27 DIAGNOSIS — L570 Actinic keratosis: Secondary | ICD-10-CM | POA: Diagnosis not present

## 2018-08-27 DIAGNOSIS — D485 Neoplasm of uncertain behavior of skin: Secondary | ICD-10-CM | POA: Diagnosis not present

## 2018-08-27 DIAGNOSIS — R21 Rash and other nonspecific skin eruption: Secondary | ICD-10-CM | POA: Diagnosis not present

## 2018-08-27 DIAGNOSIS — Z85828 Personal history of other malignant neoplasm of skin: Secondary | ICD-10-CM | POA: Diagnosis not present

## 2018-09-13 DIAGNOSIS — Z85828 Personal history of other malignant neoplasm of skin: Secondary | ICD-10-CM | POA: Diagnosis not present

## 2018-09-13 DIAGNOSIS — L300 Nummular dermatitis: Secondary | ICD-10-CM | POA: Diagnosis not present

## 2018-09-16 DIAGNOSIS — H905 Unspecified sensorineural hearing loss: Secondary | ICD-10-CM | POA: Diagnosis not present

## 2018-09-16 DIAGNOSIS — D333 Benign neoplasm of cranial nerves: Secondary | ICD-10-CM | POA: Diagnosis not present

## 2018-09-16 DIAGNOSIS — H6123 Impacted cerumen, bilateral: Secondary | ICD-10-CM | POA: Diagnosis not present

## 2018-09-16 DIAGNOSIS — H9313 Tinnitus, bilateral: Secondary | ICD-10-CM | POA: Diagnosis not present

## 2018-09-16 DIAGNOSIS — H903 Sensorineural hearing loss, bilateral: Secondary | ICD-10-CM | POA: Diagnosis not present

## 2018-09-22 DIAGNOSIS — R351 Nocturia: Secondary | ICD-10-CM | POA: Diagnosis not present

## 2018-09-22 DIAGNOSIS — R3912 Poor urinary stream: Secondary | ICD-10-CM | POA: Diagnosis not present

## 2018-09-22 DIAGNOSIS — N5201 Erectile dysfunction due to arterial insufficiency: Secondary | ICD-10-CM | POA: Diagnosis not present

## 2018-09-22 DIAGNOSIS — N401 Enlarged prostate with lower urinary tract symptoms: Secondary | ICD-10-CM | POA: Diagnosis not present

## 2018-09-27 DIAGNOSIS — R079 Chest pain, unspecified: Secondary | ICD-10-CM | POA: Diagnosis not present

## 2018-09-27 DIAGNOSIS — I4891 Unspecified atrial fibrillation: Secondary | ICD-10-CM | POA: Diagnosis not present

## 2018-09-28 DIAGNOSIS — I209 Angina pectoris, unspecified: Secondary | ICD-10-CM | POA: Diagnosis not present

## 2018-09-28 DIAGNOSIS — I10 Essential (primary) hypertension: Secondary | ICD-10-CM | POA: Diagnosis not present

## 2018-09-28 DIAGNOSIS — I4891 Unspecified atrial fibrillation: Secondary | ICD-10-CM | POA: Diagnosis not present

## 2018-09-30 DIAGNOSIS — I10 Essential (primary) hypertension: Secondary | ICD-10-CM | POA: Diagnosis not present

## 2018-09-30 DIAGNOSIS — A0100 Typhoid fever, unspecified: Secondary | ICD-10-CM | POA: Diagnosis not present

## 2018-09-30 DIAGNOSIS — E559 Vitamin D deficiency, unspecified: Secondary | ICD-10-CM | POA: Diagnosis not present

## 2018-10-11 ENCOUNTER — Telehealth: Payer: Self-pay | Admitting: Family Medicine

## 2018-10-11 NOTE — Telephone Encounter (Signed)
Patient called back and cancelled Dr. Blenda Mounts appointment.  He stated that Dr. Dennie Fetters first choice was Dr. Acie Fredrickson and that's who he wanted the appointment to be with (even though I explained to him that the referral was in Dr. Blenda Mounts name.)

## 2018-10-19 ENCOUNTER — Ambulatory Visit: Payer: Medicare Other | Admitting: Cardiovascular Disease

## 2018-10-20 ENCOUNTER — Encounter: Payer: Self-pay | Admitting: Cardiovascular Disease

## 2018-10-20 ENCOUNTER — Ambulatory Visit (INDEPENDENT_AMBULATORY_CARE_PROVIDER_SITE_OTHER): Payer: Medicare Other | Admitting: Cardiovascular Disease

## 2018-10-20 VITALS — BP 122/84 | HR 69 | Ht 67.0 in | Wt 173.8 lb

## 2018-10-20 DIAGNOSIS — R0789 Other chest pain: Secondary | ICD-10-CM

## 2018-10-20 DIAGNOSIS — I4819 Other persistent atrial fibrillation: Secondary | ICD-10-CM

## 2018-10-20 DIAGNOSIS — I10 Essential (primary) hypertension: Secondary | ICD-10-CM | POA: Diagnosis not present

## 2018-10-20 MED ORDER — NEBIVOLOL HCL 5 MG PO TABS: 5.0000 mg | ORAL_TABLET | Freq: Every day | ORAL | 11 refills | Status: DC

## 2018-10-20 MED ORDER — DILTIAZEM HCL ER COATED BEADS 120 MG PO CP24: 120.0000 mg | ORAL_CAPSULE | Freq: Every day | ORAL | 11 refills | Status: DC

## 2018-10-20 MED ORDER — RIVAROXABAN 15 MG PO TABS: 15.0000 mg | ORAL_TABLET | Freq: Every day | ORAL | 11 refills | Status: DC

## 2018-10-20 NOTE — Patient Instructions (Addendum)
Your physician recommends that you continue on your current medications as directed. Please refer to the Current Medication list given to you today.   Your physician has requested that you have an echocardiogram. Echocardiography is a painless test that uses sound waves to create images of your heart. It provides your doctor with information about the size and shape of your heart and how well your heart's chambers and valves are working. This procedure takes approximately one hour. There are no restrictions for this procedure.    Your physician recommends that you schedule a follow-up appointment in:  Derek Schneider

## 2018-10-20 NOTE — Progress Notes (Signed)
Cardiology Office Note:    Date:  10/20/2018   ID:  Derek Schneider, DOB [birth year], MRN [id number]  PCP:  Hayden Rasmussen, MD   Cardiologist:  Marlow Berenguer  Electrophysiologist:  None   Referring MD: Hayden Rasmussen, MD   Chief Complaint  Patient presents with  . Atrial Fibrillation     Jan. 8, 2020    Derek Schneider is a 85 y.o. male who we are asked to see today for further evaluation of his atrial fibrillation at the request of Dr. Darron Doom.  Very healthy 85 yo man. Has been seen Dr. Wynonia Lawman in the past.   Walks 3-4 miles a day.   No CP or dyspnea, Also has hx of hyperlipidemia , HTN, esophageal spasm   Is a retired Clinical research associate Art gallery manager )   He is basically asymptomatic from an A. fib standpoint.  He cannot feel that his heartbeat is beating irregularly.  He can tell if he puts his hand on his pulse but otherwise cannot.  He remains very active.  Past Medical History:  Diagnosis Date  . Acoustic neuroma (HCC)    Right ear  . Asthma   . Bronchitis, not specified as acute or chronic   . Diverticulosis of colon (without mention of hemorrhage)   . Hiatal hernia   . Internal hemorrhoids without mention of complication   . Personal history of colonic polyps 01/12/2008   TUBULOVILLOUS ADENOMA  . PVC (premature ventricular contraction)   . Stricture and stenosis of esophagus   . Unspecified essential hypertension   . Unspecified gastritis and gastroduodenitis without mention of hemorrhage     Past Surgical History:  Procedure Laterality Date  . CARDIAC CATHETERIZATION    . KNEE SURGERY  as a child   left patella removal as a child  . TESTICLAR CYST EXCISION    . TONSILLECTOMY      Current Medications: Current Meds  Medication Sig  . acetaminophen (TYLENOL) 500 MG tablet Take 500 mg by mouth as needed.  Marland Kitchen albuterol (PROVENTIL HFA;VENTOLIN HFA) 108 (90 BASE) MCG/ACT inhaler Inhale 1 puff into the lungs every 6 (six) hours as needed for wheezing or shortness of  breath.  . cholecalciferol (VITAMIN D) 1000 UNITS tablet Take 1,000 Units by mouth daily.  Marland Kitchen dexlansoprazole (DEXILANT) 60 MG capsule Take 1 capsule (60 mg total) by mouth daily before breakfast.  . diltiazem (CARDIZEM CD) 120 MG 24 hr capsule Take 1 capsule (120 mg total) by mouth daily.  . finasteride (PROSCAR) 5 MG tablet Take 30 mg by mouth daily.   . fluticasone (VERAMYST) 27.5 MCG/SPRAY nasal spray Place 2 sprays into the nose as needed for rhinitis.  Marland Kitchen losartan-hydrochlorothiazide (HYZAAR) 100-25 MG per tablet Take 1 tablet by mouth daily.   . nebivolol (BYSTOLIC) 5 MG tablet Take 1 tablet (5 mg total) by mouth daily.  Marland Kitchen NIFEdipine (PROCARDIA XL/ADALAT-CC) 30 MG 24 hr tablet Take 30 mg by mouth daily.  . pantoprazole (PROTONIX) 40 MG tablet Take 40 mg by mouth daily.  . Rivaroxaban (XARELTO) 15 MG TABS tablet Take 1 tablet (15 mg total) by mouth daily with supper.  . Tamsulosin HCl (FLOMAX) 0.4 MG CAPS Take 0.4 mg by mouth daily.  Marland Kitchen terazosin (HYTRIN) 1 MG capsule Take 1 mg by mouth at bedtime.  . triamcinolone (KENALOG) 0.025 % cream Apply 1 application topically as needed.  . [DISCONTINUED] diltiazem (CARDIZEM CD) 120 MG 24 hr capsule Take 120 mg by mouth daily.  . [  DISCONTINUED] nebivolol (BYSTOLIC) 5 MG tablet Take 5 mg by mouth daily.  . [DISCONTINUED] Rivaroxaban (XARELTO) 15 MG TABS tablet Take 15 mg by mouth daily with supper.     Allergies:   Patient has no known allergies.   Social History   Socioeconomic History  . Marital status: Married    Spouse name: Not on file  . Number of children: 5  . Years of education: Not on file  . Highest education level: Not on file  Occupational History  . Occupation: Self employed    Employer: RETIRED  Social Needs  . Financial resource strain: Not on file  . Food insecurity:    Worry: Not on file    Inability: Not on file  . Transportation needs:    Medical: Not on file    Non-medical: Not on file  Tobacco Use  . Smoking  status: Never Smoker  . Smokeless tobacco: Never Used  Substance and Sexual Activity  . Alcohol use: No  . Drug use: No  . Sexual activity: Not on file  Lifestyle  . Physical activity:    Days per week: Not on file    Minutes per session: Not on file  . Stress: Not on file  Relationships  . Social connections:    Talks on phone: Not on file    Gets together: Not on file    Attends religious service: Not on file    Active member of club or organization: Not on file    Attends meetings of clubs or organizations: Not on file    Relationship status: Not on file  Other Topics Concern  . Not on file  Social History Narrative  . Not on file     Family History: The patient's family history includes Cancer in his brother; Heart disease in his father; Kidney disease in his father; Lung cancer in his brother.  ROS:   Please see the history of present illness.     All other systems reviewed and are negative.  EKGs/Labs/Other Studies Reviewed:    The following studies were reviewed today:     Recent Labs: No results found for requested labs within last 8760 hours.  Recent Lipid Panel No results found for: CHOL, TRIG, HDL, CHOLHDL, VLDL, LDLCALC, LDLDIRECT  Physical Exam:    VS:  BP 122/84   Pulse 69   Ht 5\' 7"  (1.702 m)   Wt 173 lb 12.8 oz (78.8 kg)   SpO2 98%   BMI 27.22 kg/m     Wt Readings from Last 3 Encounters:  10/20/18 173 lb 12.8 oz (78.8 kg)  03/02/18 173 lb 6 oz (78.6 kg)  04/20/17 177 lb (80.3 kg)     GEN: Early gentleman, appears to be younger than his stated age. HEENT: Normal NECK: No JVD; No carotid bruits LYMPHATICS: No lymphadenopathy CARDIAC:   Irregularly irregular  RESPIRATORY:  Clear to auscultation without rales, wheezing or rhonchi  ABDOMEN: Soft, non-tender, non-distended MUSCULOSKELETAL:  No edema; No deformity  SKIN: Warm and dry NEUROLOGIC:  Alert and oriented x 3 PSYCHIATRIC:  Normal affect   EKG:   Jan. 8, 2020: Atrial  fibrillation with a ventricular response of 65 beats a minute.  ASSESSMENT:    1. Chest pain, atypical   2. Essential hypertension   3. Persistent atrial fibrillation    PLAN:    In order of problems listed above:  1. Persistent atrial fibrillation:    He is presents for further evaluation of his persistent  atrial fibrillation.  His rate is very well controlled.  He is on the appropriate dose of Xarelto. At this point I think that he is doing very well.  He is basically asymptomatic.  I would like to do an echocardiogram for further evaluation of his LV function and to check his chamber size.  Do not anticipate trying to cardiovert him.  I think that our strategy will remain rate control and anticoagulation.  Explained the strategy to him.  Continue the same medications.  2.  Hypertension: His blood pressure seems to be well controlled.  He states that it goes up a little higher at night.  That overall his readings are quite good.  3.  Chronic kidney disease: The patient has a creatinine of 1.49.  This calculates to a creatinine clearance of 37 mL/min.   Medication Adjustments/Labs and Tests Ordered: Current medicines are reviewed at length with the patient today.  Concerns regarding medicines are outlined above.  Orders Placed This Encounter  Procedures  . EKG 12-Lead  . ECHOCARDIOGRAM COMPLETE   Meds ordered this encounter  Medications  . Rivaroxaban (XARELTO) 15 MG TABS tablet    Sig: Take 1 tablet (15 mg total) by mouth daily with supper.    Dispense:  30 tablet    Refill:  11  . nebivolol (BYSTOLIC) 5 MG tablet    Sig: Take 1 tablet (5 mg total) by mouth daily.    Dispense:  30 tablet    Refill:  11  . diltiazem (CARDIZEM CD) 120 MG 24 hr capsule    Sig: Take 1 capsule (120 mg total) by mouth daily.    Dispense:  30 capsule    Refill:  11    Patient Instructions  Your physician recommends that you continue on your current medications as directed. Please refer to the  Current Medication list given to you today.   Your physician has requested that you have an echocardiogram. Echocardiography is a painless test that uses sound waves to create images of your heart. It provides your doctor with information about the size and shape of your heart and how well your heart's chambers and valves are working. This procedure takes approximately one hour. There are no restrictions for this procedure.    Your physician recommends that you schedule a follow-up appointment in:  [place], Mertie Moores, MD  10/20/2018 2:09 PM    [place]

## 2018-10-21 DIAGNOSIS — I10 Essential (primary) hypertension: Secondary | ICD-10-CM | POA: Diagnosis not present

## 2018-10-21 DIAGNOSIS — K224 Dyskinesia of esophagus: Secondary | ICD-10-CM | POA: Diagnosis not present

## 2018-10-21 DIAGNOSIS — N183 Chronic kidney disease, stage 3 (moderate): Secondary | ICD-10-CM | POA: Diagnosis not present

## 2018-10-21 DIAGNOSIS — E785 Hyperlipidemia, unspecified: Secondary | ICD-10-CM | POA: Diagnosis not present

## 2018-10-25 ENCOUNTER — Other Ambulatory Visit: Payer: Self-pay

## 2018-10-25 ENCOUNTER — Ambulatory Visit (HOSPITAL_COMMUNITY): Payer: Medicare Other | Attending: Cardiovascular Disease

## 2018-10-25 DIAGNOSIS — I4819 Other persistent atrial fibrillation: Secondary | ICD-10-CM | POA: Diagnosis not present

## 2018-11-03 ENCOUNTER — Telehealth: Payer: Self-pay | Admitting: Cardiovascular Disease

## 2018-11-03 NOTE — Telephone Encounter (Signed)
   Primary Cardiologist: Mertie Moores, MD  Chart reviewed as part of pre-operative protocol coverage. Simple dental extractions, cleanings, and implantations are considered low risk procedures per guidelines and generally do not require any specific cardiac clearance. It is also generally accepted that for simple extractions, implantations, and dental cleanings, there is no need to interrupt blood thinner therapy.   SBE prophylaxis is not required for the patient.  I will route this recommendation to the requesting party via Epic fax function and remove from pre-op pool.  Please call with questions.  Abigail Butts, PA-C 11/03/2018, 2:27 PM

## 2018-11-03 NOTE — Telephone Encounter (Signed)
New message        [facility] Pre-operative Risk Assessment    Request for surgical clearance:  1. What type of surgery is being performed? Crown with a post   2. When is this surgery scheduled? 11/03/18  3. What type of clearance is required (medical clearance vs. Pharmacy clearance to hold med vs. Both)?medical . Pt has afib and wants to make sure procedure is ok   4. Are there any medications that need to be held prior to surgery and how long? No surgical     5. Practice name and name of physician performing surgery? Dr[facility]   6. What is your office phone number [phone] 7.   What is your office fax number [phone]  8.   Anesthesia type (None, local, MAC, general) ? local   Francoise Schaumann 11/03/2018, 8:36 AM  _________________________________________________________________   (provider comments below)

## 2018-11-19 DIAGNOSIS — J45909 Unspecified asthma, uncomplicated: Secondary | ICD-10-CM | POA: Diagnosis not present

## 2018-11-19 DIAGNOSIS — I4891 Unspecified atrial fibrillation: Secondary | ICD-10-CM | POA: Diagnosis not present

## 2018-11-19 DIAGNOSIS — B49 Unspecified mycosis: Secondary | ICD-10-CM | POA: Diagnosis not present

## 2018-11-19 DIAGNOSIS — I10 Essential (primary) hypertension: Secondary | ICD-10-CM | POA: Diagnosis not present

## 2018-11-19 DIAGNOSIS — B349 Viral infection, unspecified: Secondary | ICD-10-CM | POA: Diagnosis not present

## 2018-12-03 DIAGNOSIS — I10 Essential (primary) hypertension: Secondary | ICD-10-CM | POA: Diagnosis not present

## 2018-12-03 DIAGNOSIS — J309 Allergic rhinitis, unspecified: Secondary | ICD-10-CM | POA: Diagnosis not present

## 2018-12-03 DIAGNOSIS — I4891 Unspecified atrial fibrillation: Secondary | ICD-10-CM | POA: Diagnosis not present

## 2018-12-03 DIAGNOSIS — J45909 Unspecified asthma, uncomplicated: Secondary | ICD-10-CM | POA: Diagnosis not present

## 2019-01-03 DIAGNOSIS — R109 Unspecified abdominal pain: Secondary | ICD-10-CM | POA: Diagnosis not present

## 2019-01-03 DIAGNOSIS — E785 Hyperlipidemia, unspecified: Secondary | ICD-10-CM | POA: Diagnosis not present

## 2019-01-03 DIAGNOSIS — F419 Anxiety disorder, unspecified: Secondary | ICD-10-CM | POA: Diagnosis not present

## 2019-01-03 DIAGNOSIS — I10 Essential (primary) hypertension: Secondary | ICD-10-CM | POA: Diagnosis not present

## 2019-01-12 DIAGNOSIS — H8110 Benign paroxysmal vertigo, unspecified ear: Secondary | ICD-10-CM | POA: Diagnosis not present

## 2019-01-12 DIAGNOSIS — H903 Sensorineural hearing loss, bilateral: Secondary | ICD-10-CM | POA: Diagnosis not present

## 2019-01-12 DIAGNOSIS — H9313 Tinnitus, bilateral: Secondary | ICD-10-CM | POA: Diagnosis not present

## 2019-01-12 DIAGNOSIS — H6123 Impacted cerumen, bilateral: Secondary | ICD-10-CM | POA: Diagnosis not present

## 2019-01-12 DIAGNOSIS — H905 Unspecified sensorineural hearing loss: Secondary | ICD-10-CM | POA: Diagnosis not present

## 2019-01-12 DIAGNOSIS — D333 Benign neoplasm of cranial nerves: Secondary | ICD-10-CM | POA: Diagnosis not present

## 2019-02-14 DIAGNOSIS — B349 Viral infection, unspecified: Secondary | ICD-10-CM | POA: Diagnosis not present

## 2019-02-14 DIAGNOSIS — R609 Edema, unspecified: Secondary | ICD-10-CM | POA: Diagnosis not present

## 2019-02-14 DIAGNOSIS — F411 Generalized anxiety disorder: Secondary | ICD-10-CM | POA: Diagnosis not present

## 2019-02-14 DIAGNOSIS — Z23 Encounter for immunization: Secondary | ICD-10-CM | POA: Diagnosis not present

## 2019-02-14 DIAGNOSIS — Z7901 Long term (current) use of anticoagulants: Secondary | ICD-10-CM | POA: Diagnosis not present

## 2019-02-14 DIAGNOSIS — R296 Repeated falls: Secondary | ICD-10-CM | POA: Diagnosis not present

## 2019-02-14 DIAGNOSIS — D1724 Benign lipomatous neoplasm of skin and subcutaneous tissue of left leg: Secondary | ICD-10-CM | POA: Diagnosis not present

## 2019-02-14 DIAGNOSIS — M7989 Other specified soft tissue disorders: Secondary | ICD-10-CM | POA: Diagnosis not present

## 2019-02-17 DIAGNOSIS — M7989 Other specified soft tissue disorders: Secondary | ICD-10-CM | POA: Diagnosis not present

## 2019-02-17 DIAGNOSIS — R296 Repeated falls: Secondary | ICD-10-CM | POA: Diagnosis not present

## 2019-02-17 DIAGNOSIS — Z7901 Long term (current) use of anticoagulants: Secondary | ICD-10-CM | POA: Diagnosis not present

## 2019-02-17 DIAGNOSIS — D1724 Benign lipomatous neoplasm of skin and subcutaneous tissue of left leg: Secondary | ICD-10-CM | POA: Diagnosis not present

## 2019-02-17 DIAGNOSIS — R609 Edema, unspecified: Secondary | ICD-10-CM | POA: Diagnosis not present

## 2019-02-17 DIAGNOSIS — Z23 Encounter for immunization: Secondary | ICD-10-CM | POA: Diagnosis not present

## 2019-02-17 DIAGNOSIS — F411 Generalized anxiety disorder: Secondary | ICD-10-CM | POA: Diagnosis not present

## 2019-03-21 DIAGNOSIS — D485 Neoplasm of uncertain behavior of skin: Secondary | ICD-10-CM | POA: Diagnosis not present

## 2019-03-21 DIAGNOSIS — L0109 Other impetigo: Secondary | ICD-10-CM | POA: Diagnosis not present

## 2019-03-21 DIAGNOSIS — Z85828 Personal history of other malignant neoplasm of skin: Secondary | ICD-10-CM | POA: Diagnosis not present

## 2019-03-21 DIAGNOSIS — C44629 Squamous cell carcinoma of skin of left upper limb, including shoulder: Secondary | ICD-10-CM | POA: Diagnosis not present

## 2019-04-20 DIAGNOSIS — C44629 Squamous cell carcinoma of skin of left upper limb, including shoulder: Secondary | ICD-10-CM | POA: Diagnosis not present

## 2019-04-20 DIAGNOSIS — Z85828 Personal history of other malignant neoplasm of skin: Secondary | ICD-10-CM | POA: Diagnosis not present

## 2019-04-20 DIAGNOSIS — L820 Inflamed seborrheic keratosis: Secondary | ICD-10-CM | POA: Diagnosis not present

## 2019-04-20 DIAGNOSIS — D0472 Carcinoma in situ of skin of left lower limb, including hip: Secondary | ICD-10-CM | POA: Diagnosis not present

## 2019-04-27 DIAGNOSIS — Z85828 Personal history of other malignant neoplasm of skin: Secondary | ICD-10-CM | POA: Diagnosis not present

## 2019-04-27 DIAGNOSIS — D0472 Carcinoma in situ of skin of left lower limb, including hip: Secondary | ICD-10-CM | POA: Diagnosis not present

## 2019-05-02 ENCOUNTER — Telehealth: Payer: Self-pay | Admitting: Cardiovascular Disease

## 2019-05-02 NOTE — Telephone Encounter (Signed)

## 2019-05-03 ENCOUNTER — Encounter: Payer: Self-pay | Admitting: Cardiovascular Disease

## 2019-05-03 ENCOUNTER — Ambulatory Visit (INDEPENDENT_AMBULATORY_CARE_PROVIDER_SITE_OTHER): Payer: Medicare Other | Admitting: Cardiovascular Disease

## 2019-05-03 ENCOUNTER — Other Ambulatory Visit: Payer: Self-pay | Admitting: Gastroenterology

## 2019-05-03 ENCOUNTER — Other Ambulatory Visit: Payer: Self-pay

## 2019-05-03 VITALS — BP 126/68 | HR 59 | Ht 67.0 in | Wt 170.8 lb

## 2019-05-03 DIAGNOSIS — I4819 Other persistent atrial fibrillation: Secondary | ICD-10-CM | POA: Diagnosis not present

## 2019-05-03 DIAGNOSIS — I10 Essential (primary) hypertension: Secondary | ICD-10-CM | POA: Diagnosis not present

## 2019-05-03 DIAGNOSIS — I4811 Longstanding persistent atrial fibrillation: Secondary | ICD-10-CM

## 2019-05-03 DIAGNOSIS — I4891 Unspecified atrial fibrillation: Secondary | ICD-10-CM | POA: Insufficient documentation

## 2019-05-03 LAB — BASIC METABOLIC PANEL
BUN/Creatinine Ratio: 15 (ref 10–24)
BUN: 27 mg/dL (ref 10–36)
CO2: 24 mmol/L (ref 20–29)
Calcium: 8.9 mg/dL (ref 8.6–10.2)
Chloride: 104 mmol/L (ref 96–106)
Creatinine, Ser: 1.81 mg/dL — ABNORMAL HIGH (ref 0.76–1.27)
GFR calc Af Amer: 37 mL/min/{1.73_m2} — ABNORMAL LOW (ref >59)
GFR calc non Af Amer: 32 mL/min/{1.73_m2} — ABNORMAL LOW (ref >59)
Glucose: 120 mg/dL — ABNORMAL HIGH (ref 65–99)
Potassium: 3.8 mmol/L (ref 3.5–5.2)
Sodium: 142 mmol/L (ref 134–144)

## 2019-05-03 LAB — HEPATIC FUNCTION PANEL
ALT: 17 IU/L (ref 0–44)
AST: 27 IU/L (ref 0–40)
Albumin: 3.9 g/dL (ref 3.5–4.6)
Alkaline Phosphatase: 77 IU/L (ref 39–117)
Bilirubin Total: 0.9 mg/dL (ref 0.0–1.2)
Bilirubin, Direct: 0.24 mg/dL (ref 0.00–0.40)
Total Protein: 5.6 g/dL — ABNORMAL LOW (ref 6.0–8.5)

## 2019-05-03 MED ORDER — DILTIAZEM HCL ER COATED BEADS 120 MG PO CP24: 120.0000 mg | ORAL_CAPSULE | Freq: Every day | ORAL | 3 refills | Status: DC

## 2019-05-03 MED ORDER — NEBIVOLOL HCL 5 MG PO TABS: 5.0000 mg | ORAL_TABLET | Freq: Every day | ORAL | 3 refills | Status: DC

## 2019-05-03 MED ORDER — DILTIAZEM HCL 30 MG PO TABS: 30.0000 mg | ORAL_TABLET | Freq: Every evening | ORAL | 6 refills | Status: DC | PRN

## 2019-05-03 MED ORDER — RIVAROXABAN 15 MG PO TABS: 15.0000 mg | ORAL_TABLET | Freq: Every day | ORAL | 3 refills | Status: DC

## 2019-05-03 MED ORDER — LOSARTAN POTASSIUM-HCTZ 100-25 MG PO TABS: 1.0000 | ORAL_TABLET | Freq: Every day | ORAL | 3 refills | Status: DC

## 2019-05-03 NOTE — Patient Instructions (Addendum)
Medication Instructions:  Your physician has recommended you make the following change in your medication:  TAKE Diltiazem (Cardizem) 30 mg as needed at bedtime for fast heart rate/a fib  If you need a refill on your cardiac medications before your next appointment, please call your pharmacy.   Lab work: TODAY - Medical laboratory scientific officer, liver panel   Testing/Procedures: None Ordered   Follow-Up: At [facility], you and your health needs are our priority.  As part of our continuing mission to provide you with exceptional heart care, we have created designated Provider Care Teams.  These Care Teams include your primary Cardiologist (physician) and Advanced Practice Providers (APPs -  Physician Assistants and Nurse Practitioners) who all work together to provide you with the care you need, when you need it. You will need a follow up appointment in:  6 months.  Please call our office 2 months in advance to schedule this appointment.  You may see Mertie Moores, MD or one of the following Advanced Practice Providers on your designated Care Team: Richardson Dopp, PA-C [place], [place] . Daune Perch, NP

## 2019-05-03 NOTE — Progress Notes (Signed)
Cardiology Office Note:    Date:  05/03/2019   ID:  Derek Schneider, DOB 1926/04/26, MRN [id number]  PCP:  Derek Rasmussen, MD   Cardiologist:  Derek Schneider  Electrophysiologist:  None   Referring MD: Derek Rasmussen, MD   Chief Complaint  Patient presents with  . Chest Pain  . Atrial Fibrillation     Jan. 8, 2020    Derek Schneider is a 85 y.o. male who we are asked to see today for further evaluation of his atrial fibrillation at the request of Dr. Darron Schneider.  Very healthy 85 yo man. Has been seen Dr. Wynonia Schneider in the past.   Walks 3-4 miles a day.   No CP or dyspnea, Also has hx of hyperlipidemia , HTN, esophageal spasm   Is a retired Clinical research associate Art gallery manager )   He is basically asymptomatic from an A. fib standpoint.  He cannot feel that his heartbeat is beating irregularly.  He can tell if he puts his hand on his pulse but otherwise cannot.  He remains very active.  May 03, 2019   Derek Schneider is very stable.   Walks 4 miles a day .  Tolerates his persistent AF quite well.    Past Medical History:  Diagnosis Date  . Acoustic neuroma (HCC)    Right ear  . Asthma   . Bronchitis, not specified as acute or chronic   . Diverticulosis of colon (without mention of hemorrhage)   . Hiatal hernia   . Internal hemorrhoids without mention of complication   . Personal history of colonic polyps 01/12/2008   TUBULOVILLOUS ADENOMA  . PVC (premature ventricular contraction)   . Stricture and stenosis of esophagus   . Unspecified essential hypertension   . Unspecified gastritis and gastroduodenitis without mention of hemorrhage     Past Surgical History:  Procedure Laterality Date  . CARDIAC CATHETERIZATION    . KNEE SURGERY  as a child   left patella removal as a child  . TESTICLAR CYST EXCISION    . TONSILLECTOMY      Current Medications: Current Meds  Medication Sig  . acetaminophen (TYLENOL) 500 MG tablet Take 500 mg by mouth as needed.  Marland Kitchen albuterol (PROVENTIL HFA;VENTOLIN  HFA) 108 (90 BASE) MCG/ACT inhaler Inhale 1 puff into the lungs every 6 (six) hours as needed for wheezing or shortness of breath.  . cholecalciferol (VITAMIN D) 1000 UNITS tablet Take 1,000 Units by mouth daily.  Marland Kitchen dexlansoprazole (DEXILANT) 60 MG capsule Take 1 capsule (60 mg total) by mouth daily before breakfast.  . diltiazem (CARDIZEM CD) 120 MG 24 hr capsule Take 1 capsule (120 mg total) by mouth daily.  . finasteride (PROSCAR) 5 MG tablet Take 30 mg by mouth daily.   . fluticasone (VERAMYST) 27.5 MCG/SPRAY nasal spray Place 2 sprays into the nose as needed for rhinitis.  Marland Kitchen losartan-hydrochlorothiazide (HYZAAR) 100-25 MG tablet Take 1 tablet by mouth daily.  . nebivolol (BYSTOLIC) 5 MG tablet Take 1 tablet (5 mg total) by mouth daily.  . Rivaroxaban (XARELTO) 15 MG TABS tablet Take 1 tablet (15 mg total) by mouth daily with supper.  . Tamsulosin HCl (FLOMAX) 0.4 MG CAPS Take 0.4 mg by mouth daily.  Marland Kitchen terazosin (HYTRIN) 1 MG capsule Take 1 mg by mouth at bedtime.  . triamcinolone (KENALOG) 0.025 % cream Apply 1 application topically as needed.  . [DISCONTINUED] diltiazem (CARDIZEM CD) 120 MG 24 hr capsule Take 1 capsule (120 mg total) by mouth  daily.  . [DISCONTINUED] losartan-hydrochlorothiazide (HYZAAR) 100-25 MG per tablet Take 1 tablet by mouth daily.   . [DISCONTINUED] nebivolol (BYSTOLIC) 5 MG tablet Take 1 tablet (5 mg total) by mouth daily.  . [DISCONTINUED] Rivaroxaban (XARELTO) 15 MG TABS tablet Take 1 tablet (15 mg total) by mouth daily with supper.     Allergies:   Patient has no known allergies.   Social History   Socioeconomic History  . Marital status: Married    Spouse name: Not on file  . Number of children: 5  . Years of education: Not on file  . Highest education level: Not on file  Occupational History  . Occupation: Self employed    Employer: RETIRED  Social Needs  . Financial resource strain: Not on file  . Food insecurity    Worry: Not on file     Inability: Not on file  . Transportation needs    Medical: Not on file    Non-medical: Not on file  Tobacco Use  . Smoking status: Never Smoker  . Smokeless tobacco: Never Used  Substance and Sexual Activity  . Alcohol use: No  . Drug use: No  . Sexual activity: Not on file  Lifestyle  . Physical activity    Days per week: Not on file    Minutes per session: Not on file  . Stress: Not on file  Relationships  . Social Herbalist on phone: Not on file    Gets together: Not on file    Attends religious service: Not on file    Active member of club or organization: Not on file    Attends meetings of clubs or organizations: Not on file    Relationship status: Not on file  Other Topics Concern  . Not on file  Social History Narrative  . Not on file     Family History: The patient's family history includes Cancer in his brother; Heart disease in his father; Kidney disease in his father; Lung cancer in his brother.  ROS:   Please see the history of present illness.     All other systems reviewed and are negative.  EKGs/Labs/Other Studies Reviewed:    The following studies were reviewed today:     Recent Labs: 05/03/2019: ALT 17; BUN 27; Creatinine, Ser 1.81; Potassium 3.8; Sodium 142  Recent Lipid Panel No results found for: CHOL, TRIG, HDL, CHOLHDL, VLDL, LDLCALC, LDLDIRECT  Physical Exam:    Physical Exam: Blood pressure 126/68, pulse (!) 59, height 5\' 7"  (1.702 m), weight 170 lb 12.8 oz (77.5 kg), SpO2 97 %.  GEN:  Well nourished, well developed in no acute distress HEENT: Normal NECK: No JVD; No carotid bruits LYMPHATICS: No lymphadenopathy CARDIAC:  Irreg. Irreg.  no murmurs, rubs, gallops RESPIRATORY:  Clear to auscultation without rales, wheezing or rhonchi  ABDOMEN: Soft, non-tender, non-distended MUSCULOSKELETAL:  No edema; No deformity  SKIN: Warm and dry NEUROLOGIC:  Alert and oriented x 3    EKG:     ASSESSMENT:    1. Essential  hypertension   2. Persistent atrial fibrillation    PLAN:    In order of problems listed above:  1. Persistent atrial fibrillation:    He is presents for further evaluation of his persistent atrial fibrillation.  His rate is very well controlled.  He is on the appropriate dose of Xarelto.   2.  Hypertension:  BP is well controlled.   3.  Chronic kidney disease:  Medication Adjustments/Labs and Tests Ordered: Current medicines are reviewed at length with the patient today.  Concerns regarding medicines are outlined above.  Orders Placed This Encounter  Procedures  . Basic Metabolic Panel (BMET)  . Hepatic function panel   Meds ordered this encounter  Medications  . Rivaroxaban (XARELTO) 15 MG TABS tablet    Sig: Take 1 tablet (15 mg total) by mouth daily with supper.    Dispense:  90 tablet    Refill:  3  . nebivolol (BYSTOLIC) 5 MG tablet    Sig: Take 1 tablet (5 mg total) by mouth daily.    Dispense:  90 tablet    Refill:  3  . losartan-hydrochlorothiazide (HYZAAR) 100-25 MG tablet    Sig: Take 1 tablet by mouth daily.    Dispense:  90 tablet    Refill:  3  . diltiazem (CARDIZEM CD) 120 MG 24 hr capsule    Sig: Take 1 capsule (120 mg total) by mouth daily.    Dispense:  90 capsule    Refill:  3  . diltiazem (CARDIZEM) 30 MG tablet    Sig: Take 1 tablet (30 mg total) by mouth at bedtime as needed (fast heart rate/atrial fib).    Dispense:  30 tablet    Refill:  6    PRN in addition to Diltiazem 120 mg    Patient Instructions  Medication Instructions:  Your physician has recommended you make the following change in your medication:  TAKE Diltiazem (Cardizem) 30 mg as needed at bedtime for fast heart rate/a fib  If you need a refill on your cardiac medications before your next appointment, please call your pharmacy.   Lab work: TODAY - Medical laboratory scientific officer, liver panel   Testing/Procedures: None Ordered   Follow-Up: At [facility], you and your  health needs are our priority.  As part of our continuing mission to provide you with exceptional heart care, we have created designated Provider Care Teams.  These Care Teams include your primary Cardiologist (physician) and Advanced Practice Providers (APPs -  Physician Assistants and Nurse Practitioners) who all work together to provide you with the care you need, when you need it. You will need a follow up appointment in:  6 months.  Please call our office 2 months in advance to schedule this appointment.  You may see Mertie Moores, MD or one of the following Advanced Practice Providers on your designated Care Team: Richardson Dopp, PA-C [place], [place] . Daune Perch, NP       Signed, Mertie Moores, MD  05/03/2019 5:40 PM    [place]

## 2019-05-13 DIAGNOSIS — I10 Essential (primary) hypertension: Secondary | ICD-10-CM | POA: Diagnosis not present

## 2019-05-13 DIAGNOSIS — N183 Chronic kidney disease, stage 3 (moderate): Secondary | ICD-10-CM | POA: Diagnosis not present

## 2019-05-13 DIAGNOSIS — R7309 Other abnormal glucose: Secondary | ICD-10-CM | POA: Diagnosis not present

## 2019-05-13 DIAGNOSIS — N182 Chronic kidney disease, stage 2 (mild): Secondary | ICD-10-CM | POA: Diagnosis not present

## 2019-05-13 DIAGNOSIS — R42 Dizziness and giddiness: Secondary | ICD-10-CM | POA: Diagnosis not present

## 2019-05-13 DIAGNOSIS — B349 Viral infection, unspecified: Secondary | ICD-10-CM | POA: Diagnosis not present

## 2019-05-19 DIAGNOSIS — B349 Viral infection, unspecified: Secondary | ICD-10-CM | POA: Diagnosis not present

## 2019-05-19 DIAGNOSIS — N182 Chronic kidney disease, stage 2 (mild): Secondary | ICD-10-CM | POA: Diagnosis not present

## 2019-05-19 DIAGNOSIS — R296 Repeated falls: Secondary | ICD-10-CM | POA: Diagnosis not present

## 2019-05-19 DIAGNOSIS — R42 Dizziness and giddiness: Secondary | ICD-10-CM | POA: Diagnosis not present

## 2019-05-27 DIAGNOSIS — E8770 Fluid overload, unspecified: Secondary | ICD-10-CM | POA: Diagnosis not present

## 2019-05-27 DIAGNOSIS — B349 Viral infection, unspecified: Secondary | ICD-10-CM | POA: Diagnosis not present

## 2019-05-27 DIAGNOSIS — R296 Repeated falls: Secondary | ICD-10-CM | POA: Diagnosis not present

## 2019-05-27 DIAGNOSIS — M7989 Other specified soft tissue disorders: Secondary | ICD-10-CM | POA: Diagnosis not present

## 2019-06-02 DIAGNOSIS — R609 Edema, unspecified: Secondary | ICD-10-CM | POA: Diagnosis not present

## 2019-06-02 DIAGNOSIS — R296 Repeated falls: Secondary | ICD-10-CM | POA: Diagnosis not present

## 2019-06-02 DIAGNOSIS — B349 Viral infection, unspecified: Secondary | ICD-10-CM | POA: Diagnosis not present

## 2019-06-02 DIAGNOSIS — M7989 Other specified soft tissue disorders: Secondary | ICD-10-CM | POA: Diagnosis not present

## 2019-06-02 DIAGNOSIS — F411 Generalized anxiety disorder: Secondary | ICD-10-CM | POA: Diagnosis not present

## 2019-06-02 DIAGNOSIS — D1724 Benign lipomatous neoplasm of skin and subcutaneous tissue of left leg: Secondary | ICD-10-CM | POA: Diagnosis not present

## 2019-06-02 DIAGNOSIS — Z23 Encounter for immunization: Secondary | ICD-10-CM | POA: Diagnosis not present

## 2019-06-02 DIAGNOSIS — Z7901 Long term (current) use of anticoagulants: Secondary | ICD-10-CM | POA: Diagnosis not present

## 2019-06-02 DIAGNOSIS — I509 Heart failure, unspecified: Secondary | ICD-10-CM | POA: Diagnosis not present

## 2019-06-14 ENCOUNTER — Ambulatory Visit: Payer: Medicare Other | Admitting: Cardiovascular Disease

## 2019-07-12 DIAGNOSIS — Z7901 Long term (current) use of anticoagulants: Secondary | ICD-10-CM | POA: Diagnosis not present

## 2019-07-12 DIAGNOSIS — R296 Repeated falls: Secondary | ICD-10-CM | POA: Diagnosis not present

## 2019-07-12 DIAGNOSIS — F411 Generalized anxiety disorder: Secondary | ICD-10-CM | POA: Diagnosis not present

## 2019-07-12 DIAGNOSIS — Z23 Encounter for immunization: Secondary | ICD-10-CM | POA: Diagnosis not present

## 2019-07-12 DIAGNOSIS — M7989 Other specified soft tissue disorders: Secondary | ICD-10-CM | POA: Diagnosis not present

## 2019-07-12 DIAGNOSIS — D1724 Benign lipomatous neoplasm of skin and subcutaneous tissue of left leg: Secondary | ICD-10-CM | POA: Diagnosis not present

## 2019-07-12 DIAGNOSIS — R609 Edema, unspecified: Secondary | ICD-10-CM | POA: Diagnosis not present

## 2019-07-13 DIAGNOSIS — H0102B Squamous blepharitis left eye, upper and lower eyelids: Secondary | ICD-10-CM | POA: Diagnosis not present

## 2019-07-13 DIAGNOSIS — H43811 Vitreous degeneration, right eye: Secondary | ICD-10-CM | POA: Diagnosis not present

## 2019-07-13 DIAGNOSIS — D3131 Benign neoplasm of right choroid: Secondary | ICD-10-CM | POA: Diagnosis not present

## 2019-07-13 DIAGNOSIS — H31092 Other chorioretinal scars, left eye: Secondary | ICD-10-CM | POA: Diagnosis not present

## 2019-07-13 DIAGNOSIS — H0102A Squamous blepharitis right eye, upper and lower eyelids: Secondary | ICD-10-CM | POA: Diagnosis not present

## 2019-07-13 DIAGNOSIS — H2513 Age-related nuclear cataract, bilateral: Secondary | ICD-10-CM | POA: Diagnosis not present

## 2019-08-11 DIAGNOSIS — I10 Essential (primary) hypertension: Secondary | ICD-10-CM | POA: Diagnosis not present

## 2019-08-11 DIAGNOSIS — E785 Hyperlipidemia, unspecified: Secondary | ICD-10-CM | POA: Diagnosis not present

## 2019-08-11 DIAGNOSIS — R7309 Other abnormal glucose: Secondary | ICD-10-CM | POA: Diagnosis not present

## 2019-08-11 DIAGNOSIS — E8770 Fluid overload, unspecified: Secondary | ICD-10-CM | POA: Diagnosis not present

## 2019-08-11 DIAGNOSIS — R296 Repeated falls: Secondary | ICD-10-CM | POA: Diagnosis not present

## 2019-08-11 DIAGNOSIS — I509 Heart failure, unspecified: Secondary | ICD-10-CM | POA: Diagnosis not present

## 2019-08-11 DIAGNOSIS — I4891 Unspecified atrial fibrillation: Secondary | ICD-10-CM | POA: Diagnosis not present

## 2019-08-15 DIAGNOSIS — K219 Gastro-esophageal reflux disease without esophagitis: Secondary | ICD-10-CM | POA: Diagnosis not present

## 2019-08-15 DIAGNOSIS — I4891 Unspecified atrial fibrillation: Secondary | ICD-10-CM | POA: Diagnosis not present

## 2019-08-15 DIAGNOSIS — E785 Hyperlipidemia, unspecified: Secondary | ICD-10-CM | POA: Diagnosis not present

## 2019-08-15 DIAGNOSIS — I10 Essential (primary) hypertension: Secondary | ICD-10-CM | POA: Diagnosis not present

## 2019-09-02 DIAGNOSIS — D333 Benign neoplasm of cranial nerves: Secondary | ICD-10-CM | POA: Diagnosis not present

## 2019-09-02 DIAGNOSIS — H903 Sensorineural hearing loss, bilateral: Secondary | ICD-10-CM | POA: Diagnosis not present

## 2019-09-02 DIAGNOSIS — L989 Disorder of the skin and subcutaneous tissue, unspecified: Secondary | ICD-10-CM | POA: Diagnosis not present

## 2019-09-02 DIAGNOSIS — H938X3 Other specified disorders of ear, bilateral: Secondary | ICD-10-CM | POA: Diagnosis not present

## 2019-09-02 DIAGNOSIS — Z01118 Encounter for examination of ears and hearing with other abnormal findings: Secondary | ICD-10-CM | POA: Diagnosis not present

## 2019-09-02 DIAGNOSIS — D492 Neoplasm of unspecified behavior of bone, soft tissue, and skin: Secondary | ICD-10-CM | POA: Diagnosis not present

## 2019-09-02 DIAGNOSIS — Z974 Presence of external hearing-aid: Secondary | ICD-10-CM | POA: Diagnosis not present

## 2019-09-05 ENCOUNTER — Other Ambulatory Visit: Payer: Self-pay | Admitting: Gastroenterology

## 2019-09-05 DIAGNOSIS — J45998 Other asthma: Secondary | ICD-10-CM | POA: Diagnosis not present

## 2019-09-05 DIAGNOSIS — R1310 Dysphagia, unspecified: Secondary | ICD-10-CM | POA: Diagnosis not present

## 2019-09-12 ENCOUNTER — Telehealth: Payer: Self-pay | Admitting: *Deleted

## 2019-09-12 NOTE — Telephone Encounter (Signed)
Patient with diagnosis of afib on Xarelto for anticoagulation.    Procedure:  EGD  Date of procedure: 09/16/2019  CHADS2-VASc score of  3 (HTN, AGE,  AGE)  CrCl 29 ml/min  Per office protocol, patient can hold Xarelto for 2 days prior to procedure.

## 2019-09-12 NOTE — Telephone Encounter (Signed)
   [facility] Pre-operative Risk Assessment    Request for surgical clearance:  1. What type of surgery is being performed? EGD   2. When is this surgery scheduled? 09/16/19   3. What type of clearance is required (medical clearance vs. Pharmacy clearance to hold med vs. Both)? BOTH  4. Are there any medications that need to be held prior to surgery and how long? Kelly   5. Practice name and name of physician performing surgery? [facility]; DR. HUNG   6. What is your office phone number [phone]    7.   What is your office fax number [phone]  8.   Anesthesia type (None, local, MAC, general) ? PROPOFOL   Julaine Hua 09/12/2019, 1:10 PM  _________________________________________________________________   (provider comments below)

## 2019-09-12 NOTE — Telephone Encounter (Signed)
   Primary Cardiologist: Mertie Moores, MD  Chart reviewed as part of pre-operative protocol coverage. Patient was contacted 09/12/2019 in reference to pre-operative risk assessment for pending surgery as outlined below.  Derek Schneider was last seen on 05/03/2019 by Dr. Acie Fredrickson.  Since that day, Derek Schneider has done well. He has rate controlled afib. He also has chronic mild DOE, unchanged recently. He is able to walk 3-4 miles per day with no trouble.   Therefore, based on ACC/AHA guidelines, the patient would be at acceptable risk for the planned procedure without further cardiovascular testing.   Per office protocol, patient can hold Xarelto for 2 days prior to procedure.    I will route this recommendation to the requesting party via Epic fax function and remove from pre-op pool.  Please call with questions.  Daune Perch, NP 09/12/2019, 2:43 PM

## 2019-09-12 NOTE — Telephone Encounter (Signed)
I attempted to call pt to update status and make sure no changes since last office visit in July. He is not home. His wife says that he should return in about 30 minutes. I will call back then.

## 2019-09-13 ENCOUNTER — Other Ambulatory Visit: Payer: Self-pay | Admitting: Otolaryngology

## 2019-09-13 ENCOUNTER — Other Ambulatory Visit (HOSPITAL_COMMUNITY)
Admission: RE | Admit: 2019-09-13 | Discharge: 2019-09-13 | Disposition: A | Discharge status: Home / Self Care | Payer: Medicare Other | Source: Ambulatory Visit | Attending: Gastroenterology | Admitting: Gastroenterology

## 2019-09-13 DIAGNOSIS — D333 Benign neoplasm of cranial nerves: Secondary | ICD-10-CM

## 2019-09-13 DIAGNOSIS — Z01812 Encounter for preprocedural laboratory examination: Secondary | ICD-10-CM | POA: Diagnosis not present

## 2019-09-13 DIAGNOSIS — H903 Sensorineural hearing loss, bilateral: Secondary | ICD-10-CM

## 2019-09-13 DIAGNOSIS — Z20828 Contact with and (suspected) exposure to other viral communicable diseases: Secondary | ICD-10-CM | POA: Diagnosis not present

## 2019-09-14 LAB — NOVEL CORONAVIRUS, NAA (HOSP ORDER, SEND-OUT TO REF LAB; TAT 18-24 HRS): SARS-CoV-2, NAA: NOT DETECTED

## 2019-09-15 ENCOUNTER — Encounter (HOSPITAL_COMMUNITY): Payer: Self-pay

## 2019-09-15 ENCOUNTER — Other Ambulatory Visit: Payer: Self-pay

## 2019-09-16 ENCOUNTER — Encounter (HOSPITAL_COMMUNITY): Payer: Self-pay | Admitting: *Deleted

## 2019-09-16 ENCOUNTER — Ambulatory Visit (HOSPITAL_COMMUNITY)
Admission: RE | Admit: 2019-09-16 | Discharge: 2019-09-16 | Disposition: A | Discharge status: Home / Self Care | Payer: Medicare Other | Attending: Gastroenterology | Admitting: Gastroenterology

## 2019-09-16 ENCOUNTER — Ambulatory Visit (HOSPITAL_COMMUNITY): Payer: Medicare Other | Admitting: Anesthesiology

## 2019-09-16 ENCOUNTER — Encounter (HOSPITAL_COMMUNITY)
Admission: RE | Disposition: A | Discharge status: Home / Self Care | Payer: Self-pay | Source: Home / Self Care | Attending: Gastroenterology

## 2019-09-16 ENCOUNTER — Other Ambulatory Visit: Payer: Self-pay

## 2019-09-16 DIAGNOSIS — Z79899 Other long term (current) drug therapy: Secondary | ICD-10-CM | POA: Diagnosis not present

## 2019-09-16 DIAGNOSIS — I4891 Unspecified atrial fibrillation: Secondary | ICD-10-CM | POA: Insufficient documentation

## 2019-09-16 DIAGNOSIS — I493 Ventricular premature depolarization: Secondary | ICD-10-CM | POA: Diagnosis not present

## 2019-09-16 DIAGNOSIS — R1310 Dysphagia, unspecified: Secondary | ICD-10-CM | POA: Diagnosis not present

## 2019-09-16 DIAGNOSIS — K449 Diaphragmatic hernia without obstruction or gangrene: Secondary | ICD-10-CM | POA: Insufficient documentation

## 2019-09-16 DIAGNOSIS — Z7901 Long term (current) use of anticoagulants: Secondary | ICD-10-CM | POA: Insufficient documentation

## 2019-09-16 DIAGNOSIS — J45909 Unspecified asthma, uncomplicated: Secondary | ICD-10-CM | POA: Diagnosis not present

## 2019-09-16 DIAGNOSIS — K222 Esophageal obstruction: Secondary | ICD-10-CM | POA: Insufficient documentation

## 2019-09-16 DIAGNOSIS — I10 Essential (primary) hypertension: Secondary | ICD-10-CM | POA: Insufficient documentation

## 2019-09-16 HISTORY — PX: SAVORY DILATION: SHX5439

## 2019-09-16 HISTORY — DX: Unspecified atrial fibrillation: I48.91

## 2019-09-16 HISTORY — DX: Presence of spectacles and contact lenses: Z97.3

## 2019-09-16 HISTORY — PX: ESOPHAGOGASTRODUODENOSCOPY (EGD) WITH PROPOFOL: SHX5813

## 2019-09-16 SURGERY — ESOPHAGOGASTRODUODENOSCOPY (EGD) WITH PROPOFOL
Anesthesia: Monitor Anesthesia Care

## 2019-09-16 MED ORDER — PROPOFOL 500 MG/50ML IV EMUL
INTRAVENOUS | Status: AC
  Filled 2019-09-16: qty 50

## 2019-09-16 MED ORDER — PROPOFOL 500 MG/50ML IV EMUL
INTRAVENOUS | Status: DC | PRN
  Administered 2019-09-16: 100 ug/kg/min via INTRAVENOUS

## 2019-09-16 MED ORDER — LACTATED RINGERS IV SOLN
INTRAVENOUS | Status: DC
  Administered 2019-09-16: 1000 mL via INTRAVENOUS

## 2019-09-16 MED ORDER — PROPOFOL 10 MG/ML IV BOLUS
INTRAVENOUS | Status: DC | PRN
  Administered 2019-09-16 (×2): 10 mg via INTRAVENOUS

## 2019-09-16 MED ORDER — SODIUM CHLORIDE 0.9 % IV SOLN: INTRAVENOUS | Status: DC

## 2019-09-16 MED ORDER — LIDOCAINE HCL 1 % IJ SOLN
INTRAMUSCULAR | Status: DC | PRN
  Administered 2019-09-16: 50 mg via INTRADERMAL

## 2019-09-16 SURGICAL SUPPLY — 15 items

## 2019-09-16 NOTE — Anesthesia Preprocedure Evaluation (Addendum)
Anesthesia Evaluation  Patient identified by MRN, date of birth, ID band Patient awake    Reviewed: Allergy & Precautions, NPO status , Patient's Chart, lab work & pertinent test results, reviewed documented beta blocker date and time   Airway Mallampati: II  TM Distance: >3 FB Neck ROM: Full    Dental  (+) Dental Advisory Given Upper bridge :   Pulmonary asthma ,    Pulmonary exam normal breath sounds clear to auscultation       Cardiovascular hypertension, Pt. on medications and Pt. on home beta blockers + dysrhythmias Atrial Fibrillation + Valvular Problems/Murmurs AI and MR  Rhythm:Irregular Rate:Abnormal  Echo 10/2018: Study Conclusions  - Left ventricle: The cavity size was normal. Wall thickness was increased in a pattern of mild LVH. There was focal basal hypertrophy. Systolic function was normal. The estimated ejection fraction was in the range of 60% to 65%. Wall motion was normal; there were no regional wall motion abnormalities. - Aortic valve: There was mild regurgitation. - Mitral valve: There was moderate regurgitation. - Left atrium: The atrium was mildly to moderately dilated.   Neuro/Psych negative neurological ROS  negative psych ROS   GI/Hepatic Neg liver ROS, hiatal hernia, GERD  Medicated,dysphagia   Endo/Other  negative endocrine ROS  Renal/GU negative Renal ROS     Musculoskeletal negative musculoskeletal ROS (+)   Abdominal   Peds  Hematology  (+) Blood dyscrasia (Xarelto), ,   Anesthesia Other Findings Day of surgery medications reviewed with the patient.  Reproductive/Obstetrics                            Anesthesia Physical Anesthesia Plan  ASA: III  Anesthesia Plan: MAC   Post-op Pain Management:    Induction: Intravenous  PONV Risk Score and Plan: 1 and Propofol infusion and Treatment may vary due to age or medical condition  Airway Management  Planned: Nasal Cannula and Natural Airway  Additional Equipment:   Intra-op Plan:   Post-operative Plan:   Informed Consent: I have reviewed the patients History and Physical, chart, labs and discussed the procedure including the risks, benefits and alternatives for the proposed anesthesia with the patient or authorized representative who has indicated his/her understanding and acceptance.     Dental advisory given  Plan Discussed with: CRNA and Anesthesiologist  Anesthesia Plan Comments:         Anesthesia Quick Evaluation

## 2019-09-16 NOTE — Anesthesia Postprocedure Evaluation (Signed)
Anesthesia Post Note  Patient: Stylianos Stradling Albarran  Procedure(s) Performed: ESOPHAGOGASTRODUODENOSCOPY (EGD) WITH PROPOFOL (N/A ) SAVORY DILATION (N/A )     Patient location during evaluation: PACU Anesthesia Type: MAC Level of consciousness: awake and alert Pain management: pain level controlled Vital Signs Assessment: post-procedure vital signs reviewed and stable Respiratory status: spontaneous breathing, nonlabored ventilation and respiratory function stable Cardiovascular status: stable and blood pressure returned to baseline Postop Assessment: no apparent nausea or vomiting Anesthetic complications: no    Last Vitals:  Vitals:   09/16/19 1030 09/16/19 1040  BP: 138/74 (!) 150/91  Pulse: 61 64  Resp: 17 (!) 25  Temp:    SpO2: 97% 97%    Last Pain:  Vitals:   09/16/19 1019  TempSrc: Oral  PainSc: 0-No pain                 Catalina Gravel

## 2019-09-16 NOTE — Discharge Instructions (Signed)
RESUME XARELTO TOMORROW ON 09/17/2019.   YOU HAD AN ENDOSCOPIC PROCEDURE TODAY: Refer to the procedure report and other information in the discharge instructions given to you for any specific questions about what was found during the examination. If this information does not answer your questions, please call [facility] at [phone] to clarify.   YOU SHOULD EXPECT: Some feelings of bloating in the abdomen. Passage of more gas than usual. Walking can help get rid of the air that was put into your GI tract during the procedure and reduce the bloating. If you had a lower endoscopy (such as a colonoscopy or flexible sigmoidoscopy) you may notice spotting of blood in your stool or on the toilet paper. Some abdominal soreness may be present for a day or two, also.  DIET: Your first meal following the procedure should be a light meal and then it is ok to progress to your normal diet. A half-sandwich or bowl of soup is an example of a good first meal. Heavy or fried foods are harder to digest and may make you feel nauseous or bloated. Drink plenty of fluids but you should avoid alcoholic beverages for 24 hours. If you had an esophageal dilation, please see attached information for diet.   ACTIVITY: Your care partner should take you home directly after the procedure. You should plan to take it easy, moving slowly for the rest of the day. You can resume normal activity the day after the procedure however YOU SHOULD NOT DRIVE, use power tools, machinery or perform tasks that involve climbing or major physical exertion for 24 hours (because of the sedation medicines used during the test).   SYMPTOMS TO REPORT IMMEDIATELY: A gastroenterologist can be reached at any hour. Please call [phone]  for any of the following symptoms:   Following upper endoscopy (EGD, EUS, ERCP, esophageal dilation) Vomiting of blood or coffee ground material  New, significant abdominal pain  New, significant chest pain  or pain under the shoulder blades  Painful or persistently difficult swallowing  New shortness of breath  Black, tarry-looking or red, bloody stools  FOLLOW UP:  If any biopsies were taken you will be contacted by phone or by letter within the next 1-3 weeks. Call [phone]  if you have not heard about the biopsies in 3 weeks.  Please also call with any specific questions about appointments or follow up tests.

## 2019-09-16 NOTE — H&P (Signed)
  Derek Schneider HPI: In the past he was followed by Dr. Velora Schneider with routine EGDs and colonoscopies. He has a history of polyps, but then the procedures were stopped as a result of his age. The EGDs in the past showed that it was "scarred", per his report. The prior results from Epic show that he had a manometry an 24 hour pH testing in 2011 with findings of some mild reflux in the upright position. His DeMeester score was reported to be at 21. The last colonoscopy was on 01/31/2009 and no polyps were identified, but he ahs a history of adenomas. A cecal TVA was removed in 2004. The patient reports that he has some throat closure with his dysphagia symptoms. He will have problems with some solid foods, but no issues with swallowing pills or liquids. A spray was provided for him and he feels that it is helpful to "open" up his throat.   Past Medical History:  Diagnosis Date  . Acoustic neuroma (HCC)    Right ear  . Asthma   . Atrial fibrillation ([place])   . Bronchitis, not specified as acute or chronic   . Diverticulosis of colon (without mention of hemorrhage)   . Hiatal hernia   . Internal hemorrhoids without mention of complication   . Personal history of colonic polyps 01/12/2008   TUBULOVILLOUS ADENOMA  . PVC (premature ventricular contraction)   . Stricture and stenosis of esophagus   . Unspecified essential hypertension   . Unspecified gastritis and gastroduodenitis without mention of hemorrhage   . Wears contact lenses    right eye    Past Surgical History:  Procedure Laterality Date  . CARDIAC CATHETERIZATION    . KNEE SURGERY  as a child   left patella removal as a child  . TESTICLAR CYST EXCISION    . TONSILLECTOMY      Family History  Problem Relation Age of Onset  . Lung cancer Brother   . Heart disease Father   . Kidney disease Father   . Cancer Brother     Social History:  reports that he has never smoked. He has never used smokeless tobacco. He reports  that he does not drink alcohol or use drugs.  Allergies: No Known Allergies  Medications:  Scheduled:  Continuous: . sodium chloride    . lactated ringers 1,000 mL (09/16/19 0931)    No results found for this or any previous visit (from the past 24 hour(s)).   No results found.  ROS:  As stated above in the HPI otherwise negative.  Blood pressure (!) 171/95, pulse 72, temperature 97.9 F (36.6 C), temperature source Oral, resp. rate (!) 22, height 5\' 7"  (1.702 m), weight 78.5 kg, SpO2 100 %.    PE: Gen: NAD, Alert and Oriented HEENT:  [place]/AT, EOMI Neck: Supple, no LAD Lungs: CTA Bilaterally CV: RRR without M/G/R ABM: Soft, NTND, +BS Ext: No C/C/E  Assessment/Plan: 1) Dysphagia  He has a history of asthma and this may be playing a role with his current complaints, however, there is a dysphagia aspect. An EGD with possible dilation will be performed, but he wants to confer with his family members before scheduling his procedure. The procedure will be at the hospital and clearance to hold his Xarelto will be obtained.   Plan: 1) EGD with possible dilation.  Derek Schneider D 09/16/2019, 9:37 AM

## 2019-09-16 NOTE — Transfer of Care (Signed)
Immediate Anesthesia Transfer of Care Note  Patient: Derek Schneider  Procedure(s) Performed: ESOPHAGOGASTRODUODENOSCOPY (EGD) WITH PROPOFOL (N/A ) SAVORY DILATION (N/A )  Patient Location: PACU and Endoscopy Unit  Anesthesia Type:MAC  Level of Consciousness: awake, alert , oriented and patient cooperative  Airway & Oxygen Therapy: Patient Spontanous Breathing and Patient connected to face mask oxygen  Post-op Assessment: Report given to RN and Post -op Vital signs reviewed and stable  Post vital signs: Reviewed and stable  Last Vitals:  Vitals Value Taken Time  BP 135/75 09/16/19 1020  Temp 36.6 C 09/16/19 1019  Pulse 65 09/16/19 1027  Resp 16 09/16/19 1027  SpO2 97 % 09/16/19 1027  Vitals shown include unvalidated device data.  Last Pain:  Vitals:   09/16/19 1019  TempSrc: Oral  PainSc: 0-No pain         Complications: No apparent anesthesia complications

## 2019-09-16 NOTE — Op Note (Signed)
[facility] Patient Name: Derek Schneider Procedure Date: 09/16/2019 MRN: [id number] Attending MD: Carol Ada , MD Date of Birth: 07/16/1926 CSN: [id number] Age: 85 Admit Type: Outpatient Procedure:                Upper GI endoscopy Indications:              Dysphagia Providers:                Carol Ada, MD, Carmie End, RN, William Dalton, Technician Referring MD:              Medicines:                Propofol per Anesthesia Complications:            No immediate complications. Estimated Blood Loss:     Estimated blood loss was minimal. Procedure:                Pre-Anesthesia Assessment:                           - Prior to the procedure, a History and Physical                            was performed, and patient medications and                            allergies were reviewed. The patient's tolerance of                            previous anesthesia was also reviewed. The risks                            and benefits of the procedure and the sedation                            options and risks were discussed with the patient.                            All questions were answered, and informed consent                            was obtained. Prior Anticoagulants: The patient has                            taken Xarelto (rivaroxaban), last dose was 2 days                            prior to procedure. ASA Grade Assessment: III - A                            patient with severe systemic disease. After  reviewing the risks and benefits, the patient was                            deemed in satisfactory condition to undergo the                            procedure.                           - Sedation was administered by an anesthesia                            professional. Deep sedation was attained.                           After obtaining informed consent, the endoscope was     passed under direct vision. Throughout the                            procedure, the patient's blood pressure, pulse, and                            oxygen saturations were monitored continuously. The                            [id number]-[id number] Olympus gastroscope was                            introduced through the mouth, and advanced to the                            second part of duodenum. The upper GI endoscopy was                            accomplished without difficulty. The patient                            tolerated the procedure well. Scope In: Scope Out: Findings:      One benign-appearing, intrinsic mild stenosis was found in the lower       third of the esophagus. This stenosis measured 1.7 cm (inner diameter) x       less than one cm (in length). The stenosis was traversed. A guidewire       was placed and the scope was withdrawn. Dilation was performed with a       Savary dilator with no resistance at 18 mm. The dilation site was       examined following endoscope reinsertion and showed complete resolution       of luminal narrowing. Estimated blood loss was minimal.      A 2 cm hiatal hernia was present.      The stomach was normal.      The examined duodenum was normal.      Per the patient's desire, the vocal cords were quickly imaged. There was       no overt abnormality identified. The distal esophageal stricture was  unmasked by the 18 mm Savary dilator. It was not grossly evident at the       time of inspection. Impression:               - Benign-appearing esophageal stenosis. Dilated.                           - 2 cm hiatal hernia.                           - Normal stomach.                           - Normal examined duodenum.                           - No specimens collected. Moderate Sedation:      Not Applicable - Patient had care per Anesthesia. Recommendation:           - Patient has a contact number available for                             emergencies. The signs and symptoms of potential                            delayed complications were discussed with the                            patient. Return to normal activities tomorrow.                            Written discharge instructions were provided to the                            patient.                           - Resume regular diet.                           - Continue present medications.                           - Return to GI clinic in 4 weeks.                           - Resume Xarelto tomorrow. Procedure Code(s):        --- Professional ---                           [id number], Esophagogastroduodenoscopy, flexible,                            transoral; with insertion of guide wire followed by                            passage of dilator(s) through esophagus over guide  wire Diagnosis Code(s):        --- Professional ---                           K22.2, Esophageal obstruction                           K44.9, Diaphragmatic hernia without obstruction or                            gangrene                           R13.10, Dysphagia, unspecified CPT copyright 2019 [employer] Medical Association. All rights reserved. The codes documented in this report are preliminary and upon coder review may  be revised to meet current compliance requirements. Carol Ada, MD Carol Ada, MD 09/16/2019 10:20:10 AM This report has been signed electronically. Number of Addenda: 0

## 2019-09-19 DIAGNOSIS — C44329 Squamous cell carcinoma of skin of other parts of face: Secondary | ICD-10-CM | POA: Diagnosis not present

## 2019-09-19 DIAGNOSIS — L988 Other specified disorders of the skin and subcutaneous tissue: Secondary | ICD-10-CM | POA: Diagnosis not present

## 2019-09-19 DIAGNOSIS — Z85828 Personal history of other malignant neoplasm of skin: Secondary | ICD-10-CM | POA: Diagnosis not present

## 2019-09-19 DIAGNOSIS — L570 Actinic keratosis: Secondary | ICD-10-CM | POA: Diagnosis not present

## 2019-09-19 DIAGNOSIS — D485 Neoplasm of uncertain behavior of skin: Secondary | ICD-10-CM | POA: Diagnosis not present

## 2019-10-14 DEATH — deceased

## 2019-10-30 ENCOUNTER — Encounter: Payer: Self-pay | Admitting: Cardiovascular Disease

## 2019-10-30 NOTE — Progress Notes (Signed)
Cardiology Office Note:    Date:  10/31/2019   ID:  Derek Schneider, DOB Aug 30, 1926, MRN [id number]  PCP:  Hayden Rasmussen, MD   Cardiologist:  Hien Cunliffe  Electrophysiologist:  None   Referring MD: Hayden Rasmussen, MD   Chief Complaint  Patient presents with  . Hypertension  . Atrial Fibrillation     Jan. 8, 2020    Derek Schneider is a 86 y.o. male who we are asked to see today for further evaluation of his atrial fibrillation at the request of Dr. Darron Doom.  Very healthy 86 yo man. Has been seen Dr. Wynonia Lawman in the past.   Walks 3-4 miles a day.   No CP or dyspnea, Also has hx of hyperlipidemia , HTN, esophageal spasm   Is a retired Clinical research associate Art gallery manager )   He is basically asymptomatic from an A. fib standpoint.  He cannot feel that his heartbeat is beating irregularly.  He can tell if he puts his hand on his pulse but otherwise cannot.  He remains very active.  May 03, 2019   Derek Schneider is very stable.   Walks 4 miles a day .  Tolerates his persistent AF quite well.   Oct 31, 2019 Derek Schneider is seen today for follow up of his atrial fib and HTN Still walks 3 miles a day .     Past Medical History:  Diagnosis Date  . Acoustic neuroma (HCC)    Right ear  . Asthma   . Atrial fibrillation ([place])   . Bronchitis, not specified as acute or chronic   . Diverticulosis of colon (without mention of hemorrhage)   . Hiatal hernia   . Internal hemorrhoids without mention of complication   . Personal history of colonic polyps 01/12/2008   TUBULOVILLOUS ADENOMA  . PVC (premature ventricular contraction)   . Stricture and stenosis of esophagus   . Unspecified essential hypertension   . Unspecified gastritis and gastroduodenitis without mention of hemorrhage   . Wears contact lenses    right eye    Past Surgical History:  Procedure Laterality Date  . CARDIAC CATHETERIZATION    . ESOPHAGOGASTRODUODENOSCOPY (EGD) WITH PROPOFOL N/A 09/16/2019   Procedure:  ESOPHAGOGASTRODUODENOSCOPY (EGD) WITH PROPOFOL;  Surgeon: Carol Ada, MD;  Location: [facility]COPY;  Service: Endoscopy;  Laterality: N/A;  . KNEE SURGERY  as a child   left patella removal as a child  . SAVORY DILATION N/A 09/16/2019   Procedure: SAVORY DILATION;  Surgeon: Carol Ada, MD;  Location: [facility] ENDOSCOPY;  Service: Endoscopy;  Laterality: N/A;  . TESTICLAR CYST EXCISION    . TONSILLECTOMY      Current Medications: Current Meds  Medication Sig  . albuterol (PROVENTIL HFA;VENTOLIN HFA) 108 (90 BASE) MCG/ACT inhaler Inhale 1 puff into the lungs every 6 (six) hours as needed for wheezing or shortness of breath.  . cholecalciferol (VITAMIN D) 1000 UNITS tablet Take 1,000 Units by mouth daily.  Marland Kitchen DEXILANT 60 MG capsule TAKE 1 CAPSULE BY MOUTH ONCE DAILY BEFORE BREAKFAST  . diltiazem (CARDIZEM CD) 120 MG 24 hr capsule Take 1 capsule (120 mg total) by mouth daily.  . finasteride (PROSCAR) 5 MG tablet Take 5 mg by mouth daily.   . furosemide (LASIX) 20 MG tablet Take 20 mg by mouth every other day.  . losartan-hydrochlorothiazide (HYZAAR) 100-25 MG tablet Take 1 tablet by mouth daily.  . nebivolol (BYSTOLIC) 5 MG tablet Take 1 tablet (5 mg total) by mouth daily.  Marland Kitchen  Rivaroxaban (XARELTO) 15 MG TABS tablet Take 1 tablet (15 mg total) by mouth daily with supper.  . sucralfate (CARAFATE) 1 g tablet Take 1 g by mouth 3 (three) times daily as needed (stomach).  . Tamsulosin HCl (FLOMAX) 0.4 MG CAPS Take 0.4 mg by mouth every evening.   . triamcinolone (KENALOG) 0.025 % cream Apply 1 application topically daily as needed (irritation).      Allergies:   Patient has no known allergies.   Social History   Socioeconomic History  . Marital status: Married    Spouse name: Not on file  . Number of children: 5  . Years of education: Not on file  . Highest education level: Not on file  Occupational History  . Occupation: Self employed    Employer: RETIRED  Tobacco Use  . Smoking status:  Never Smoker  . Smokeless tobacco: Never Used  Substance and Sexual Activity  . Alcohol use: No  . Drug use: No  . Sexual activity: Not on file  Other Topics Concern  . Not on file  Social History Narrative  . Not on file   Social Determinants of Health   Financial Resource Strain:   . Difficulty of Paying Living Expenses: Not on file  Food Insecurity:   . Worried About Charity fundraiser in the Last Year: Not on file  . Ran Out of Food in the Last Year: Not on file  Transportation Needs:   . Lack of Transportation (Medical): Not on file  . Lack of Transportation (Non-Medical): Not on file  Physical Activity:   . Days of Exercise per Week: Not on file  . Minutes of Exercise per Session: Not on file  Stress:   . Feeling of Stress : Not on file  Social Connections:   . Frequency of Communication with Friends and Family: Not on file  . Frequency of Social Gatherings with Friends and Family: Not on file  . Attends Religious Services: Not on file  . Active Member of Clubs or Organizations: Not on file  . Attends Archivist Meetings: Not on file  . Marital Status: Not on file     Family History: The patient's family history includes Cancer in his brother; Heart disease in his father; Kidney disease in his father; Lung cancer in his brother.  ROS:   Please see the history of present illness.     All other systems reviewed and are negative.  EKGs/Labs/Other Studies Reviewed:    The following studies were reviewed today:   Recent Labs: 05/03/2019: ALT 17; BUN 27; Creatinine, Ser 1.81; Potassium 3.8; Sodium 142  Recent Lipid Panel No results found for: CHOL, TRIG, HDL, CHOLHDL, VLDL, LDLCALC, LDLDIRECT  Physical Exam:    Physical Exam: Blood pressure (!) 142/80, pulse 77, height 5\' 7"  (1.702 m), weight 170 lb 12.8 oz (77.5 kg), SpO2 98 %.  GEN:  Elderly male,  NAD  HEENT: Normal NECK: No JVD; No carotid bruits LYMPHATICS: No lymphadenopathy CARDIAC:    Irreg. Irreg. , soft systolic murmur   RESPIRATORY:  Clear to auscultation without rales, wheezing or rhonchi  ABDOMEN: Soft, non-tender, non-distended MUSCULOSKELETAL:  No edema; No deformity  SKIN: Warm and dry NEUROLOGIC:  Alert and oriented x 3  EKG:    Jan. 18, 2021:   Atrial fib at 72.  Nonspecific ST abnormalities   ASSESSMENT:    1. Essential hypertension   2. Persistent atrial fibrillation ([place])   3. Longstanding persistent atrial fibrillation ([place])  4. Current use of long term anticoagulation    PLAN:    In order of problems listed above:  1.  Persistent atrial fibrillation:   Derek Schneider has persistent atrial fibrillation.  He tolerates this very well.  He walks 3 to 4 miles every day.  Continue Xarelto.  We will check a basic metabolic profile and CBC today.   2.  Hypertension: Blood pressure is well controlled.  Continue current medications.  Ive advised him to avoid salty foods.    3.  Chronic kidney disease:   Managed by his primary medical doctor.   Medication Adjustments/Labs and Tests Ordered: Current medicines are reviewed at length with the patient today.  Concerns regarding medicines are outlined above.  Orders Placed This Encounter  Procedures  . CBC  . Basic Metabolic Panel (BMET)  . EKG 12-Lead   No orders of the defined types were placed in this encounter.   Patient Instructions  Medication Instructions:  Your physician recommends that you continue on your current medications as directed. Please refer to the Current Medication list given to you today.  *If you need a refill on your cardiac medications before your next appointment, please call your pharmacy*  Lab Work: TODAY - CBC, BMET If you have labs (blood work) drawn today and your tests are completely normal, you will receive your results only by: Marland Kitchen MyChart Message (if you have MyChart) OR . A paper copy in the mail If you have any lab test that is abnormal or we need to change your  treatment, we will call you to review the results.   Testing/Procedures: None Ordered   Follow-Up: At [facility], you and your health needs are our priority.  As part of our continuing mission to provide you with exceptional heart care, we have created designated Provider Care Teams.  These Care Teams include your primary Cardiologist (physician) and Advanced Practice Providers (APPs -  Physician Assistants and Nurse Practitioners) who all work together to provide you with the care you need, when you need it.  Your next appointment:   1 year(s)  The format for your next appointment:   In Person  Provider:   You may see Mertie Moores, MD or one of the following Advanced Practice Providers on your designated Care Team:    Richardson Dopp, PA-C  [place], [place]  Daune Perch, [place]       Signed, Mertie Moores, MD  10/31/2019 8:56 AM    [place]

## 2019-10-31 ENCOUNTER — Encounter: Payer: Self-pay | Admitting: Cardiovascular Disease

## 2019-10-31 ENCOUNTER — Ambulatory Visit (INDEPENDENT_AMBULATORY_CARE_PROVIDER_SITE_OTHER): Payer: Medicare Other | Admitting: Cardiovascular Disease

## 2019-10-31 ENCOUNTER — Other Ambulatory Visit: Payer: Self-pay

## 2019-10-31 VITALS — BP 142/80 | HR 77 | Ht 67.0 in | Wt 170.8 lb

## 2019-10-31 DIAGNOSIS — I4811 Longstanding persistent atrial fibrillation: Secondary | ICD-10-CM | POA: Diagnosis not present

## 2019-10-31 DIAGNOSIS — I4819 Other persistent atrial fibrillation: Secondary | ICD-10-CM | POA: Diagnosis not present

## 2019-10-31 DIAGNOSIS — I10 Essential (primary) hypertension: Secondary | ICD-10-CM

## 2019-10-31 DIAGNOSIS — Z7901 Long term (current) use of anticoagulants: Secondary | ICD-10-CM | POA: Diagnosis not present

## 2019-10-31 LAB — CBC
Hematocrit: 40.1 % (ref 37.5–51.0)
Hemoglobin: 13.2 g/dL (ref 13.0–17.7)
MCH: 30 pg (ref 26.6–33.0)
MCHC: 32.9 g/dL (ref 31.5–35.7)
MCV: 91 fL (ref 79–97)
Platelets: 204 10*3/uL (ref 150–450)
RBC: 4.4 x10E6/uL (ref 4.14–5.80)
RDW: 13.1 % (ref 11.6–15.4)
WBC: 8.1 10*3/uL (ref 3.4–10.8)

## 2019-10-31 LAB — BASIC METABOLIC PANEL
BUN/Creatinine Ratio: 15 (ref 10–24)
BUN: 24 mg/dL (ref 10–36)
CO2: 24 mmol/L (ref 20–29)
Calcium: 9 mg/dL (ref 8.6–10.2)
Chloride: 104 mmol/L (ref 96–106)
Creatinine, Ser: 1.55 mg/dL — ABNORMAL HIGH (ref 0.76–1.27)
GFR calc Af Amer: 45 mL/min/{1.73_m2} — ABNORMAL LOW (ref >59)
GFR calc non Af Amer: 39 mL/min/{1.73_m2} — ABNORMAL LOW (ref >59)
Glucose: 117 mg/dL — ABNORMAL HIGH (ref 65–99)
Potassium: 3.9 mmol/L (ref 3.5–5.2)
Sodium: 143 mmol/L (ref 134–144)

## 2019-10-31 NOTE — Patient Instructions (Signed)
Medication Instructions:  Your physician recommends that you continue on your current medications as directed. Please refer to the Current Medication list given to you today.  *If you need a refill on your cardiac medications before your next appointment, please call your pharmacy*  Lab Work: TODAY - CBC, BMET If you have labs (blood work) drawn today and your tests are completely normal, you will receive your results only by: Marland Kitchen MyChart Message (if you have MyChart) OR . A paper copy in the mail If you have any lab test that is abnormal or we need to change your treatment, we will call you to review the results.   Testing/Procedures: None Ordered   Follow-Up: At [facility], you and your health needs are our priority.  As part of our continuing mission to provide you with exceptional heart care, we have created designated Provider Care Teams.  These Care Teams include your primary Cardiologist (physician) and Advanced Practice Providers (APPs -  Physician Assistants and Nurse Practitioners) who all work together to provide you with the care you need, when you need it.  Your next appointment:   1 year(s)  The format for your next appointment:   In Person  Provider:   You may see Mertie Moores, MD or one of the following Advanced Practice Providers on your designated Care Team:    Richardson Dopp, PA-C  [place], [place]  Daune Perch, [place]

## 2020-01-09 DIAGNOSIS — H0102A Squamous blepharitis right eye, upper and lower eyelids: Secondary | ICD-10-CM | POA: Diagnosis not present

## 2020-01-09 DIAGNOSIS — H2513 Age-related nuclear cataract, bilateral: Secondary | ICD-10-CM | POA: Diagnosis not present

## 2020-01-09 DIAGNOSIS — H0102B Squamous blepharitis left eye, upper and lower eyelids: Secondary | ICD-10-CM | POA: Diagnosis not present

## 2020-01-09 DIAGNOSIS — H0289 Other specified disorders of eyelid: Secondary | ICD-10-CM | POA: Diagnosis not present

## 2020-02-02 DIAGNOSIS — I509 Heart failure, unspecified: Secondary | ICD-10-CM | POA: Diagnosis not present

## 2020-02-02 DIAGNOSIS — E559 Vitamin D deficiency, unspecified: Secondary | ICD-10-CM | POA: Diagnosis not present

## 2020-02-02 DIAGNOSIS — E8770 Fluid overload, unspecified: Secondary | ICD-10-CM | POA: Diagnosis not present

## 2020-02-02 DIAGNOSIS — I4891 Unspecified atrial fibrillation: Secondary | ICD-10-CM | POA: Diagnosis not present

## 2020-02-02 DIAGNOSIS — R42 Dizziness and giddiness: Secondary | ICD-10-CM | POA: Diagnosis not present

## 2020-02-02 DIAGNOSIS — E785 Hyperlipidemia, unspecified: Secondary | ICD-10-CM | POA: Diagnosis not present

## 2020-02-02 DIAGNOSIS — I10 Essential (primary) hypertension: Secondary | ICD-10-CM | POA: Diagnosis not present

## 2020-02-02 DIAGNOSIS — J309 Allergic rhinitis, unspecified: Secondary | ICD-10-CM | POA: Diagnosis not present

## 2020-02-02 DIAGNOSIS — E569 Vitamin deficiency, unspecified: Secondary | ICD-10-CM | POA: Diagnosis not present

## 2020-02-02 DIAGNOSIS — K219 Gastro-esophageal reflux disease without esophagitis: Secondary | ICD-10-CM | POA: Diagnosis not present

## 2020-02-09 ENCOUNTER — Other Ambulatory Visit: Payer: Self-pay | Admitting: Cardiovascular Disease

## 2020-02-13 DIAGNOSIS — I10 Essential (primary) hypertension: Secondary | ICD-10-CM | POA: Diagnosis not present

## 2020-02-13 DIAGNOSIS — R42 Dizziness and giddiness: Secondary | ICD-10-CM | POA: Diagnosis not present

## 2020-02-13 DIAGNOSIS — J309 Allergic rhinitis, unspecified: Secondary | ICD-10-CM | POA: Diagnosis not present

## 2020-02-13 DIAGNOSIS — K219 Gastro-esophageal reflux disease without esophagitis: Secondary | ICD-10-CM | POA: Diagnosis not present

## 2020-03-01 DIAGNOSIS — Z974 Presence of external hearing-aid: Secondary | ICD-10-CM | POA: Diagnosis not present

## 2020-03-01 DIAGNOSIS — H903 Sensorineural hearing loss, bilateral: Secondary | ICD-10-CM | POA: Diagnosis not present

## 2020-03-01 DIAGNOSIS — D333 Benign neoplasm of cranial nerves: Secondary | ICD-10-CM | POA: Diagnosis not present

## 2020-03-01 DIAGNOSIS — D492 Neoplasm of unspecified behavior of bone, soft tissue, and skin: Secondary | ICD-10-CM | POA: Diagnosis not present

## 2020-03-01 DIAGNOSIS — H938X3 Other specified disorders of ear, bilateral: Secondary | ICD-10-CM | POA: Diagnosis not present

## 2020-03-01 DIAGNOSIS — H6123 Impacted cerumen, bilateral: Secondary | ICD-10-CM | POA: Diagnosis not present

## 2020-04-26 DIAGNOSIS — H2511 Age-related nuclear cataract, right eye: Secondary | ICD-10-CM | POA: Diagnosis not present

## 2020-04-28 ENCOUNTER — Other Ambulatory Visit: Payer: Self-pay | Admitting: Cardiovascular Disease

## 2020-05-05 ENCOUNTER — Other Ambulatory Visit: Payer: Self-pay | Admitting: Cardiovascular Disease

## 2020-05-07 MED ORDER — NEBIVOLOL HCL 5 MG PO TABS: 5.0000 mg | ORAL_TABLET | Freq: Every day | ORAL | 1 refills | Status: DC

## 2020-05-15 ENCOUNTER — Other Ambulatory Visit: Payer: Self-pay | Admitting: Cardiovascular Disease

## 2020-05-16 ENCOUNTER — Other Ambulatory Visit: Payer: Self-pay

## 2020-05-16 MED ORDER — DILTIAZEM HCL ER COATED BEADS 120 MG PO CP24: 120.0000 mg | ORAL_CAPSULE | Freq: Every day | ORAL | 1 refills | Status: DC

## 2020-05-21 DIAGNOSIS — K219 Gastro-esophageal reflux disease without esophagitis: Secondary | ICD-10-CM | POA: Diagnosis not present

## 2020-05-21 DIAGNOSIS — R42 Dizziness and giddiness: Secondary | ICD-10-CM | POA: Diagnosis not present

## 2020-05-21 DIAGNOSIS — I10 Essential (primary) hypertension: Secondary | ICD-10-CM | POA: Diagnosis not present

## 2020-05-21 DIAGNOSIS — J309 Allergic rhinitis, unspecified: Secondary | ICD-10-CM | POA: Diagnosis not present

## 2020-05-22 ENCOUNTER — Other Ambulatory Visit (HOSPITAL_COMMUNITY): Payer: Self-pay | Admitting: Family Medicine

## 2020-05-22 ENCOUNTER — Other Ambulatory Visit: Payer: Self-pay

## 2020-05-22 ENCOUNTER — Ambulatory Visit (HOSPITAL_COMMUNITY)
Admission: RE | Admit: 2020-05-22 | Discharge: 2020-05-22 | Disposition: A | Discharge status: Home / Self Care | Payer: Medicare Other | Source: Ambulatory Visit | Attending: Surgery | Admitting: Surgery

## 2020-05-22 DIAGNOSIS — I803 Phlebitis and thrombophlebitis of lower extremities, unspecified: Secondary | ICD-10-CM

## 2020-05-27 DIAGNOSIS — H2512 Age-related nuclear cataract, left eye: Secondary | ICD-10-CM | POA: Diagnosis not present

## 2020-05-31 DIAGNOSIS — H2512 Age-related nuclear cataract, left eye: Secondary | ICD-10-CM | POA: Diagnosis not present

## 2020-07-02 DIAGNOSIS — D1801 Hemangioma of skin and subcutaneous tissue: Secondary | ICD-10-CM | POA: Diagnosis not present

## 2020-07-02 DIAGNOSIS — L570 Actinic keratosis: Secondary | ICD-10-CM | POA: Diagnosis not present

## 2020-07-02 DIAGNOSIS — L309 Dermatitis, unspecified: Secondary | ICD-10-CM | POA: Diagnosis not present

## 2020-07-02 DIAGNOSIS — L814 Other melanin hyperpigmentation: Secondary | ICD-10-CM | POA: Diagnosis not present

## 2020-07-02 DIAGNOSIS — D0461 Carcinoma in situ of skin of right upper limb, including shoulder: Secondary | ICD-10-CM | POA: Diagnosis not present

## 2020-07-02 DIAGNOSIS — L821 Other seborrheic keratosis: Secondary | ICD-10-CM | POA: Diagnosis not present

## 2020-07-02 DIAGNOSIS — C44622 Squamous cell carcinoma of skin of right upper limb, including shoulder: Secondary | ICD-10-CM | POA: Diagnosis not present

## 2020-07-02 DIAGNOSIS — Z85828 Personal history of other malignant neoplasm of skin: Secondary | ICD-10-CM | POA: Diagnosis not present

## 2020-07-15 ENCOUNTER — Other Ambulatory Visit: Payer: Self-pay | Admitting: Cardiovascular Disease

## 2020-07-16 NOTE — Telephone Encounter (Signed)
Prescription refill request for Xarelto received.   Last office visit: 10/21/2019, Nahser Weight: 77.5 kg Age: 86 y.o. Scr:  1.55, 10/31/2019 CrCl: 34 ml/min   Prescription refill sent.

## 2020-07-23 DIAGNOSIS — K5900 Constipation, unspecified: Secondary | ICD-10-CM | POA: Diagnosis not present

## 2020-07-23 DIAGNOSIS — I10 Essential (primary) hypertension: Secondary | ICD-10-CM | POA: Diagnosis not present

## 2020-07-23 DIAGNOSIS — E559 Vitamin D deficiency, unspecified: Secondary | ICD-10-CM | POA: Diagnosis not present

## 2020-07-23 DIAGNOSIS — Z7901 Long term (current) use of anticoagulants: Secondary | ICD-10-CM | POA: Diagnosis not present

## 2020-07-23 DIAGNOSIS — E785 Hyperlipidemia, unspecified: Secondary | ICD-10-CM | POA: Diagnosis not present

## 2020-07-23 DIAGNOSIS — N320 Bladder-neck obstruction: Secondary | ICD-10-CM | POA: Diagnosis not present

## 2020-07-23 DIAGNOSIS — E119 Type 2 diabetes mellitus without complications: Secondary | ICD-10-CM | POA: Diagnosis not present

## 2020-07-23 DIAGNOSIS — K219 Gastro-esophageal reflux disease without esophagitis: Secondary | ICD-10-CM | POA: Diagnosis not present

## 2020-07-23 DIAGNOSIS — Z0001 Encounter for general adult medical examination with abnormal findings: Secondary | ICD-10-CM | POA: Diagnosis not present

## 2020-07-23 DIAGNOSIS — J45909 Unspecified asthma, uncomplicated: Secondary | ICD-10-CM | POA: Diagnosis not present

## 2020-07-23 DIAGNOSIS — N400 Enlarged prostate without lower urinary tract symptoms: Secondary | ICD-10-CM | POA: Diagnosis not present

## 2020-07-23 DIAGNOSIS — Z23 Encounter for immunization: Secondary | ICD-10-CM | POA: Diagnosis not present

## 2020-07-24 DIAGNOSIS — H6123 Impacted cerumen, bilateral: Secondary | ICD-10-CM | POA: Diagnosis not present

## 2020-07-24 DIAGNOSIS — H9312 Tinnitus, left ear: Secondary | ICD-10-CM | POA: Diagnosis not present

## 2020-07-26 DIAGNOSIS — N401 Enlarged prostate with lower urinary tract symptoms: Secondary | ICD-10-CM | POA: Diagnosis not present

## 2020-07-26 DIAGNOSIS — R351 Nocturia: Secondary | ICD-10-CM | POA: Diagnosis not present

## 2020-07-28 ENCOUNTER — Other Ambulatory Visit: Payer: Self-pay | Admitting: Cardiovascular Disease

## 2020-07-30 ENCOUNTER — Other Ambulatory Visit: Payer: Self-pay

## 2020-07-30 MED ORDER — LOSARTAN POTASSIUM-HCTZ 100-25 MG PO TABS: 1.0000 | ORAL_TABLET | Freq: Every day | ORAL | 0 refills | Status: DC

## 2020-08-16 DIAGNOSIS — Z23 Encounter for immunization: Secondary | ICD-10-CM | POA: Diagnosis not present

## 2020-09-04 DIAGNOSIS — I10 Essential (primary) hypertension: Secondary | ICD-10-CM | POA: Diagnosis not present

## 2020-09-04 DIAGNOSIS — N183 Chronic kidney disease, stage 3 unspecified: Secondary | ICD-10-CM | POA: Diagnosis not present

## 2020-09-04 DIAGNOSIS — Z23 Encounter for immunization: Secondary | ICD-10-CM | POA: Diagnosis not present

## 2020-09-04 DIAGNOSIS — Z0001 Encounter for general adult medical examination with abnormal findings: Secondary | ICD-10-CM | POA: Diagnosis not present

## 2020-09-04 DIAGNOSIS — K219 Gastro-esophageal reflux disease without esophagitis: Secondary | ICD-10-CM | POA: Diagnosis not present

## 2020-09-05 ENCOUNTER — Other Ambulatory Visit: Payer: Self-pay

## 2020-09-05 MED ORDER — NEBIVOLOL HCL 5 MG PO TABS: 5.0000 mg | ORAL_TABLET | Freq: Every day | ORAL | 0 refills | Status: DC

## 2020-09-13 DIAGNOSIS — M25552 Pain in left hip: Secondary | ICD-10-CM | POA: Diagnosis not present

## 2020-10-21 ENCOUNTER — Other Ambulatory Visit: Payer: Self-pay | Admitting: Cardiovascular Disease

## 2020-11-02 ENCOUNTER — Encounter: Payer: Self-pay | Admitting: Cardiovascular Disease

## 2020-11-02 ENCOUNTER — Ambulatory Visit (INDEPENDENT_AMBULATORY_CARE_PROVIDER_SITE_OTHER): Payer: Medicare Other | Admitting: Cardiovascular Disease

## 2020-11-02 ENCOUNTER — Other Ambulatory Visit: Payer: Self-pay

## 2020-11-02 VITALS — BP 124/76 | HR 72 | Ht 67.0 in | Wt 171.8 lb

## 2020-11-02 DIAGNOSIS — I10 Essential (primary) hypertension: Secondary | ICD-10-CM | POA: Diagnosis not present

## 2020-11-02 DIAGNOSIS — I4811 Longstanding persistent atrial fibrillation: Secondary | ICD-10-CM

## 2020-11-02 NOTE — Patient Instructions (Signed)
Medication Instructions:  The current medical regimen is effective;  continue present plan and medications.  *If you need a refill on your cardiac medications before your next appointment, please call your pharmacy*  Follow-Up: At [facility], you and your health needs are our priority.  As part of our continuing mission to provide you with exceptional heart care, we have created designated Provider Care Teams.  These Care Teams include your primary Cardiologist (physician) and Advanced Practice Providers (APPs -  Physician Assistants and Nurse Practitioners) who all work together to provide you with the care you need, when you need it.  We recommend signing up for the patient portal called "MyChart".  Sign up information is provided on this After Visit Summary.  MyChart is used to connect with patients for Virtual Visits (Telemedicine).  Patients are able to view lab/test results, encounter notes, upcoming appointments, etc.  Non-urgent messages can be sent to your provider as well.   To learn more about what you can do with MyChart, go to [email or web address].    Your next appointment:   12 month(s)  The format for your next appointment:   In Person  Provider:   Philip Nahser, MD  Thank you for choosing [facility]!!     

## 2020-11-02 NOTE — Progress Notes (Signed)
Cardiology Office Note:    Date:  11/02/2020   ID:  LEONIDUS ROWAND, DOB 1926/03/19, MRN [id number]  PCP:  Hayden Rasmussen, MD   Cardiologist:  Janith Nielson  Electrophysiologist:  None   Referring MD: Hayden Rasmussen, MD   Chief Complaint  Patient presents with   Hypertension     Jan. 8, 2020    Derek Schneider is a 87 y.o. male who we are asked to see today for further evaluation of his atrial fibrillation at the request of Dr. Darron Doom.  Very healthy 87 yo man. Has been seen Dr. Wynonia Lawman in the past.   Walks 3-4 miles a day.   No CP or dyspnea, Also has hx of hyperlipidemia , HTN, esophageal spasm   Is a retired Clinical research associate Art gallery manager )   He is basically asymptomatic from an A. fib standpoint.  He cannot feel that his heartbeat is beating irregularly.  He can tell if he puts his hand on his pulse but otherwise cannot.  He remains very active.  May 03, 2019   [place] is very stable.   Walks 4 miles a day .  Tolerates his persistent AF quite well.   Oct 31, 2019 D[place]in is seen today for follow up of his atrial fib and HTN Still walks 3 miles a day .  Jan. 21, 2022: Seen for his atrial fib. Has a hx of esophageal spasm,  Has some scarring on his esophagus.  Has a hx of asthma  Wakes up occasionally with dyspnea. But he walks 3 miles a day without any CP or dyspnea.  He has mod MR , mild AI   Past Medical History:  Diagnosis Date   Acoustic neuroma ([place])    Right ear   Asthma    Atrial fibrillation ([place])    Bronchitis, not specified as acute or chronic    Diverticulosis of colon (without mention of hemorrhage)    Hiatal hernia    Internal hemorrhoids without mention of complication    Personal history of colonic polyps 01/12/2008   TUBULOVILLOUS ADENOMA   PVC (premature ventricular contraction)    Stricture and stenosis of esophagus    Unspecified essential hypertension    Unspecified gastritis and gastroduodenitis without mention of hemorrhage     Wears contact lenses    right eye    Past Surgical History:  Procedure Laterality Date   CARDIAC CATHETERIZATION     ESOPHAGOGASTRODUODENOSCOPY (EGD) WITH PROPOFOL N/A 09/16/2019   Procedure: ESOPHAGOGASTRODUODENOSCOPY (EGD) WITH PROPOFOL;  Surgeon: Carol Ada, MD;  Location: [facility];  Service: Endoscopy;  Laterality: N/A;   KNEE SURGERY  as a child   left patella removal as a child   SAVORY DILATION N/A 09/16/2019   Procedure: SAVORY DILATION;  Surgeon: Carol Ada, MD;  Location: [facility];  Service: Endoscopy;  Laterality: N/A;   TESTICLAR CYST EXCISION     TONSILLECTOMY      Current Medications: Current Meds  Medication Sig   BREO ELLIPTA 200-25 MCG/INH AEPB Inhale 1 puff into the lungs daily.   cholecalciferol (VITAMIN D) 1000 UNITS tablet Take 1,000 Units by mouth daily.   DEXILANT 60 MG capsule TAKE 1 CAPSULE BY MOUTH ONCE DAILY BEFORE BREAKFAST   diltiazem (CARDIZEM CD) 120 MG 24 hr capsule Take 1 capsule (120 mg total) by mouth daily.   diltiazem (CARDIZEM) 30 MG tablet Take 30 mg by mouth as needed.   finasteride (PROSCAR) 5 MG tablet Take 5 mg by mouth daily.  losartan-hydrochlorothiazide (HYZAAR) 100-25 MG tablet Take 1 tablet by mouth once daily   nebivolol (BYSTOLIC) 5 MG tablet Take 1 tablet (5 mg total) by mouth daily. Please keep upcoming appt in January 2022 with Dr. Acie Fredrickson before anymore refills. Thank you   Tamsulosin HCl (FLOMAX) 0.4 MG CAPS Take 0.4 mg by mouth every evening.    triamcinolone (KENALOG) 0.025 % cream Apply 1 application topically daily as needed (irritation).    triamcinolone (KENALOG) 0.1 % Apply 1 application topically 2 (two) times daily as needed.   XARELTO 15 MG TABS tablet TAKE 1 TABLET BY MOUTH ONCE DAILY WITH  SUPPER   [DISCONTINUED] albuterol (PROVENTIL HFA;VENTOLIN HFA) 108 (90 BASE) MCG/ACT inhaler Inhale 1 puff into the lungs every 6 (six) hours as needed for wheezing or shortness of breath.      Allergies:   Patient has no known allergies.   Social History   Socioeconomic History   Marital status: Married    Spouse name: Not on file   Number of children: 5   Years of education: Not on file   Highest education level: Not on file  Occupational History   Occupation: Self employed    Employer: RETIRED  Tobacco Use   Smoking status: Never Smoker   Smokeless tobacco: Never Used  Scientific laboratory technician Use: Never used  Substance and Sexual Activity   Alcohol use: No   Drug use: No   Sexual activity: Not on file  Other Topics Concern   Not on file  Social History Narrative   Not on file   Social Determinants of Health   Financial Resource Strain: Not on file  Food Insecurity: Not on file  Transportation Needs: Not on file  Physical Activity: Not on file  Stress: Not on file  Social Connections: Not on file     Family History: The patient's family history includes Cancer in his brother; Heart disease in his father; Kidney disease in his father; Lung cancer in his brother.  ROS:   Please see the history of present illness.     All other systems reviewed and are negative.  EKGs/Labs/Other Studies Reviewed:    The following studies were reviewed today:   Recent Labs: No results found for requested labs within last 8760 hours.  Recent Lipid Panel No results found for: CHOL, TRIG, HDL, CHOLHDL, VLDL, LDLCALC, LDLDIRECT  Physical Exam:    Physical Exam: Blood pressure 124/76, pulse 72, height 5\' 7"  (1.702 m), weight 171 lb 12.8 oz (77.9 kg), SpO2 98 %.  GEN:    Elderly male ,  HEENT: Normal NECK: No JVD; No carotid bruits LYMPHATICS: No lymphadenopathy CARDIAC: Irreg. Irreg.  RESPIRATORY:  Clear to auscultation without rales, wheezing or rhonchi  ABDOMEN: Soft, non-tender, non-distended MUSCULOSKELETAL:  No edema; No deformity  SKIN: Warm and dry NEUROLOGIC:  Alert and oriented x 3  ECG :  Jan. 21, 2022:   A-fib .   No changes from  previous ECG   ASSESSMENT:    1. Essential hypertension   2. Longstanding persistent atrial fibrillation (HCC)    PLAN:    In order of problems listed above:  1.  Persistent atrial fibrillation:    His Afib is well tolerated.   Cont xarelto    2.  Hypertension: BP is well controlled.   3.  Chronic kidney disease:     Stable, managed     Medication Adjustments/Labs and Tests Ordered: Current medicines are reviewed at length with the  patient today.  Concerns regarding medicines are outlined above.  Orders Placed This Encounter  Procedures   EKG 12-Lead   No orders of the defined types were placed in this encounter.   Patient Instructions  Medication Instructions:  The current medical regimen is effective;  continue present plan and medications.  *If you need a refill on your cardiac medications before your next appointment, please call your pharmacy*  Follow-Up: At [facility], you and your health needs are our priority.  As part of our continuing mission to provide you with exceptional heart care, we have created designated Provider Care Teams.  These Care Teams include your primary Cardiologist (physician) and Advanced Practice Providers (APPs -  Physician Assistants and Nurse Practitioners) who all work together to provide you with the care you need, when you need it.  We recommend signing up for the patient portal called "MyChart".  Sign up information is provided on this After Visit Summary.  MyChart is used to connect with patients for Virtual Visits (Telemedicine).  Patients are able to view lab/test results, encounter notes, upcoming appointments, etc.  Non-urgent messages can be sent to your provider as well.   To learn more about what you can do with MyChart, go to [email or web address].    Your next appointment:   12 month(s)  The format for your next appointment:   In Person  Provider:   Mertie Moores, MD   Thank you for choosing [facility]!!        Signed, Mertie Moores, MD  11/02/2020 10:14 AM    [place]

## 2020-12-29 ENCOUNTER — Other Ambulatory Visit: Payer: Self-pay | Admitting: Cardiovascular Disease

## 2021-01-02 ENCOUNTER — Other Ambulatory Visit: Payer: Self-pay | Admitting: Cardiovascular Disease

## 2021-01-03 NOTE — Telephone Encounter (Signed)
Prescription refill request for Xarelto received.  Indication: Afib Last office visit: [place], 11/02/2020 Weight: 77.9 kg  Age: 87 yo  Scr:  1.55, 10/31/2019 CrCl:

## 2021-01-03 NOTE — Telephone Encounter (Signed)
Scr: 1.74, 09/05/2020 CrCl: 29.85 ml/min    Pt is on the correct dose of Xarelto per dosing criteria, prescription refill sent for Xarelto 15mg  daily.

## 2021-01-03 NOTE — Telephone Encounter (Signed)
Attempted to call pt's PCP office to see if they had any updated labs. Dr Richter's office open at [place]. Will attempt to call office at a later time.

## 2021-01-03 NOTE — Telephone Encounter (Signed)
Called and spoke to Dr. Roderic Ovens office who stated that the pt has blood work back in November 2021, they stated that they would fax over a copy of the blood work.

## 2021-01-31 ENCOUNTER — Other Ambulatory Visit: Payer: Self-pay | Admitting: Cardiovascular Disease

## 2021-02-01 MED ORDER — DILTIAZEM HCL ER COATED BEADS 120 MG PO CP24: 120.0000 mg | ORAL_CAPSULE | Freq: Every day | ORAL | 2 refills | Status: DC

## 2021-02-21 ENCOUNTER — Telehealth: Payer: Self-pay | Admitting: Cardiovascular Disease

## 2021-02-21 NOTE — Telephone Encounter (Signed)
Pt with SOB x 3 days.  Occurs intermittently, but not associated with exertion.  Can occur even when resting.  States vitals usually run around 120s/70s, 65-70.  More recently BP has dropped as low as 97/50s with HRs in the 40s.  HR currently in the 50s.  No symptoms right now.  Not audibly SOB on the phone.  I did not mention going to the ER but pt states he is not going to go because he doesn't feel the symptoms are bad enough. Pt currently scheduled to see Melina Copa, PA-C on Tuesday.  Advised I will send to Dr. Acie Fredrickson for review.

## 2021-02-21 NOTE — Telephone Encounter (Signed)
Spoke with pt and made him aware no new recommendations and keep appt on Tuesday with Dayna.  Pt appreciative for call.

## 2021-02-21 NOTE — Telephone Encounter (Signed)
He will follow up with Dayna on Tuesday

## 2021-02-21 NOTE — Telephone Encounter (Signed)
Pt c/o Shortness Of Breath: STAT if SOB developed within the last 24 hours or pt is noticeably SOB on the phone  1. Are you currently SOB (can you hear that pt is SOB on the phone)?  Yes   2. How long have you been experiencing SOB?  2-3 days   3. Are you SOB when sitting or when up moving around?  Both   4. Are you currently experiencing any other symptoms?  Low HR   STAT if HR is under 50 or over 120 (normal HR is 60-100 beats per minute)  1) What is your heart rate?  51   2) Do you have a log of your heart rate readings (document readings)?  65, 47, 51  3) Do you have any other symptoms?  No

## 2021-02-24 ENCOUNTER — Encounter: Payer: Self-pay | Admitting: Physician Assistant

## 2021-02-24 NOTE — Progress Notes (Signed)
Cardiology Office Note    Date:  02/26/2021   ID:  BRYEN HINDERMAN, DOB June 08, 1926, MRN [id number]  PCP:  Hayden Rasmussen, MD  Cardiologist:  Mertie Moores, MD  Electrophysiologist:  None   Chief Complaint: SOB  History of Present Illness:   Derek Schneider is a 87 y.o. male with history of persistent atrial fibrillation, mild AI, moderate MR, mild CAD by cath 2009, HTN, HLD, esophageal spasm, CKD stage III, acoustic neuroma, hiatal hernia who presents for evaluation of SOB. He was a previous pt of Dr. Wynonia Lawman but has since followed with Dr. Acie Fredrickson. Remote cath 2009 showed diffuse diesase of the LAD and diagonal branches without obstruction, 30% prox LAD, otherwise scattered irregularities. His atrial fib appears to have been managed with strategy of rate control and anticoagulation over the years. Last echo 10/2018 with mild LVH, EF 60-65%, mild AI, moderate MR, mild-moderate LAE. Historically he is very active and walks regularly.  He presents for evaluation of shortness of breath, also reports a history of chronic dizziness. 1) SOB - present for about a month, mostly notable when lying back at night to sleep. No DOE during the daytime. Denies any CP whatsoever. Has noticed mild LEE. He states he just assumed this is because of his afib and asthma. BP well controlled. 2) Dizziness - reports this is a chronic complaint, mainly positional - happens if he turns to lay on his right side, described as the bed spinning. Improves nearly immediately when lying on the opposite side. States he's previously followed by Dr. Thornell Mule for this whom he states could not reproduce this but felt possibly related to his inner ear issues. This is not really a major daytime issue otherwise. He has noticed checking his HR at home recently that it was in the 74s. This was not specifically correlated with a symptom itself. No reproducible orthostatic sx. EKG here shows HR in the 70s with one PVC. BP normal. No  pre-syncope, syncope or falls.   Labwork independently reviewed: 08/2020 Cr 1.74, K 4.6, LFTs wnl 10/2019 Cr 1.55, CBC wnl 04/2019 Cr 1.81   Past Medical History:  Diagnosis Date  . Acoustic neuroma (HCC)    Right ear  . Aortic insufficiency   . Asthma   . Bronchitis, not specified as acute or chronic   . Diverticulosis of colon (without mention of hemorrhage)   . Hiatal hernia   . Internal hemorrhoids without mention of complication   . Mild CAD   . Mitral regurgitation   . Persistent atrial fibrillation ([place])   . Personal history of colonic polyps 01/12/2008   TUBULOVILLOUS ADENOMA  . PVC (premature ventricular contraction)   . Stricture and stenosis of esophagus   . Unspecified essential hypertension   . Unspecified gastritis and gastroduodenitis without mention of hemorrhage   . Wears contact lenses    right eye    Past Surgical History:  Procedure Laterality Date  . CARDIAC CATHETERIZATION    . ESOPHAGOGASTRODUODENOSCOPY (EGD) WITH PROPOFOL N/A 09/16/2019   Procedure: ESOPHAGOGASTRODUODENOSCOPY (EGD) WITH PROPOFOL;  Surgeon: Carol Ada, MD;  [facility];  Service: Endoscopy;  Laterality: N/A;  . KNEE SURGERY  as a child   left patella removal as a child  . SAVORY DILATION N/A 09/16/2019   Procedure: SAVORY DILATION;  Surgeon: Carol Ada, MD;  [facility]: [facility];  Service: Endoscopy;  Laterality: N/A;  . TESTICLAR CYST EXCISION    . TONSILLECTOMY      Current Medications:  Current Meds  Medication Sig  . BREO ELLIPTA 200-25 MCG/INH AEPB Inhale 1 puff into the lungs daily.  . cholecalciferol (VITAMIN D) 1000 UNITS tablet Take 1,000 Units by mouth daily.  Marland Kitchen DEXILANT 60 MG capsule TAKE 1 CAPSULE BY MOUTH ONCE DAILY BEFORE BREAKFAST  . diltiazem (CARDIZEM CD) 120 MG 24 hr capsule Take 1 capsule (120 mg total) by mouth daily.  Marland Kitchen diltiazem (CARDIZEM) 30 MG tablet Take 30 mg by mouth as needed.  . finasteride (PROSCAR) 5 MG tablet Take 5 mg by mouth  daily.   Marland Kitchen losartan-hydrochlorothiazide (HYZAAR) 100-25 MG tablet Take 1 tablet by mouth once daily  . nebivolol (BYSTOLIC) 5 MG tablet Take 1 tablet by mouth once daily  . Tamsulosin HCl (FLOMAX) 0.4 MG CAPS Take 0.4 mg by mouth every evening.   . triamcinolone (KENALOG) 0.1 % Apply 1 application topically 2 (two) times daily as needed.  Alveda Reasons 15 MG TABS tablet TAKE 1 TABLET BY MOUTH ONCE DAILY WITH SUPPER     Allergies:   Patient has no known allergies.   Social History   Socioeconomic History  . Marital status: Married    Spouse name: Not on file  . Number of children: 5  . Years of education: Not on file  . Highest education level: Not on file  Occupational History  . Occupation: Self employed    Employer: RETIRED  Tobacco Use  . Smoking status: Never Smoker  . Smokeless tobacco: Never Used  Vaping Use  . Vaping Use: Never used  Substance and Sexual Activity  . Alcohol use: No  . Drug use: No  . Sexual activity: Not on file  Other Topics Concern  . Not on file  Social History Narrative  . Not on file   Social Determinants of Health   Financial Resource Strain: Not on file  Food Insecurity: Not on file  Transportation Needs: Not on file  Physical Activity: Not on file  Stress: Not on file  Social Connections: Not on file     Family History:  The patient's family history includes Cancer in his brother; Heart disease in his father; Kidney disease in his father; Lung cancer in his brother.  ROS:   Please see the history of present illness.  All other systems are reviewed and otherwise negative.    EKGs/Labs/Other Studies Reviewed:    Studies reviewed are outlined and summarized above. Reports included below if pertinent.  2D Echo 10/2018 - Left ventricle: The cavity size was normal. Wall thickness was  increased in a pattern of mild LVH. There was focal basal  hypertrophy. Systolic function was normal. The estimated ejection  fraction was in the  range of 60% to 65%. Wall motion was normal;  there were no regional wall motion abnormalities.  - Aortic valve: There was mild regurgitation.  - Mitral valve: There was moderate regurgitation.  - Left atrium: The atrium was mildly to moderately dilated.    2009 Cath DATE OF DISCHARGE:  07/03/2008                            CARDIAC CATHETERIZATION   HISTORY:  The patient has a history of mild coronary artery disease with  last catheterization done in December 2000.  He had multiple visits to  the emergency room and to the office this year complaining of chest pain  with some radiation into the left arm.  He has a previous history  of  esophageal spasm.  He has been having continued shoulder pain.   PROCEDURE:  Left heart catheterization with coronary angiograms and left  ventriculogram.   PROCEDURE IN DETAIL:  The procedure was done in the outpatient  diagnostic laboratory.  The right femoral artery was entered using a  single anterior needle wall stick and a 4-French sheath was placed.  The  procedure was done with a 4-French catheters and a 30 mL ventriculogram  was performed.  He tolerated the procedure well.   HEMODYNAMIC DATA:  Left ventricular 140/12-20, aorta postcontrast  148/61.  Coronary arteries arise and distribute normally.  There is very  mild calcification involving the coronary arteries.  Left main coronary  artery with mild irregularity.  Left anterior descending:  The vessel  was mildly calcified proximally.  A series of 3 diagonal branches arise  off the artery.  There is diffuse disease involving the LAD and the  diagonal branches that is not severely obstructive.  The optional  diagonal or intermediate branch arises, which could be considered the  first diagonal branch.  There is an eccentric 30% proximal LAD stenosis  after the origin of this diagonal branch.  The circumflex coronary  artery:  Contains no significant disease.  Right coronary artery:   Dominant vessel with scattered irregularities supplies the posterior  descending and a series of several small posterolateral branches.   IMPRESSION:  1. Mild coronary artery disease, predominantly involving the left      anterior descending coronary artery with scattered irregularities      elsewhere.  2. Normal left ventricular function.    Ezzard Standing, M.D.  Electronically Signed     EKG:  EKG is ordered today, personally reviewed, demonstrating atrial fib 74bpm, one PVC, LAD, no acute STT changes  Recent Labs: No results found for requested labs within last 8760 hours. No results found for: CHOL, TRIG, HDL, CHOLHDL, VLDL, LDLCALC, LDLDIRECT Recent Lipid Panel   PHYSICAL EXAM:    VS:  BP 118/70   Pulse 74   Ht 5\' 7"  (1.702 m)   Wt 175 lb 6.4 oz (79.6 kg)   SpO2 98%   BMI 27.47 kg/m   BMI: Body mass index is 27.47 kg/m.  GEN: Well nourished, well developed male in no acute distress HEENT: normocephalic, atraumatic Neck: no JVD, carotid bruits, or masses Cardiac: irregularly irregular, rate controlled; no murmurs, rubs, or gallops, no edema  Respiratory:  clear to auscultation bilaterally, normal work of breathing GI: soft, nontender, nondistended, + BS MS: no deformity or atrophy Skin: warm and dry, no rash Neuro:  Alert and Oriented x 3, Strength and sensation are intact, follows commands Psych: euthymic mood, full affect  Wt Readings from Last 3 Encounters:  02/26/21 175 lb 6.4 oz (79.6 kg)  11/02/20 171 lb 12.8 oz (77.9 kg)  10/31/19 170 lb 12.8 oz (77.5 kg)     ASSESSMENT & PLAN:   1. Shortness of breath - mostly described as orthopnea, without any other features of DOE or daytime dyspnea. He has mild volume excess on exam with mild LEE. Will check CBC, BMET, BNP and arrange 2D echo to evaluate for any progression in valvular disease. May consider short trial of Lasix contingent on labs with close follow-up.  2. Dizziness - mainly positional and  agree this is likely inner ear related. However, see below regarding HR. Would recommend 7 day monitor to trend heart rate. He states he's worn several monitors in the past for  this complaint which were unrevealing but I do not see this has been anytime recent (no prior monitors in EMR). Warning sx reviewed. Will also check labs to exclude acute metabolic change like anemia.  3. Persistent atrial fibrillation, also PVC noted on EKG - rate controlled. He reports seeing a recent HR in the 40s but also has PVC noted on EKG - since HR is normal here in office by EKG, question whether these are being adequately captured. Would recommend 7 day monitor for evaluation of HR. Continue present regimen in the interim pending results. He states he's worn several monitors in the past that were unrevealing but I do not see these results in Epic. Check TSH, BMET, MG, CBC today. He is historically on lower dose Xarelto due to CrCl.   3. Essential HTN - BP controlled. No changes today.  4. Mild CAD - not on ASA due to concomitant Xarelto. Our office has not historically followed lipids - would defer to PCP given age. No recent angina. Await echocardiogram.  5. Mild AI, moderate MR by echo 2020 - will follow up echocardiogram to determine if any valvular changes.   Disposition: F/u with myself or Dr. Acie Fredrickson after echocardiogram.  Medication Adjustments/Labs and Tests Ordered: Current medicines are reviewed at length with the patient today.  Concerns regarding medicines are outlined above. Medication changes, Labs and Tests ordered today are summarized above and listed in the Patient Instructions accessible in Encounters.   Signed, Charlie Pitter, PA-C  02/26/2021 8:51 AM    [facility] [address], [place], [place]  [address] Phone: [phone]; Fax: [phone]

## 2021-02-26 ENCOUNTER — Encounter: Payer: Self-pay | Admitting: Physician Assistant

## 2021-02-26 ENCOUNTER — Other Ambulatory Visit: Payer: Self-pay

## 2021-02-26 ENCOUNTER — Ambulatory Visit (INDEPENDENT_AMBULATORY_CARE_PROVIDER_SITE_OTHER): Payer: Medicare Other

## 2021-02-26 ENCOUNTER — Ambulatory Visit (INDEPENDENT_AMBULATORY_CARE_PROVIDER_SITE_OTHER): Payer: Medicare Other | Admitting: Physician Assistant

## 2021-02-26 VITALS — BP 118/70 | HR 74 | Ht 67.0 in | Wt 175.4 lb

## 2021-02-26 DIAGNOSIS — I10 Essential (primary) hypertension: Secondary | ICD-10-CM

## 2021-02-26 DIAGNOSIS — I4819 Other persistent atrial fibrillation: Secondary | ICD-10-CM | POA: Diagnosis not present

## 2021-02-26 DIAGNOSIS — R0602 Shortness of breath: Secondary | ICD-10-CM

## 2021-02-26 DIAGNOSIS — I340 Nonrheumatic mitral (valve) insufficiency: Secondary | ICD-10-CM

## 2021-02-26 DIAGNOSIS — I493 Ventricular premature depolarization: Secondary | ICD-10-CM | POA: Diagnosis not present

## 2021-02-26 DIAGNOSIS — R42 Dizziness and giddiness: Secondary | ICD-10-CM

## 2021-02-26 DIAGNOSIS — I2510 Atherosclerotic heart disease of native coronary artery without angina pectoris: Secondary | ICD-10-CM | POA: Diagnosis not present

## 2021-02-26 DIAGNOSIS — I351 Nonrheumatic aortic (valve) insufficiency: Secondary | ICD-10-CM

## 2021-02-26 NOTE — Progress Notes (Unsigned)
Patient enrolled for [employer] to ship a 7 day ZIO XT long term holter monitor to his home.

## 2021-02-26 NOTE — Patient Instructions (Addendum)
Medication Instructions:  Your physician recommends that you continue on your current medications as directed. Please refer to the Current Medication list given to you today.  *If you need a refill on your cardiac medications before your next appointment, please call your pharmacy*   Lab Work: Your physician recommends that you have lab work today: bmet/mag/pro bnp/cbc/tsh.  If you have labs (blood work) drawn today and your tests are completely normal, you will receive your results only by: Marland [facility] MyChart Message (if you have MyChart) OR . A paper copy in the mail If you have any lab test that is abnormal or we need to change your treatment, we will call you to review the results.   Testing/Procedures: Your physician has requested that you have an echocardiogram. Tuesday, June 14 @ 11:35 am. Echocardiography is a painless test that uses sound waves to create images of your heart. It provides your doctor with information about the size and shape of your heart and how well your heart's chambers and valves are working. This procedure takes approximately one hour. There are no restrictions for this procedure.  ZIO XT- Long Term Monitor Instructions   Your physician has requested you wear a ZIO patch monitor for _7__ days.  This is a single patch monitor.   [employer] supplies one patch monitor per enrollment. Additional stickers are not available. Please do not apply patch if you will be having a Nuclear Stress Test, Echocardiogram, Cardiac CT, MRI, or Chest Xray during the period you would be wearing the monitor. The patch cannot be worn during these tests. You cannot remove and re-apply the ZIO XT patch monitor.  Your ZIO patch monitor will be sent Fed Ex from [employer] directly to your home address. It may take 3-5 days to receive your monitor after you have been enrolled.  Once you have received your monitor, please review the enclosed instructions. Your monitor has already been registered  assigning a specific monitor serial # to you.  Billing and Patient Assistance Program Information   We have supplied [employer] with any of your insurance information on file for billing purposes. [employer] offers a sliding scale Patient Assistance Program for patients that do not have insurance, or whose insurance does not completely cover the cost of the ZIO monitor.   You must apply for the Patient Assistance Program to qualify for this discounted rate.     To apply, please call [employer] at [phone], select option 4, then select option 2, and ask to apply for Patient Assistance Program.  Theodore Demark will ask your household income, and how many people are in your household.  They will quote your out-of-pocket cost based on that information.  [employer] will also be able to set up a 76-month, interest-free payment plan if needed.  Applying the monitor   Shave hair from upper left chest.  Hold abrader disc by orange tab. Rub abrader in 40 strokes over the upper left chest as indicated in your monitor instructions.  Clean area with 4 enclosed alcohol pads. Let dry.  Apply patch as indicated in monitor instructions. Patch will be placed under collarbone on left side of chest with arrow pointing upward.  Rub patch adhesive wings for 2 minutes. Remove white label marked "1". Remove the white label marked "2". Rub patch adhesive wings for 2 additional minutes.  While looking in a mirror, press and release button in center of patch. A small green light will flash 3-4 times. This will be your only indicator that  the monitor has been turned on. ?  Do not shower for the first 24 hours. You may shower after the first 24 hours.  Press the button if you feel a symptom. You will hear a small click. Record Date, Time and Symptom in the Patient Logbook.  When you are ready to remove the patch, follow instructions on the last 2 pages of the Patient Logbook. Stick patch monitor onto the last page of Patient Logbook.  Place  Patient Logbook in the blue and white box.  Use locking tab on box and tape box closed securely.  The blue and white box has prepaid postage on it. Please place it in the mailbox as soon as possible. Your physician should have your test results approximately 7 days after the monitor has been mailed back to [facility].  Call [facility] at [phone] if you have questions regarding your ZIO XT patch monitor. Call them immediately if you see an orange light blinking on your monitor.  If your monitor falls off in less than 4 days, contact our Monitor department at [phone]. ?If your monitor becomes loose or falls off after 4 days call [employer] at [phone] for suggestions on securing your monitor.?    Follow-Up: At [facility], you and your health needs are our priority.  As part of our continuing mission to provide you with exceptional heart care, we have created designated Provider Care Teams.  These Care Teams include your primary Cardiologist (physician) and Advanced Practice Providers (APPs -  Physician Assistants and Nurse Practitioners) who all work together to provide you with the care you need, when you need it.  We recommend signing up for the patient portal called "MyChart".  Sign up information is provided on this After Visit Summary.  MyChart is used to connect with patients for Virtual Visits (Telemedicine).  Patients are able to view lab/test results, encounter notes, upcoming appointments, etc.  Non-urgent messages can be sent to your provider as well.   To learn more about what you can do with MyChart, go to [email or web address].    Your next appointment:   6 week(s) Melina Copa, PA on Wednesday, June 22 @ 2:15 pm.    The format for your next appointment:   In Person  Provider:   You may see Mertie Moores, MD   or one of the following Advanced Practice Providers on your designated Care Team:    Melina Copa, [place]  Other Instructions

## 2021-02-27 LAB — BASIC METABOLIC PANEL
BUN/Creatinine Ratio: 19 (ref 10–24)
BUN: 30 mg/dL (ref 10–36)
CO2: 25 mmol/L (ref 20–29)
Calcium: 9.2 mg/dL (ref 8.6–10.2)
Chloride: 105 mmol/L (ref 96–106)
Creatinine, Ser: 1.59 mg/dL — ABNORMAL HIGH (ref 0.76–1.27)
Glucose: 106 mg/dL — ABNORMAL HIGH (ref 65–99)
Potassium: 4 mmol/L (ref 3.5–5.2)
Sodium: 143 mmol/L (ref 134–144)
eGFR: 40 mL/min/{1.73_m2} — ABNORMAL LOW (ref >59)

## 2021-02-27 LAB — CBC
Hematocrit: 37 % — ABNORMAL LOW (ref 37.5–51.0)
Hemoglobin: 12.1 g/dL — ABNORMAL LOW (ref 13.0–17.7)
MCH: 30 pg (ref 26.6–33.0)
MCHC: 32.7 g/dL (ref 31.5–35.7)
MCV: 92 fL (ref 79–97)
Platelets: 158 10*3/uL (ref 150–450)
RBC: 4.03 x10E6/uL — ABNORMAL LOW (ref 4.14–5.80)
RDW: 12.9 % (ref 11.6–15.4)
WBC: 7 10*3/uL (ref 3.4–10.8)

## 2021-02-27 LAB — PRO B NATRIURETIC PEPTIDE: NT-Pro BNP: 2005 pg/mL — ABNORMAL HIGH (ref 0–486)

## 2021-02-27 LAB — MAGNESIUM: Magnesium: 2.1 mg/dL (ref 1.6–2.3)

## 2021-02-27 LAB — TSH: TSH: 2.45 u[IU]/mL (ref 0.450–4.500)

## 2021-02-28 ENCOUNTER — Telehealth: Payer: Self-pay | Admitting: Physician Assistant

## 2021-02-28 DIAGNOSIS — I10 Essential (primary) hypertension: Secondary | ICD-10-CM

## 2021-02-28 MED ORDER — FUROSEMIDE 20 MG PO TABS: 20.0000 mg | ORAL_TABLET | Freq: Every day | ORAL | 3 refills | Status: DC

## 2021-02-28 MED ORDER — LOSARTAN POTASSIUM 100 MG PO TABS: 100.0000 mg | ORAL_TABLET | Freq: Every day | ORAL | 3 refills | Status: DC

## 2021-02-28 NOTE — Telephone Encounter (Signed)
Follow up:    Pt is calling back about his lab results. He was called on 02-27-21 and talked to wife. His wife said he wouldc call back.

## 2021-02-28 NOTE — Telephone Encounter (Signed)
RN returned patient regarding lab results and new medication orders. Patient verbalized understanding. RN advised patient of diet recommendations as well as low sodium foods to consume. Patient scheduled for 5/27 for lab appointment to repeat BMET. Losartan-HCTZ discontinued. Losartan and lasix sent to pharmacy. Patient verbalized understanding and instructed to call the office with any questions or concerns.

## 2021-03-01 DIAGNOSIS — R42 Dizziness and giddiness: Secondary | ICD-10-CM

## 2021-03-01 DIAGNOSIS — I4819 Other persistent atrial fibrillation: Secondary | ICD-10-CM

## 2021-03-01 DIAGNOSIS — I493 Ventricular premature depolarization: Secondary | ICD-10-CM

## 2021-03-08 ENCOUNTER — Other Ambulatory Visit: Payer: Medicare Other | Admitting: *Deleted

## 2021-03-08 ENCOUNTER — Other Ambulatory Visit: Payer: Self-pay

## 2021-03-08 DIAGNOSIS — I10 Essential (primary) hypertension: Secondary | ICD-10-CM

## 2021-03-09 LAB — BASIC METABOLIC PANEL
BUN/Creatinine Ratio: 15 (ref 10–24)
BUN: 26 mg/dL (ref 10–36)
CO2: 24 mmol/L (ref 20–29)
Calcium: 9.1 mg/dL (ref 8.6–10.2)
Chloride: 103 mmol/L (ref 96–106)
Creatinine, Ser: 1.74 mg/dL — ABNORMAL HIGH (ref 0.76–1.27)
Glucose: 97 mg/dL (ref 65–99)
Potassium: 4.2 mmol/L (ref 3.5–5.2)
Sodium: 145 mmol/L — ABNORMAL HIGH (ref 134–144)
eGFR: 36 mL/min/{1.73_m2} — ABNORMAL LOW (ref >59)

## 2021-03-13 ENCOUNTER — Telehealth: Payer: Self-pay | Admitting: Cardiovascular Disease

## 2021-03-13 NOTE — Telephone Encounter (Signed)
[place] from [place] calling with abnormal zio patch results.

## 2021-03-13 NOTE — Telephone Encounter (Signed)
Spoke with Derek Schneider with [employer].  Wore for 6 days with continuous afib.  HR 31-138  Triggered 11 times for afib.  Report completed.  Pt on Xarelto.

## 2021-03-26 ENCOUNTER — Ambulatory Visit (HOSPITAL_COMMUNITY): Payer: Medicare Other | Attending: Internal Medicine

## 2021-03-26 ENCOUNTER — Other Ambulatory Visit: Payer: Self-pay | Admitting: Cardiovascular Disease

## 2021-03-26 ENCOUNTER — Other Ambulatory Visit: Payer: Self-pay

## 2021-03-26 DIAGNOSIS — R42 Dizziness and giddiness: Secondary | ICD-10-CM | POA: Insufficient documentation

## 2021-03-26 DIAGNOSIS — R0602 Shortness of breath: Secondary | ICD-10-CM | POA: Diagnosis not present

## 2021-03-26 DIAGNOSIS — I10 Essential (primary) hypertension: Secondary | ICD-10-CM | POA: Diagnosis present

## 2021-03-26 DIAGNOSIS — I340 Nonrheumatic mitral (valve) insufficiency: Secondary | ICD-10-CM | POA: Insufficient documentation

## 2021-03-26 DIAGNOSIS — I351 Nonrheumatic aortic (valve) insufficiency: Secondary | ICD-10-CM | POA: Diagnosis present

## 2021-03-26 DIAGNOSIS — I493 Ventricular premature depolarization: Secondary | ICD-10-CM | POA: Insufficient documentation

## 2021-03-26 DIAGNOSIS — I2510 Atherosclerotic heart disease of native coronary artery without angina pectoris: Secondary | ICD-10-CM | POA: Insufficient documentation

## 2021-03-26 DIAGNOSIS — I4819 Other persistent atrial fibrillation: Secondary | ICD-10-CM | POA: Insufficient documentation

## 2021-03-26 LAB — ECHOCARDIOGRAM COMPLETE
Area-P 1/2: 1.81 cm2
MV M vel: 5.34 m/s
MV Peak grad: 114.1 mmHg
P 1/2 time: 485 msec
S' Lateral: 3.1 cm

## 2021-04-02 ENCOUNTER — Encounter: Payer: Self-pay | Admitting: Physician Assistant

## 2021-04-02 NOTE — Progress Notes (Addendum)
Cardiology Office Note    Date:  04/03/2021   ID:  Derek Schneider, DOB May 02, 1926, MRN [id number]  PCP:  Derek Rasmussen, MD  Cardiologist:  Derek Moores, MD  Electrophysiologist:  None   Chief Complaint: f/u SOB, dizziness  History of Present Illness:   Derek Schneider is a 87 y.o. male with history of persistent atrial fibrillation, mild AI, mild-moderate MR, suspected chronic diastolic CHF, mild CAD by cath 2009, HTN, HLD, esophageal spasm, CKD stage IIIb, acoustic neuroma, hiatal hernia,who presents for evaluation of SOB. He was a previous pt of Dr. Wynonia Schneider but has since followed with Dr. Acie Schneider. Remote cath 2009 showed diffuse diesase of the LAD and diagonal branches without obstruction, 30% prox LAD, otherwise scattered irregularities. His atrial fib appears to have been managed with strategy of rate control and anticoagulation over the years. Last echo 10/2018 with mild LVH, EF 60-65%, mild AI, moderate MR, mild-moderate LAE. Historically he is very active and walks regularly. I saw him recently 02/2021 for evaluation of SOB/orthopnea and dizziness. He reported episodic HR in the 40s at times. BNP was elevated so we added low dose Lasix.  2D echo 03/26/21 outlined below with EF 70-75%, mildly elevated PASP, severe LAE, mild-moderate MR, borderline dilation of ascending aorta. Event monitor interpreted by Dr. Acie Schneider showed chronic atrial fib, rate range 31-138bpm, average HR 62bpm, occasional PVCs. By my review there were a few episodes of slow ventricular response during wakeful hours with transient HR in the 30s-40s.   He returns to clinic today feeling substantially better from a breathing standpoint. The Lasix helped "night and day." SOB has markedly improved. He has not had any recent angina. He does report still having rare episodes of a "sinking feeling." He had one yesterday and HR was in the 40s. He did not feel any palpitations or have syncope with this. The feeling was very  fleeting. Has occasional pedal/ankle edema - tends to be L>R when it occurs which is chronic.   Labwork independently reviewed: 03/08/21 K 4.2, Cr 1.74, Na 145 02/26/21 TSH wnl, Hgb 12.1, PBNP 2k, Mg 2.1  Past Medical History:  Diagnosis Date   Acoustic neuroma ([place])    Right ear   Aortic insufficiency    Asthma    Bronchitis, not specified as acute or chronic    Chronic diastolic CHF (congestive heart failure) ([place])    Diverticulosis of colon (without mention of hemorrhage)    Hiatal hernia    Internal hemorrhoids without mention of complication    Mild CAD    Mitral regurgitation    Persistent atrial fibrillation (H[facility])    Personal history of colonic polyps 01/12/2008   TUBULOVILLOUS ADENOMA   PVC (premature ventricular contraction)    Stricture and stenosis of esophagus    Unspecified essential hypertension    Unspecified gastritis and gastroduodenitis without mention of hemorrhage    Wears contact lenses    right eye    Past Surgical History:  Procedure Laterality Date   CARDIAC CATHETERIZATION     ESOPHAGOGASTRODUODENOSCOPY (EGD) WITH PROPOFOL N/A 09/16/2019   Procedure: ESOPHAGOGASTRODUODENOSCOPY (EGD) WITH PROPOFOL;  Surgeon: Carol Ada, MD;  Location: [facility];  Service: Endoscopy;  Laterality: N/A;   KNEE SURGERY  as a child   left patella removal as a child   SAVORY DILATION N/A 09/16/2019   Procedure: SAVORY DILATION;  Surgeon: Carol Ada, MD;  Location: [facility];  Service: Endoscopy;  Laterality: N/A;   TESTICLAR CYST EXCISION  TONSILLECTOMY      Current Medications: Current Meds  Medication Sig   BREO ELLIPTA 200-25 MCG/INH AEPB Inhale 1 puff into the lungs daily.   cholecalciferol (VITAMIN D) 1000 UNITS tablet Take 1,000 Units by mouth daily.   DEXILANT 60 MG capsule TAKE 1 CAPSULE BY MOUTH ONCE DAILY BEFORE BREAKFAST   diltiazem (CARDIZEM CD) 120 MG 24 hr capsule Take 1 capsule (120 mg total) by mouth daily.   diltiazem (CARDIZEM) 30 MG  tablet TAKE 1 TABLET BY MOUTH AT BEDTIME AS NEEDED (FAST  HEART  RATE/ATRIAL  FIB)   finasteride (PROSCAR) 5 MG tablet Take 5 mg by mouth daily.    furosemide (LASIX) 20 MG tablet Take 1 tablet (20 mg total) by mouth daily.   losartan (COZAAR) 100 MG tablet Take 1 tablet (100 mg total) by mouth daily.   nebivolol (BYSTOLIC) 2.5 MG tablet Take 1 tablet (2.5 mg total) by mouth daily.   Tamsulosin HCl (FLOMAX) 0.4 MG CAPS Take 0.4 mg by mouth every evening.    triamcinolone (KENALOG) 0.1 % Apply 1 application topically 2 (two) times daily as needed.   XARELTO 15 MG TABS tablet TAKE 1 TABLET BY MOUTH ONCE DAILY WITH SUPPER   nebivolol (BYSTOLIC) 5 MG tablet Take 1 tablet by mouth once daily      Allergies:   Patient has no known allergies.   Social History   Socioeconomic History   Marital status: Married    Spouse name: Not on file   Number of children: 5   Years of education: Not on file   Highest education level: Not on file  Occupational History   Occupation: Self employed    Employer: RETIRED  Tobacco Use   Smoking status: Never   Smokeless tobacco: Never  Vaping Use   Vaping Use: Never used  Substance and Sexual Activity   Alcohol use: No   Drug use: No   Sexual activity: Not on file  Other Topics Concern   Not on file  Social History Narrative   Not on file   Social Determinants of Health   Financial Resource Strain: Not on file  Food Insecurity: Not on file  Transportation Needs: Not on file  Physical Activity: Not on file  Stress: Not on file  Social Connections: Not on file     Family History:  The patient's family history includes Cancer in his brother; Heart disease in his father; Kidney disease in his father; Lung cancer in his brother.  ROS:   Please see the history of present illness.  All other systems are reviewed and otherwise negative.    EKGs/Labs/Other Studies Reviewed:    Studies reviewed are outlined and summarized above. Reports included  below if pertinent.  2D Echo 03/26/21   1. Left ventricular ejection fraction, by estimation, is 70 to 75%. The  left ventricle has hyperdynamic function. The left ventricle has no  regional wall motion abnormalities. Left ventricular diastolic function  could not be evaluated.   2. Right ventricular systolic function is normal. The right ventricular  size is normal. There is mildly elevated pulmonary artery systolic  pressure. The estimated right ventricular systolic pressure is 06.0 mmHg.   3. Left atrial size was severely dilated.   4. Right atrial size was mildly dilated.   5. The mitral valve is abnormal. Mild to moderate mitral valve  regurgitation.   6. The aortic valve is tricuspid. Aortic valve regurgitation is mild.  Mild to moderate aortic valve sclerosis/calcification  is present, without  any evidence of aortic stenosis. Aortic regurgitation PHT measures 485  msec.   7. Aortic dilatation noted. There is borderline dilatation of the  ascending aorta, measuring 38 mm.   8. The inferior vena cava is normal in size with <50% respiratory  variability, suggesting right atrial pressure of 8 mmHg.   Comparison(s): No prior Echocardiogram.   Monitor Read 03/2021  Chronic atrial fib Occasional PVCs No significant arrhythmias     Patch Wear Time:  6 days and 1 hours (2022-05-20T12:03:45-0400 to 2022-05-26T13:24:12-398)   Atrial Fibrillation occurred continuously (100% burden), ranging from 31-138 bpm (avg of 62 bpm). Isolated VEs were frequent (5.4%, [id number]), VE Couplets were rare (<1.0%, 550), and VE Triplets were rare (<1.0%, 92). Ventricular Bigeminy and Trigeminy were  present. MD notification criteria for Slow Atrial Fibrillation met - report posted prior to notification per account request (RT).  2D Echo 10/2018 - Left ventricle: The cavity size was normal. Wall thickness was    increased in a pattern of mild LVH. There was focal basal    hypertrophy. Systolic function  was normal. The estimated ejection    fraction was in the range of 60% to 65%. Wall motion was normal;    there were no regional wall motion abnormalities.  - Aortic valve: There was mild regurgitation.  - Mitral valve: There was moderate regurgitation.  - Left atrium: The atrium was mildly to moderately dilated.     2009 Cath DATE OF DISCHARGE:  07/03/2008                             CARDIAC CATHETERIZATION    HISTORY:  The patient has a history of mild coronary artery disease with  last catheterization done in December 2000.  He had multiple visits to  the emergency room and to the office this year complaining of chest pain  with some radiation into the left arm.  He has a previous history of  esophageal spasm.  He has been having continued shoulder pain.    PROCEDURE:  Left heart catheterization with coronary angiograms and left  ventriculogram.    PROCEDURE IN DETAIL:  The procedure was done in the outpatient  diagnostic laboratory.  The right femoral artery was entered using a  single anterior needle wall stick and a 4-French sheath was placed.  The  procedure was done with a 4-French catheters and a 30 mL ventriculogram  was performed.  He tolerated the procedure well.    HEMODYNAMIC DATA:  Left ventricular 140/12-20, aorta postcontrast  148/61.  Coronary arteries arise and distribute normally.  There is very  mild calcification involving the coronary arteries.  Left main coronary  artery with mild irregularity.  Left anterior descending:  The vessel  was mildly calcified proximally.  A series of 3 diagonal branches arise  off the artery.  There is diffuse disease involving the LAD and the  diagonal branches that is not severely obstructive.  The optional  diagonal or intermediate branch arises, which could be considered the  first diagonal branch.  There is an eccentric 30% proximal LAD stenosis  after the origin of this diagonal branch.  The circumflex coronary  artery:   Contains no significant disease.  Right coronary artery:  Dominant vessel with scattered irregularities supplies the posterior  descending and a series of several small posterolateral branches.    IMPRESSION:  1. Mild coronary artery disease, predominantly involving the left  anterior descending coronary artery with scattered irregularities      elsewhere.  2. Normal left ventricular function.      Ezzard Standing, M.D.  Electronically Signed    EKG:  EKG is not ordered today  Recent Labs: 02/26/2021: Hemoglobin 12.1; Magnesium 2.1; NT-Pro BNP 2,005; Platelets 158; TSH 2.450 03/08/2021: BUN 26; Creatinine, Ser 1.74; Potassium 4.2; Sodium 145  Recent Lipid Panel No results found for: CHOL, TRIG, HDL, CHOLHDL, VLDL, LDLCALC, LDLDIRECT  PHYSICAL EXAM:    VS:  BP 120/60 (BP Location: Left Arm, Patient Position: Sitting, Cuff Size: Normal)   Pulse (!) 57   Ht 5' 7" (1.702 m)   Wt 173 lb (78.5 kg)   SpO2 98%   BMI 27.10 kg/m   BMI: Body mass index is 27.1 kg/m.  GEN: Well nourished, well developed male in no acute distress HEENT: normocephalic, atraumatic Neck: no JVD, carotid bruits, or masses Cardiac: irregular rhythm, normal rate, no murmurs, rubs, or gallops, no edema  Respiratory:  clear to auscultation bilaterally, normal work of breathing GI: soft, nontender, nondistended, + BS MS: no deformity or atrophy Skin: warm and dry, no rash Neuro:  Alert and Oriented x 3, Strength and sensation are intact, follows commands Psych: euthymic mood, full affect  Wt Readings from Last 3 Encounters:  04/03/21 173 lb (78.5 kg)  02/26/21 175 lb 6.4 oz (79.6 kg)  11/02/20 171 lb 12.8 oz (77.9 kg)     ASSESSMENT & PLAN:   1. Chronic diastolic CHF - his shortness of breath is "substantially better" with the Lasix and he is feeling back to baseline. He is really pleased how he is feeling therefore would continue present dose of Lasix.  2. CKD stage IIIb - baseline Cr appears  1.5s-1.8. Slight increase of creatinine noted following initiation of Lasix, but this was within prior range back to 2020. He has not seen nephrology in the past but expresses a desire to get established. We will place referral.  3. Persistent atrial fib with intermittent SVR, also occasional PVCs - still with intermittent episodes of dizziness. Based on his event monitor I suspect this may be related to intermittent bradycardia. He takes diltiazem 299BZ daily and Bystolic 55m daily. He also has history of intermittent esophageal spasm so seems prudent to continue the diltiazem as we are able from a quality-of-life standpoint. Will decrease Bystolic to 21.6RCdaily, new rx provided. I told him in the event he has any additional recurrent episodes to stop the Bystolic completely and call [place] He is also noted to have episodic PVCs on his monitor so will continue beta blocker if we are able, but may need to stop if HR dictates the need. He remains on renally dosed Xarelto without bleeding.   4. Mild CAD - stable. Not on ASA due to concomitant Xarelto. Our office has not historically followed lipids - would defer to PCP given age. No recent angina. LV function within normal limits.  5. Valvular heart disease with mild AI, mild-moderate MR, also notation of borderline dilated ascending aorta - continue to follow clinically. Will defer to primary cardiologist in follow-up to determine timing of subsequent surveillance if appropriate.  Disposition: F/u with Dr. NAcie Fredricksonin 6 months.   Medication Adjustments/Labs and Tests Ordered: Current medicines are reviewed at length with the patient today.  Concerns regarding medicines are outlined above. Medication changes, Labs and Tests ordered today are summarized above and listed in the Patient Instructions accessible in Encounters.   Signed,  Charlie Pitter, PA-C  04/03/2021 2:34 PM    [facility] [place] [address], [place], [place]  [address] Phone:  [phone]; Fax: [phone]

## 2021-04-03 ENCOUNTER — Other Ambulatory Visit: Payer: Self-pay

## 2021-04-03 ENCOUNTER — Ambulatory Visit (INDEPENDENT_AMBULATORY_CARE_PROVIDER_SITE_OTHER): Payer: Medicare Other | Admitting: Physician Assistant

## 2021-04-03 ENCOUNTER — Encounter: Payer: Self-pay | Admitting: Physician Assistant

## 2021-04-03 VITALS — BP 120/60 | HR 57 | Ht 67.0 in | Wt 173.0 lb

## 2021-04-03 DIAGNOSIS — I4819 Other persistent atrial fibrillation: Secondary | ICD-10-CM | POA: Diagnosis not present

## 2021-04-03 DIAGNOSIS — I5032 Chronic diastolic (congestive) heart failure: Secondary | ICD-10-CM | POA: Diagnosis not present

## 2021-04-03 DIAGNOSIS — I2510 Atherosclerotic heart disease of native coronary artery without angina pectoris: Secondary | ICD-10-CM | POA: Diagnosis not present

## 2021-04-03 DIAGNOSIS — I340 Nonrheumatic mitral (valve) insufficiency: Secondary | ICD-10-CM

## 2021-04-03 DIAGNOSIS — I351 Nonrheumatic aortic (valve) insufficiency: Secondary | ICD-10-CM | POA: Diagnosis not present

## 2021-04-03 DIAGNOSIS — N1832 Chronic kidney disease, stage 3b: Secondary | ICD-10-CM

## 2021-04-03 MED ORDER — NEBIVOLOL HCL 2.5 MG PO TABS: 2.5000 mg | ORAL_TABLET | Freq: Every day | ORAL | 3 refills | Status: DC

## 2021-04-03 NOTE — Patient Instructions (Addendum)
Medication Instructions:  Your physician has recommended you make the following change in your medication:   REDUCE the Nebivolol to 2.5 mg taking 1 tablet daily. You may take 1/2 of the 5 mg tablets you have and I have sent in a new prescription for the 2.5 mg   *If you need a refill on your cardiac medications before your next appointment, please call your pharmacy*   Lab Work: None ordered  If you have labs (blood work) drawn today and your tests are completely normal, you will receive your results only by: [place] (if you have MyChart) OR A paper copy in the mail If you have any lab test that is abnormal or we need to change your treatment, we will call you to review the results.   Testing/Procedures: None ordered  You have been referred to [facility].  They will contact you with an appointment.  Follow-Up: At [facility], you and your health needs are our priority.  As part of our continuing mission to provide you with exceptional heart care, we have created designated Provider Care Teams.  These Care Teams include your primary Cardiologist (physician) and Advanced Practice Providers (APPs -  Physician Assistants and Nurse Practitioners) who all work together to provide you with the care you need, when you need it.  We recommend signing up for the patient portal called "MyChart".  Sign up information is provided on this After Visit Summary.  MyChart is used to connect with patients for Virtual Visits (Telemedicine).  Patients are able to view lab/test results, encounter notes, upcoming appointments, etc.  Non-urgent messages can be sent to your provider as well.   To learn more about what you can do with MyChart, go to [email or web address].    Your next appointment:   6 month(s)  The format for your next appointment:   In Person  Provider:   You may see Mertie Moores, MD or one of the following Advanced Practice Providers on your designated Care Team:    Richardson Dopp, PA-C [place], [place]   Other Instructions  Please monitor your blood pressure on the new medication.  If you notice your top number is greater than 140 consistently, call our office [phone]

## 2021-04-04 ENCOUNTER — Other Ambulatory Visit: Payer: Self-pay | Admitting: Cardiovascular Disease

## 2021-04-05 NOTE — Telephone Encounter (Signed)
Prescription refill request for Xarelto received.  Indication:afib Last office visit:04/03/21 Weight:78.5 Age:87 Scr:1.74 CrCl:45

## 2021-04-09 ENCOUNTER — Telehealth: Payer: Self-pay | Admitting: Cardiovascular Disease

## 2021-04-09 DIAGNOSIS — I10 Essential (primary) hypertension: Secondary | ICD-10-CM

## 2021-04-09 DIAGNOSIS — R030 Elevated blood-pressure reading, without diagnosis of hypertension: Secondary | ICD-10-CM

## 2021-04-09 NOTE — Telephone Encounter (Signed)
Returned call to pt.  He has been made aware that he should not increase the Bystolic back to 5 mg as it was reduced due to his heart rate being low on the monitor.  Pt agrees about his bp cuff may not be accurate and he has ordered a new one.  He states if he wakes up at 3 a.m he checks it, if he wakes up at 5 a.m, he checks it.  Have ordered a 24 hour bp monitor for pt to wear and pt aware that someone will call him to arrange him coming in and getting it put on.  He said he takes his Losartan in the a.m.   Pt states his foot is just swollen, not discolored or painful. He was advised to try the extra dose of Lasix today as already suggested, and if no relief, to follow up with his PCP.  Pt didn't have any day time bp's to give at this time and he would have to call back with them.

## 2021-04-09 NOTE — Telephone Encounter (Signed)
Pt calling today to report elevated BP's since changing to bystolic. He has increased his dose to 5mg  qd for the past 3 days but has not seen any improvement. He wakes around 3am and measures his BP and it is usually elevated in the high 190's to low 947'X systolic. He takes his BP medications before bed. His second complaint is a swollen left foot, ankle, and leg. He states it has progressively gotten worse for the past 3 days. He denies SOB, weight gain or chest pain.   Pt was advised to take an additional dose of lasix today and keep his leg elevated with compression stockings, mindful sodium and fluid intake.   I will forward to Melina Copa, PA and Dr. Acie Fredrickson for review and additional recommendation for fluid and BP management.

## 2021-04-09 NOTE — Telephone Encounter (Signed)
Spoke with pt and reviewed medication timing changes and frequency of lasix. He denies any pain, temp changes, numbness or tingling in his left lower extremity. We discussed how the lasix should help decrease his swelling. If the swelling worsens or does not improve, he should contact the office back. He also is aware of the blood pressure monitor ordered. He also has ordered a new cuff and it will arrive tomorrow. We also discussed timing and frequency of blood pressure checks being a few hours after he takes his medications and preferably the same times each day so measurements can be compared. He verbalized understanding and had no additional questions at this time.

## 2021-04-09 NOTE — Telephone Encounter (Signed)
Pt c/o BP issue: STAT if pt c/o blurred vision, one-sided weakness or slurred speech  1. What are your last 5 BP readings? 202/99; 188/99  2. Are you having any other symptoms (ex. Dizziness, headache, blurred vision, passed out)? no  3. What is your BP issue? Left foot is swollen and feels like no circulation is going to his foot

## 2021-04-09 NOTE — Telephone Encounter (Signed)
Follow up:     Patient returning a call back stating that he was waiting to see if he could come in. Lease advise.

## 2021-04-09 NOTE — Telephone Encounter (Addendum)
Patient previously reported intermittently elevated BP at home in the past but his BP is frequently totally normal in our office during the daytime therefore I am not convinced of the validity of his cuff. Would recommend trying to obtain a new one to verify. Perhaps checking at 3am is capturing some sort of stress response upon awakening so need to look at the bigger picture during the day as well. What are his daytime BP readings doing?  Agree with extra Lasix PRN edema (can take extra up to 3x/week for now but let us know if he is finding he is regularly having to take that extra dose).   Bystolic was decreased because of bradycardia and HR in the 30s-40s on his monitor so I would not advise going back to the 5mg  dose. Try moving back his losartan to early on in the day so that he has steady state in his system by the time the night falls. Let's also set him up for a 24 hour ambulatory BP cuff to see what pressures are doing.  If there is any pain/discoloration or temperature decrease in his left foot, needs to seek care today more urgently for a condition affecting his circulation. But if he feels this is a worsening of his tendency to swell in that foot, follow closely with extra Lasix and seek care if it does not resolve.  Derek Schneider

## 2021-04-23 ENCOUNTER — Telehealth: Payer: Self-pay | Admitting: *Deleted

## 2021-04-23 NOTE — Telephone Encounter (Signed)
Please call Larina Lieurance in monitors to schedule your 24 Hour Ambulatory Blood Pressure monitor.  We have openings this Thursday, 04/25/21 or the following Monday , 04/29/21.

## 2021-04-25 ENCOUNTER — Encounter: Payer: Self-pay | Admitting: *Deleted

## 2021-04-25 NOTE — Telephone Encounter (Signed)
Patient sent a message via Appointment Requests that landed in my inbox stating he no longer has been seeing wild BP fluctuations, the issue has resolved. I sent a message back to him asking him to send [place] a list of readings but I think we can hold off on the 24 hour BP cuff. I also asked him to confirm he remains at the 2.5mg  dose of nebivolol since we don't want him on a higher dose due to slow HR. Interestingly I am not sure how he sent this msg as I don't see any MyChart messages showing up on my platform so please touch base with him to make sure he got my reply. Please assist in cancelling the 24hr BP cuff for now. Thx

## 2021-04-25 NOTE — Telephone Encounter (Signed)
Derek Schneider,  Pt sent a Mychart message requesting someone to call them at [phone] to sch this appt?     Appointment Request From: Derek Schneider   With Provider: Charlie Pitter, PA-C [place]   Preferred Date Range: 04/26/2021 - 04/26/2021   Preferred Times: Any Time   Reason for visit: Office Visit   Comments: Dayna requested I have a cuff 24 hrs for my blood pressure readings. I have trying to make a connection with [facility]

## 2021-04-25 NOTE — Telephone Encounter (Signed)
Sent pt a mychart message regarding phone note below.

## 2021-04-29 NOTE — Telephone Encounter (Signed)
Left a detailed message for pt to call back regarding message below.

## 2021-05-01 NOTE — Telephone Encounter (Signed)
Call placed to pt regarding phone message below. Pt does confirm that he received Dayna Dunn, PA-C's response back on mychart regarding the BP monitor being cancelled. Pt also confirms that he is on the 2.5 Nebivolol and he is doing just fine.

## 2021-05-21 ENCOUNTER — Other Ambulatory Visit: Payer: Self-pay | Admitting: Physician Assistant

## 2021-05-24 ENCOUNTER — Other Ambulatory Visit: Payer: Self-pay | Admitting: Nephrology

## 2021-05-24 DIAGNOSIS — N1832 Chronic kidney disease, stage 3b: Secondary | ICD-10-CM

## 2021-05-29 ENCOUNTER — Other Ambulatory Visit: Payer: Medicare Other

## 2021-05-31 ENCOUNTER — Ambulatory Visit (INDEPENDENT_AMBULATORY_CARE_PROVIDER_SITE_OTHER): Payer: Medicare Other

## 2021-05-31 ENCOUNTER — Other Ambulatory Visit: Payer: Self-pay

## 2021-05-31 ENCOUNTER — Ambulatory Visit (INDEPENDENT_AMBULATORY_CARE_PROVIDER_SITE_OTHER): Payer: Medicare Other | Admitting: Podiatry

## 2021-05-31 DIAGNOSIS — L089 Local infection of the skin and subcutaneous tissue, unspecified: Secondary | ICD-10-CM

## 2021-05-31 DIAGNOSIS — L539 Erythematous condition, unspecified: Secondary | ICD-10-CM

## 2021-05-31 MED ORDER — DOXYCYCLINE HYCLATE 100 MG PO TABS: 100.0000 mg | ORAL_TABLET | Freq: Two times a day (BID) | ORAL | 0 refills | Status: DC

## 2021-06-03 ENCOUNTER — Ambulatory Visit
Admission: RE | Admit: 2021-06-03 | Discharge: 2021-06-03 | Disposition: A | Discharge status: Home / Self Care | Payer: Medicare Other | Source: Ambulatory Visit | Attending: Nephrology | Admitting: Nephrology

## 2021-06-03 ENCOUNTER — Other Ambulatory Visit: Payer: Self-pay

## 2021-06-03 DIAGNOSIS — N1832 Chronic kidney disease, stage 3b: Secondary | ICD-10-CM

## 2021-06-05 NOTE — Progress Notes (Signed)
Subjective:  Patient ID: Derek Schneider, male    DOB: Sep 21, 1926,  MRN: [id number]  Chief Complaint  Patient presents with   Foot Pain    2nd toe red, swollen and painful.     87 y.o. male presents with the above complaint.  Patient presents with complaint of left second digit toe abrasion.  Patient states is red hot tender swollen.  The concern for infection.  He came out of nowhere has progressed to gotten worse.  They wanted to get it evaluated.  Have not seen anyone else prior to seeing me.  That a similar issue on the right side which was helped with antibiotics.  They would like to get some antibiotics and Oddono.  He denies any other acute complaints   Review of Systems: Negative except as noted in the HPI. Denies N/V/F/Ch.  Past Medical History:  Diagnosis Date   Acoustic neuroma ([place])    Right ear   Aortic insufficiency    Asthma    Bronchitis, not specified as acute or chronic    Chronic diastolic CHF (congestive heart failure) (HCC)    Diverticulosis of colon (without mention of hemorrhage)    Hiatal hernia    Internal hemorrhoids without mention of complication    Mild CAD    Mitral regurgitation    Persistent atrial fibrillation (HCC)    Personal history of colonic polyps 01/12/2008   TUBULOVILLOUS ADENOMA   PVC (premature ventricular contraction)    Stricture and stenosis of esophagus    Unspecified essential hypertension    Unspecified gastritis and gastroduodenitis without mention of hemorrhage    Wears contact lenses    right eye    Current Outpatient Medications:    doxycycline (VIBRA-TABS) 100 MG tablet, Take 1 tablet (100 mg total) by mouth 2 (two) times daily., Disp: 20 tablet, Rfl: 0   BREO ELLIPTA 200-25 MCG/INH AEPB, Inhale 1 puff into the lungs daily., Disp: , Rfl:    cholecalciferol (VITAMIN D) 1000 UNITS tablet, Take 1,000 Units by mouth daily., Disp: , Rfl:    DEXILANT 60 MG capsule, TAKE 1 CAPSULE BY MOUTH ONCE DAILY BEFORE BREAKFAST, Disp: 30  capsule, Rfl: 0   diltiazem (CARDIZEM CD) 120 MG 24 hr capsule, Take 1 capsule (120 mg total) by mouth daily., Disp: 90 capsule, Rfl: 2   diltiazem (CARDIZEM) 30 MG tablet, TAKE 1 TABLET BY MOUTH AT BEDTIME AS NEEDED (FAST  HEART  RATE/ATRIAL  FIB), Disp: 30 tablet, Rfl: 11   finasteride (PROSCAR) 5 MG tablet, Take 5 mg by mouth daily. , Disp: , Rfl:    furosemide (LASIX) 20 MG tablet, Take 1 tablet (20 mg total) by mouth daily., Disp: 30 tablet, Rfl: 3   losartan (COZAAR) 100 MG tablet, Take 1 tablet by mouth once daily, Disp: 30 tablet, Rfl: 0   nebivolol (BYSTOLIC) 2.5 MG tablet, Take 1 tablet (2.5 mg total) by mouth daily., Disp: 90 tablet, Rfl: 3   Rivaroxaban (XARELTO) 15 MG TABS tablet, TAKE 1 TABLET BY MOUTH ONCE DAILY WITH SUPPER, Disp: 90 tablet, Rfl: 1   Tamsulosin HCl (FLOMAX) 0.4 MG CAPS, Take 0.4 mg by mouth every evening. , Disp: , Rfl:    triamcinolone (KENALOG) 0.1 %, Apply 1 application topically 2 (two) times daily as needed., Disp: , Rfl:   Social History   Tobacco Use  Smoking Status Never  Smokeless Tobacco Never    No Known Allergies Objective:  There were no vitals filed for this visit. There is  no height or weight on file to calculate BMI. Constitutional Well developed. Well nourished.  Vascular Dorsalis pedis pulses palpable bilaterally. Posterior tibial pulses palpable bilaterally. Capillary refill normal to all digits.  No cyanosis or clubbing noted. Pedal hair growth normal.  Neurologic Normal speech. Oriented to person, place, and time. Epicritic sensation to light touch grossly present bilaterally.  Dermatologic Nails well groomed and normal in appearance. No open wounds. No skin lesions.  Orthopedic: Mild pain on palpation left second digit noted.  Mild redness noted circumferentially around the second distal tip.  No area of fluctuance or abscess noted.  No signs of wound noted.     Radiographs: 3 views of skeletally mature adult left second digit  No fractures noted.  No osteoarthritic changes noted.  Mild osteopenia noted generally.  Pes planovalgus foot structure noted.  Posterior heel spurring noted Assessment:   1. Toe infection   2. Erythema    Plan:  Patient was evaluated and treated and all questions answered.  Left second digit abrasion with underlying mild erythema -I explained the patient the etiology of abrasion and various treatment options were discussed.  Given that this could likely be due to shoe rubbing on top of her toe that could have led to abrasion and infection.  I discussed with him the importance of shoe gear modification in extensive detail.  Also discussed toe protector as well.  He will also benefit from doxycycline for 10-day course.  I instructed to complete the course.  He states understanding will do so immediately.\   No follow-ups on file.

## 2021-06-20 ENCOUNTER — Other Ambulatory Visit: Payer: Self-pay | Admitting: Physician Assistant

## 2021-07-05 ENCOUNTER — Other Ambulatory Visit: Payer: Self-pay | Admitting: Family Medicine

## 2021-07-05 DIAGNOSIS — R1011 Right upper quadrant pain: Secondary | ICD-10-CM

## 2021-07-12 ENCOUNTER — Ambulatory Visit
Admission: RE | Admit: 2021-07-12 | Discharge: 2021-07-12 | Disposition: A | Discharge status: Home / Self Care | Payer: Medicare Other | Source: Ambulatory Visit | Attending: Family Medicine | Admitting: Family Medicine

## 2021-07-12 ENCOUNTER — Other Ambulatory Visit: Payer: Self-pay

## 2021-07-12 DIAGNOSIS — R1011 Right upper quadrant pain: Secondary | ICD-10-CM

## 2021-07-20 ENCOUNTER — Other Ambulatory Visit: Payer: Self-pay | Admitting: Physician Assistant

## 2021-08-15 ENCOUNTER — Other Ambulatory Visit: Payer: Self-pay | Admitting: Physician Assistant

## 2021-08-15 DIAGNOSIS — M545 Low back pain, unspecified: Secondary | ICD-10-CM

## 2021-09-03 ENCOUNTER — Other Ambulatory Visit: Payer: Self-pay

## 2021-09-03 ENCOUNTER — Ambulatory Visit
Admission: RE | Admit: 2021-09-03 | Discharge: 2021-09-03 | Disposition: A | Discharge status: Home / Self Care | Payer: Medicare Other | Source: Ambulatory Visit | Attending: Physician Assistant | Admitting: Physician Assistant

## 2021-09-03 DIAGNOSIS — M545 Low back pain, unspecified: Secondary | ICD-10-CM

## 2021-09-09 ENCOUNTER — Inpatient Hospital Stay (HOSPITAL_COMMUNITY)
Admission: EM | Admit: 2021-09-09 | Discharge: 2021-09-15 | DRG: 065 | Disposition: A | Discharge status: Hospice | Payer: Medicare Other | Attending: Internal Medicine | Admitting: Internal Medicine

## 2021-09-09 ENCOUNTER — Other Ambulatory Visit: Payer: Self-pay

## 2021-09-09 ENCOUNTER — Emergency Department (HOSPITAL_COMMUNITY): Payer: Medicare Other

## 2021-09-09 ENCOUNTER — Encounter (HOSPITAL_COMMUNITY): Payer: Self-pay | Admitting: Oncology

## 2021-09-09 DIAGNOSIS — K219 Gastro-esophageal reflux disease without esophagitis: Secondary | ICD-10-CM | POA: Diagnosis present

## 2021-09-09 DIAGNOSIS — I2510 Atherosclerotic heart disease of native coronary artery without angina pectoris: Secondary | ICD-10-CM | POA: Diagnosis present

## 2021-09-09 DIAGNOSIS — I4821 Permanent atrial fibrillation: Secondary | ICD-10-CM | POA: Diagnosis present

## 2021-09-09 DIAGNOSIS — Z7901 Long term (current) use of anticoagulants: Secondary | ICD-10-CM | POA: Diagnosis not present

## 2021-09-09 DIAGNOSIS — J45909 Unspecified asthma, uncomplicated: Secondary | ICD-10-CM | POA: Diagnosis present

## 2021-09-09 DIAGNOSIS — Z8249 Family history of ischemic heart disease and other diseases of the circulatory system: Secondary | ICD-10-CM | POA: Diagnosis not present

## 2021-09-09 DIAGNOSIS — Z7189 Other specified counseling: Secondary | ICD-10-CM | POA: Diagnosis not present

## 2021-09-09 DIAGNOSIS — Z86010 Personal history of colonic polyps: Secondary | ICD-10-CM | POA: Diagnosis not present

## 2021-09-09 DIAGNOSIS — I080 Rheumatic disorders of both mitral and aortic valves: Secondary | ICD-10-CM | POA: Diagnosis present

## 2021-09-09 DIAGNOSIS — G9340 Encephalopathy, unspecified: Secondary | ICD-10-CM | POA: Diagnosis present

## 2021-09-09 DIAGNOSIS — I629 Nontraumatic intracranial hemorrhage, unspecified: Secondary | ICD-10-CM

## 2021-09-09 DIAGNOSIS — R4182 Altered mental status, unspecified: Secondary | ICD-10-CM | POA: Diagnosis not present

## 2021-09-09 DIAGNOSIS — I4811 Longstanding persistent atrial fibrillation: Secondary | ICD-10-CM

## 2021-09-09 DIAGNOSIS — I5032 Chronic diastolic (congestive) heart failure: Secondary | ICD-10-CM | POA: Diagnosis present

## 2021-09-09 DIAGNOSIS — Z20822 Contact with and (suspected) exposure to covid-19: Secondary | ICD-10-CM | POA: Diagnosis present

## 2021-09-09 DIAGNOSIS — Z79899 Other long term (current) drug therapy: Secondary | ICD-10-CM | POA: Diagnosis not present

## 2021-09-09 DIAGNOSIS — I110 Hypertensive heart disease with heart failure: Secondary | ICD-10-CM | POA: Diagnosis present

## 2021-09-09 DIAGNOSIS — R509 Fever, unspecified: Secondary | ICD-10-CM

## 2021-09-09 DIAGNOSIS — I10 Essential (primary) hypertension: Secondary | ICD-10-CM | POA: Diagnosis present

## 2021-09-09 DIAGNOSIS — Z8719 Personal history of other diseases of the digestive system: Secondary | ICD-10-CM

## 2021-09-09 DIAGNOSIS — I615 Nontraumatic intracerebral hemorrhage, intraventricular: Secondary | ICD-10-CM | POA: Diagnosis present

## 2021-09-09 DIAGNOSIS — Z515 Encounter for palliative care: Secondary | ICD-10-CM | POA: Diagnosis not present

## 2021-09-09 DIAGNOSIS — A419 Sepsis, unspecified organism: Secondary | ICD-10-CM

## 2021-09-09 DIAGNOSIS — W19XXXA Unspecified fall, initial encounter: Secondary | ICD-10-CM | POA: Diagnosis present

## 2021-09-09 DIAGNOSIS — Z66 Do not resuscitate: Secondary | ICD-10-CM | POA: Diagnosis present

## 2021-09-09 DIAGNOSIS — I4891 Unspecified atrial fibrillation: Secondary | ICD-10-CM | POA: Diagnosis present

## 2021-09-09 LAB — CBC WITH DIFFERENTIAL/PLATELET
Abs Immature Granulocytes: 0.07 10*3/uL (ref 0.00–0.07)
Basophils Absolute: 0 10*3/uL (ref 0.0–0.1)
Basophils Relative: 0 %
Eosinophils Absolute: 0 10*3/uL (ref 0.0–0.5)
Eosinophils Relative: 0 %
HCT: 39.8 % (ref 39.0–52.0)
Hemoglobin: 13.3 g/dL (ref 13.0–17.0)
Immature Granulocytes: 1 %
Lymphocytes Relative: 5 %
Lymphs Abs: 0.6 10*3/uL — ABNORMAL LOW (ref 0.7–4.0)
MCH: 30.2 pg (ref 26.0–34.0)
MCHC: 33.4 g/dL (ref 30.0–36.0)
MCV: 90.2 fL (ref 80.0–100.0)
Monocytes Absolute: 0.8 10*3/uL (ref 0.1–1.0)
Monocytes Relative: 7 %
Neutro Abs: 10.6 10*3/uL — ABNORMAL HIGH (ref 1.7–7.7)
Neutrophils Relative %: 87 %
Platelets: 184 10*3/uL (ref 150–400)
RBC: 4.41 MIL/uL (ref 4.22–5.81)
RDW: 13.8 % (ref 11.5–15.5)
WBC: 12.1 10*3/uL — ABNORMAL HIGH (ref 4.0–10.5)
nRBC: 0 % (ref 0.0–0.2)

## 2021-09-09 LAB — BLOOD GAS, ARTERIAL
Acid-base deficit: 3.2 mmol/L — ABNORMAL HIGH (ref 0.0–2.0)
Allens test (pass/fail): POSITIVE — AB
Bicarbonate: 20 mmol/L (ref 20.0–28.0)
Drawn by: 22052
FIO2: 100
Mode: POSITIVE
O2 Saturation: 100 %
Patient temperature: 98.6
RATE: 10 resp/min
pCO2 arterial: 31 mmHg — ABNORMAL LOW (ref 32.0–48.0)
pH, Arterial: 7.425 (ref 7.350–7.450)
pO2, Arterial: 332 mmHg — ABNORMAL HIGH (ref 83.0–108.0)

## 2021-09-09 LAB — COMPREHENSIVE METABOLIC PANEL
ALT: 21 U/L (ref 0–44)
AST: 31 U/L (ref 15–41)
Albumin: 3.9 g/dL (ref 3.5–5.0)
Alkaline Phosphatase: 65 U/L (ref 38–126)
Anion gap: 11 (ref 5–15)
BUN: 24 mg/dL — ABNORMAL HIGH (ref 8–23)
CO2: 23 mmol/L (ref 22–32)
Calcium: 8.6 mg/dL — ABNORMAL LOW (ref 8.9–10.3)
Chloride: 101 mmol/L (ref 98–111)
Creatinine, Ser: 1.17 mg/dL (ref 0.61–1.24)
GFR, Estimated: 58 mL/min — ABNORMAL LOW (ref >60)
Glucose, Bld: 120 mg/dL — ABNORMAL HIGH (ref 70–99)
Potassium: 3.8 mmol/L (ref 3.5–5.1)
Sodium: 135 mmol/L (ref 135–145)
Total Bilirubin: 1.8 mg/dL — ABNORMAL HIGH (ref 0.3–1.2)
Total Protein: 7.2 g/dL (ref 6.5–8.1)

## 2021-09-09 LAB — URINALYSIS, ROUTINE W REFLEX MICROSCOPIC
Bilirubin Urine: NEGATIVE
Glucose, UA: NEGATIVE mg/dL
Hgb urine dipstick: NEGATIVE
Ketones, ur: 5 mg/dL — AB
Leukocytes,Ua: NEGATIVE
Nitrite: NEGATIVE
Protein, ur: 100 mg/dL — AB
Specific Gravity, Urine: 1.014 (ref 1.005–1.030)
pH: 8 (ref 5.0–8.0)

## 2021-09-09 LAB — PROTIME-INR
INR: 1.1 (ref 0.8–1.2)
Prothrombin Time: 13.9 seconds (ref 11.4–15.2)

## 2021-09-09 LAB — LACTIC ACID, PLASMA: Lactic Acid, Venous: 1.8 mmol/L (ref 0.5–1.9)

## 2021-09-09 LAB — RESP PANEL BY RT-PCR (FLU A&B, COVID) ARPGX2
Influenza A by PCR: NEGATIVE
Influenza B by PCR: NEGATIVE
SARS Coronavirus 2 by RT PCR: NEGATIVE

## 2021-09-09 LAB — APTT: aPTT: 27 seconds (ref 24–36)

## 2021-09-09 MED ORDER — LORAZEPAM 2 MG/ML IJ SOLN
0.5000 mg | INTRAMUSCULAR | Status: DC | PRN
  Administered 2021-09-10: 0.5 mg via INTRAVENOUS

## 2021-09-09 MED ORDER — VANCOMYCIN HCL 1500 MG/300ML IV SOLN
1500.0000 mg | Freq: Once | INTRAVENOUS | Status: AC
  Administered 2021-09-09: 17:00:00 1500 mg via INTRAVENOUS
  Filled 2021-09-09: qty 300

## 2021-09-09 MED ORDER — VANCOMYCIN HCL IN DEXTROSE 1-5 GM/200ML-% IV SOLN: 1000.0000 mg | Freq: Once | INTRAVENOUS | Status: DC

## 2021-09-09 MED ORDER — LACTATED RINGERS IV SOLN: INTRAVENOUS | Status: DC

## 2021-09-09 MED ORDER — SODIUM CHLORIDE 0.9 % IV SOLN
2.0000 g | Freq: Once | INTRAVENOUS | Status: AC
  Administered 2021-09-09: 17:00:00 2 g via INTRAVENOUS
  Filled 2021-09-09: qty 2

## 2021-09-09 MED ORDER — METRONIDAZOLE 500 MG/100ML IV SOLN
500.0000 mg | Freq: Once | INTRAVENOUS | Status: AC
  Administered 2021-09-09: 18:00:00 500 mg via INTRAVENOUS
  Filled 2021-09-09: qty 100

## 2021-09-09 MED ORDER — LORAZEPAM 2 MG/ML IJ SOLN
0.5000 mg | Freq: Once | INTRAMUSCULAR | Status: AC
  Administered 2021-09-09: 20:00:00 0.5 mg via INTRAVENOUS
  Filled 2021-09-09: qty 1

## 2021-09-09 MED ORDER — LORAZEPAM 2 MG/ML IJ SOLN
1.0000 mg | Freq: Once | INTRAMUSCULAR | Status: AC
  Administered 2021-09-09: 18:00:00 1 mg via INTRAVENOUS
  Filled 2021-09-09: qty 1

## 2021-09-09 MED ORDER — ACETAMINOPHEN 650 MG RE SUPP
650.0000 mg | Freq: Once | RECTAL | Status: AC
  Administered 2021-09-09: 17:00:00 650 mg via RECTAL
  Filled 2021-09-09: qty 1

## 2021-09-09 MED ORDER — LACTATED RINGERS IV BOLUS (SEPSIS)
1000.0000 mL | Freq: Once | INTRAVENOUS | Status: AC
  Administered 2021-09-09: 17:00:00 1000 mL via INTRAVENOUS

## 2021-09-09 MED ORDER — VANCOMYCIN HCL IN DEXTROSE 1-5 GM/200ML-% IV SOLN: 1000.0000 mg | INTRAVENOUS | Status: DC

## 2021-09-09 MED ORDER — SODIUM CHLORIDE 0.9 % IV SOLN
2.0000 g | Freq: Two times a day (BID) | INTRAVENOUS | Status: DC
  Administered 2021-09-10: 2 g via INTRAVENOUS

## 2021-09-09 MED ORDER — HYDRALAZINE HCL 20 MG/ML IJ SOLN
10.0000 mg | INTRAMUSCULAR | Status: DC | PRN
  Administered 2021-09-09 – 2021-09-10 (×2): 10 mg via INTRAVENOUS
  Filled 2021-09-09 (×2): qty 1

## 2021-09-09 NOTE — ED Provider Notes (Addendum)
I provided a substantive portion of the care of this patient.  I personally performed the entirety of the history, exam, and medical decision making for this encounter.  CRITICAL CARE Performed by: Fredia Sorrow Total critical care time: 45 minutes Critical care time was exclusive of separately billable procedures and treating other patients. Critical care was necessary to treat or prevent imminent or life-threatening deterioration. Critical care was time spent personally by me on the following activities: development of treatment plan with patient and/or surrogate as well as nursing, discussions with consultants, evaluation of patient's response to treatment, examination of patient, obtaining history from patient or surrogate, ordering and performing treatments and interventions, ordering and review of laboratory studies, ordering and review of radiographic studies, pulse oximetry and re-evaluation of patient's condition.  EKG Interpretation  Date/Time:  Monday September 09 2021 16:05:44 EST Ventricular Rate:  126 PR Interval:    QRS Duration: 114 QT Interval:  336 QTC Calculation: 487 R Axis:   10 Text Interpretation: Atrial fibrillation Incomplete left bundle branch block Borderline low voltage, extremity leads Borderline prolonged QT interval Confirmed by Fredia Sorrow [phone]) on 09/09/2021 4:14:10 PM  Patient seen by me along with physician assistant.  Patient sent in by EMS from primary care's office.  Concern is for sepsis.  Temp here is 101.6.  Heart rate is 130.  Patient has a history of atrial fibs he is in atrial fib.  He is on Eliquis.  Normally on diltiazem.  Respirations 25 blood pressure is good at 168/126 having elevated but Daneil Dan not hypotensive.  Patient meet sepsis criteria.  Sepsis order set will be attained.  Patient's daughter states that he lives independently.  He was driving on Saturday everything was fine.  So this is markedly abnormal for him.  Will require  admission.  Labs are still pending.  Broad-spectrum antibiotics have been ordered.  Patient is very confused.  And he is normally totally oriented.  Not get review of systems or any kind of significant history from the patient.  So possible sources at this point in time or not known.  But will be determined based on labs chest x-ray COVID flu testing.     Fredia Sorrow, MD 09/09/21 1646  Addendum: Clinically obviously we were concerned about infectious source.  5 he started septic protocol on him.  But did not have any obvious findings.  Regarding that.  However we did find a fairly significant anterior parenchymal bleed in the brain.  Which is fairly large with some mass-effect.  Patient is a DNR clearly by family.  He is currently on BiPAP.  Contacted neurosurgery to see what the plan would be.  Most likely is going to be medical admission with supportive care.  I spoke with family to spell out the findings and the possibilities of treatment or nontreatment.  Patient is on Eliquis.  Based on this we will discussed with neurosurgery whether they want to do any reversal keeping in mind that patient is a DNR.    Fredia Sorrow, MD 09/09/21 2024

## 2021-09-09 NOTE — Progress Notes (Signed)
Pt transported to CT scan and then moved to room RESB.  Pt remained stable and comfortable throughout trip.

## 2021-09-09 NOTE — ED Provider Notes (Addendum)
[name]   CSN: [id number] Arrival date & time: 09/09/21  1515     History Chief Complaint  Patient presents with  . Blood Infection    Derek Schneider is a 87 y.o. male.  HPI Patient is a 87 year old male with history of persistent atrial fibrillation anticoagulated on Xarelto, mitral regurgitation, CAD, CHF, who presents to the emergency department due to altered mental status.  His daughter is at bedside.  She states that he is normally A&O x3 and is independent and has no difficulty with his ADLs.  He lives alone.  She last spoke to him 2 days ago and he was behaving normally.  She went to see him this morning and patient was acutely altered.  States that he was having difficulty getting around the home and was speaking erratically.  On my exam patient is A&O x1.  Not clearly answering questions or following commands.  Level 5 caveat due to altered mental status.  Echocardiogram complete obtained on March 26, 2021 showing a left ventricular ejection fraction by estimation between 70% to 75%.    Past Medical History:  Diagnosis Date  . Acoustic neuroma (HCC)    Right ear  . Aortic insufficiency   . Asthma   . Bronchitis, not specified as acute or chronic   . Chronic diastolic CHF (congestive heart failure) ([place])   . Diverticulosis of colon (without mention of hemorrhage)   . Hiatal hernia   . Internal hemorrhoids without mention of complication   . Mild CAD   . Mitral regurgitation   . Persistent atrial fibrillation ([place])   . Personal history of colonic polyps 01/12/2008   TUBULOVILLOUS ADENOMA  . PVC (premature ventricular contraction)   . Stricture and stenosis of esophagus   . Unspecified essential hypertension   . Unspecified gastritis and gastroduodenitis without mention of hemorrhage   . Wears contact lenses    right eye    Patient Active Problem List   Diagnosis Date Noted  . Intracranial bleeding ([place])  09/09/2021  . Atrial fibrillation ([place]) 05/03/2019  . Chest pain, atypical 11/17/2011  . Hx of gastroesophageal reflux (GERD) 11/17/2011  . GERD 06/29/2008  . Essential hypertension 06/28/2008  . ESOPHAGEAL STRICTURE 06/28/2008  . HIATAL HERNIA 06/28/2008  . DIVERTICULOSIS, COLON 06/28/2008    Past Surgical History:  Procedure Laterality Date  . CARDIAC CATHETERIZATION    . ESOPHAGOGASTRODUODENOSCOPY (EGD) WITH PROPOFOL N/A 09/16/2019   Procedure: ESOPHAGOGASTRODUODENOSCOPY (EGD) WITH PROPOFOL;  Surgeon: Carol Ada, MD;  Location: [facility];  Service: Endoscopy;  Laterality: N/A;  . KNEE SURGERY  as a child   left patella removal as a child  . SAVORY DILATION N/A 09/16/2019   Procedure: SAVORY DILATION;  Surgeon: Carol Ada, MD;  Location: [facility];  Service: Endoscopy;  Laterality: N/A;  . TESTICLAR CYST EXCISION    . TONSILLECTOMY         Family History  Problem Relation Age of Onset  . Lung cancer Brother   . Heart disease Father   . Kidney disease Father   . Cancer Brother     Social History   Tobacco Use  . Smoking status: Never  . Smokeless tobacco: Never  Vaping Use  . Vaping Use: Never used  Substance Use Topics  . Alcohol use: No  . Drug use: No    Home Medications Prior to Admission medications   Medication Sig Start Date End Date Taking? Authorizing Provider  Rivaroxaban (XARELTO) 15  MG TABS tablet TAKE 1 TABLET BY MOUTH ONCE DAILY WITH SUPPER Patient taking differently: 15 mg daily with supper. 04/05/21  Yes Nahser, Wonda Cheng, MD  albuterol (VENTOLIN HFA) 108 (90 Base) MCG/ACT inhaler Inhale 1 puff into the lungs every 6 (six) hours as needed for wheezing or shortness of breath.    [provider]  cholecalciferol (VITAMIN D) 1000 UNITS tablet Take 1,000 Units by mouth daily.    [provider]  DEXILANT 60 MG capsule TAKE 1 CAPSULE BY MOUTH ONCE DAILY BEFORE BREAKFAST Patient taking differently: Take 60 mg by mouth daily  before breakfast. 05/04/19   Milus Banister, MD  diltiazem (CARDIZEM CD) 120 MG 24 hr capsule Take 1 capsule (120 mg total) by mouth daily. 02/01/21   Nahser, Wonda Cheng, MD  diltiazem (CARDIZEM) 30 MG tablet TAKE 1 TABLET BY MOUTH AT BEDTIME AS NEEDED (FAST  HEART  RATE/ATRIAL  FIB) Patient taking differently: Take 30 mg by mouth at bedtime as needed (fast heart rate/atrial fib). 03/27/21   Nahser, Wonda Cheng, MD  doxycycline (VIBRA-TABS) 100 MG tablet Take 1 tablet (100 mg total) by mouth 2 (two) times daily. 05/31/21   Felipa Furnace, DPM  finasteride (PROSCAR) 5 MG tablet Take 5 mg by mouth daily.     [provider]  fluticasone furoate-vilanterol (BREO ELLIPTA) 200-25 MCG/ACT AEPB Inhale 1 puff into the lungs daily.    [provider]  furosemide (LASIX) 20 MG tablet Take 1 tablet by mouth once daily Patient taking differently: Take 20 mg by mouth daily. 07/22/21   Nahser, Wonda Cheng, MD  gabapentin (NEURONTIN) 100 MG capsule Take 100-200 mg by mouth See admin instructions. Ordered 07/23/21 - take one capsule (100 mg) by mouth daily at bedtime for 5 days, then increase to two capsules (200 mg) at bedtime    [provider]  losartan (COZAAR) 100 MG tablet Take 0.5 tablets (50 mg total) by mouth 2 (two) times daily. 06/20/21   Dunn, Nedra Hai, PA-C  nebivolol (BYSTOLIC) 2.5 MG tablet Take 1 tablet (2.5 mg total) by mouth daily. 04/03/21   Dunn, Nedra Hai, PA-C  Tamsulosin HCl (FLOMAX) 0.4 MG CAPS Take 0.4 mg by mouth every evening.     [provider]  triamcinolone (KENALOG) 0.1 % Apply 1 application topically 2 (two) times daily as needed (itching/rash). 10/08/20   [provider]    Allergies    Patient has no known allergies.  Review of Systems   Review of Systems  Unable to perform ROS: Mental status change   Physical Exam Updated Vital Signs BP (!) 156/96   Pulse 94   Temp (!) 101.6 F (38.7 C) (Rectal)   Resp (!) 23   Ht 6' (1.829 m)   Wt 75.3  kg   SpO2 100%   BMI 22.51 kg/m   Physical Exam Vitals and nursing note reviewed.  Constitutional:      General: He is in acute distress.     Appearance: Normal appearance. He is ill-appearing. He is not toxic-appearing or diaphoretic.  HENT:     Head: Normocephalic and atraumatic.     Right Ear: External ear normal.     Left Ear: External ear normal.     Nose: Nose normal.     Mouth/Throat:     Pharynx: Oropharynx is clear.  Eyes:     General: No scleral icterus.       Right eye: No discharge.  Left eye: No discharge.     Extraocular Movements: Extraocular movements intact.     Conjunctiva/sclera: Conjunctivae normal.  Cardiovascular:     Rate and Rhythm: Tachycardia present. Rhythm irregular.     Pulses: Normal pulses.     Heart sounds: Normal heart sounds. No murmur heard.   No friction rub. No gallop.  Pulmonary:     Effort: Pulmonary effort is normal. No respiratory distress.     Breath sounds: Normal breath sounds. No stridor. No wheezing, rhonchi or rales.  Abdominal:     General: Abdomen is flat.     Palpations: Abdomen is soft.     Tenderness: There is no abdominal tenderness.     Comments: Abdomen is flat and soft.  Does not exhibit signs of tenderness with deep palpation.  Musculoskeletal:        General: Normal range of motion.     Cervical back: Normal range of motion and neck supple.     Comments: No pedal edema.  Skin:    General: Skin is warm and dry.     Findings: Bruising present.     Comments: Diffuse ecchymosis noted in the left lower extremity.  Neurological:     Comments: A&O x1.  Moving all 4 extremities.  Not clearly answering questions or following commands.    ED Results / Procedures / Treatments   Labs (all labs ordered are listed, but only abnormal results are displayed) Labs Reviewed  COMPREHENSIVE METABOLIC PANEL - Abnormal; Notable for the following components:      Result Value   Glucose, Bld 120 (*)    BUN 24 (*)     Calcium 8.6 (*)    Total Bilirubin 1.8 (*)    GFR, Estimated 58 (*)    All other components within normal limits  CBC WITH DIFFERENTIAL/PLATELET - Abnormal; Notable for the following components:   WBC 12.1 (*)    Neutro Abs 10.6 (*)    Lymphs Abs 0.6 (*)    All other components within normal limits  URINALYSIS, ROUTINE W REFLEX MICROSCOPIC - Abnormal; Notable for the following components:   APPearance CLOUDY (*)    Ketones, ur 5 (*)    Protein, ur 100 (*)    Bacteria, UA RARE (*)    All other components within normal limits  BLOOD GAS, ARTERIAL - Abnormal; Notable for the following components:   pCO2 arterial 31.0 (*)    pO2, Arterial 332 (*)    Acid-base deficit 3.2 (*)    Allens test (pass/fail) POSITIVE (*)    All other components within normal limits  RESP PANEL BY RT-PCR (FLU A&B, COVID) ARPGX2  CULTURE, BLOOD (ROUTINE X 2)  CULTURE, BLOOD (ROUTINE X 2)  URINE CULTURE  LACTIC ACID, PLASMA  PROTIME-INR  APTT  LACTIC ACID, PLASMA    EKG EKG Interpretation  Date/Time:  Monday September 09 2021 16:05:44 EST Ventricular Rate:  126 PR Interval:    QRS Duration: 114 QT Interval:  336 QTC Calculation: 487 R Axis:   10 Text Interpretation: Atrial fibrillation Incomplete left bundle branch block Borderline low voltage, extremity leads Borderline prolonged QT interval Confirmed by Fredia Sorrow [phone]) on 09/09/2021 4:14:10 PM  Radiology CT HEAD WO CONTRAST (5MM)  Result Date: 09/09/2021 CLINICAL DATA:  Delirium, altered mental status, sepsis. EXAM: CT HEAD WITHOUT CONTRAST TECHNIQUE: Contiguous axial images were obtained from the base of the skull through the vertex without intravenous contrast. COMPARISON:  None. FINDINGS: Brain: Acute parenchymal hemorrhage within the RIGHT  frontal lobe, measuring approximally 5.3 cm greatest dimension, with surrounding parenchymal edema. The combination of hemorrhage and edema is causing mass effect on the adjacent parenchyma,  effacement of the frontal horn of the RIGHT lateral ventricle, and approximately 12 mm leftward midline shift. No evidence of transtentorial or tonsillar herniation. Small amount of acute hemorrhage layering within the occipital horns of each ventricle. Vascular: Chronic calcified atherosclerotic changes of the large vessels at the skull base. No unexpected hyperdense vessel. Skull: Normal. Negative for fracture or focal lesion. Sinuses/Orbits: No acute finding. Other: None. IMPRESSION: 1. Acute large parenchymal hemorrhage within the RIGHT frontal lobe, measuring approximally 5 cm greatest dimension, with surrounding parenchymal edema, most likely hypertensive or hemorrhagic infarct. The combination of hemorrhage and edema is causing mass effect on the adjacent frontal lobe parenchyma, effacement of the frontal horn of the RIGHT lateral ventricle, and approximately 12 mm leftward midline shift. 2. Small amount of associated acute hemorrhage layering within the occipital horns of each ventricle. Critical Value/emergent results were called by telephone at the time of interpretation on 09/09/2021 at 7:58 pm to provider [facility] , who verbally acknowledged these results. Electronically Signed   By: Franki Cabot M.D.   On: 09/09/2021 20:00   DG Chest Portable 1 View  Result Date: 09/09/2021 CLINICAL DATA:  Shortness of breath. EXAM: PORTABLE CHEST 1 VIEW COMPARISON:  Chest x-ray 09/09/2021 FINDINGS: The heart is enlarged, unchanged. There is minimal atelectasis in the lower lungs. There is no lung consolidation, pleural effusion or pneumothorax. Mediastinal silhouette appears stable. There are no acute fractures. IMPRESSION: 1. Cardiomegaly. 2. No evidence for pneumonia or edema. Electronically Signed   By: Ronney Asters M.D.   On: 09/09/2021 18:33   DG Chest Port 1 View  Result Date: 09/09/2021 CLINICAL DATA:  Possible sepsis EXAM: PORTABLE CHEST 1 VIEW COMPARISON:  CT 10/03/2014 FINDINGS: No focal  consolidation or pleural effusion. Mild cardiomegaly. Streaky atelectasis or scarring at the bases. No visible pneumothorax. IMPRESSION: 1. Cardiomegaly without acute focal airspace disease 2. Streaky atelectasis or scarring at the bases. Electronically Signed   By: Donavan Foil M.D.   On: 09/09/2021 17:08   CT CHEST ABDOMEN PELVIS WO CONTRAST  Result Date: 09/09/2021 CLINICAL DATA:  Sepsis and abdominal pain. EXAM: CT CHEST, ABDOMEN AND PELVIS WITHOUT CONTRAST TECHNIQUE: Multidetector CT imaging of the chest, abdomen and pelvis was performed following the standard protocol without IV contrast. COMPARISON:  CT chest 10/03/2014.  CT abdomen and pelvis 09/04/2010. FINDINGS: CT CHEST FINDINGS Cardiovascular: The heart is mildly enlarged. Aorta is normal in size. There are atherosclerotic calcifications of the aorta and coronary arteries. There is no pericardial effusion. Mediastinum/Nodes: No enlarged mediastinal, hilar, or axillary lymph nodes. Thyroid gland, trachea, and esophagus demonstrate no significant findings. Lungs/Pleura: There is smooth interlobular septal thickening in the lung apices. There is patchy airspace disease and atelectasis in the bilateral lower lobes. There are small bilateral pleural effusions, right greater than left. There is also some airspace disease in the dependent portions of the bilateral upper lobes. There is no pneumothorax. Trachea and central airways are patent. Musculoskeletal: No chest wall mass or suspicious bone lesions identified. No acute fractures. CT ABDOMEN PELVIS FINDINGS Hepatobiliary: No focal liver abnormality is seen. No gallstones, gallbladder wall thickening, or biliary dilatation. Pancreas: Unremarkable. No pancreatic ductal dilatation or surrounding inflammatory changes. Spleen: Normal in size without focal abnormality. Adrenals/Urinary Tract: The bilateral adrenal glands are within normal limits. There are numerous bilateral cysts and hypodensities which  are too small to characterize. These have increased in size and number compared to the prior study. The largest cyst on the left measures 4.5 cm. The largest cyst on the right measures 2.7 cm. There are additional mildly hyperdense lesions which are too small to characterize in both kidneys. There is no hydronephrosis. No urinary tract calculi are seen. Bladder is within normal limits. Stomach/Bowel: Stomach is within normal limits. Appendix appears normal. No evidence of bowel wall thickening, distention, or inflammatory changes. Vascular/Lymphatic: No significant vascular findings are present. No enlarged abdominal or pelvic lymph nodes. Reproductive: Prostate gland is mildly enlarged. Other: There is a small right inguinal hernia containing nondilated bowel. There is a small fat containing left inguinal hernia. There is no ascites or free air. Musculoskeletal: Degenerative changes affect the spine. Sclerotic density in the left iliac bone measures 11 mm and has increased in size compared to 2011. IMPRESSION: 1. Small bilateral pleural effusions. 2. Bilateral upper lobe and lower lobe airspace disease may represent infection or edema. 3. Cardiomegaly. 4. Bilateral renal cysts. Additional hyperdense areas in both kidneys are too small to characterize on this study and indeterminate, possibly complex cysts. These can be further evaluated with MRI as clinically warranted. 5. Right inguinal hernia containing bowel.  No bowel obstruction. 6. Small sclerotic osseous lesion in the left pelvis has minimally increased in size compared to 2011. Electronically Signed   By: Ronney Asters M.D.   On: 09/09/2021 20:10    Procedures .Critical Care Performed by: Rayna Sexton, PA-C Authorized by: Rayna Sexton, PA-C   Critical care provider statement:    Critical care time (minutes):  45   Critical care was necessary to treat or prevent imminent or life-threatening deterioration of the following conditions:   Respiratory failure and CNS failure or compromise   Critical care was time spent personally by me on the following activities:  Development of treatment plan with patient or surrogate, discussions with consultants, evaluation of patient's response to treatment, examination of patient, ordering and review of laboratory studies, ordering and review of radiographic studies, ordering and performing treatments and interventions, pulse oximetry, re-evaluation of patient's condition and review of old charts   Medications Ordered in ED Medications  lactated ringers infusion (0 mLs Intravenous Stopped 09/09/21 1816)  lactated ringers bolus 1,000 mL (0 mLs Intravenous Stopped 09/09/21 1817)  ceFEPIme (MAXIPIME) 2 g in sodium chloride 0.9 % 100 mL IVPB (0 g Intravenous Stopped 09/09/21 1754)  metroNIDAZOLE (FLAGYL) IVPB 500 mg (0 mg Intravenous Stopped 09/09/21 1902)  acetaminophen (TYLENOL) suppository 650 mg (650 mg Rectal Given 09/09/21 1703)  vancomycin (VANCOREADY) IVPB 1500 mg/300 mL (0 mg Intravenous Stopped 09/09/21 1902)  LORazepam (ATIVAN) injection 1 mg (1 mg Intravenous Given 09/09/21 1757)  LORazepam (ATIVAN) injection 0.5 mg (0.5 mg Intravenous Given 09/09/21 1930)    ED Course  I have reviewed the triage vital signs and the nursing notes.  Pertinent labs & imaging results that were available during my care of the patient were reviewed by me and considered in my medical decision making (see chart for details).  Clinical Course as of 09/09/21 2155  [facility] Sep 09, 2021  1607 Patient already started on 1400 cc of normal saline at his PCPs office.  30 cc/kg bolus equates to 2355 cc.  We will give an additional liter of lactated Ringer's.  Febrile and acutely altered.  Code sepsis initiated.  Will start on broad-spectrum antibiotics. [LJ]  1920 Patient's daughter and son at bedside both confirm  that he is a DNR. [LJ]  2027 Patient discussed with Dr. Venetia Constable with neurosurgery.  Does not feel  that surgical intervention is reasonable at this time.  States that we can stop the Xarelto but do not need to actively reverse it.  Keep systolic pressures under 329.  Comfort care.  Do not initiate Decadron. [LJ]    Clinical Course User Index [LJ] Rayna Sexton, PA-C   MDM Rules/Calculators/A&P                          Pt is a 87 y.o. male who presents to the emergency department febrile, tachycardic, with altered mental status.  Patient has a history of atrial fibrillation and is anticoagulated on Xarelto.  Labs: CBC, CMP, APTT, PT/INR, lactic acid, UA, respiratory panel, ABG with abnormalities as noted above. Urine culture and blood cultures obtained.  Imaging: Chest x-ray as well as CTs of the head, chest, abdomen, and pelvis with findings as noted above.  I, Rayna Sexton, PA-C, personally reviewed and evaluated these images and lab results as part of my medical decision-making.  No source of fever initially identified.  Patient required Ativan due to agitation and combativeness.  CTs were obtained and CT scan of his brain shows a large parenchymal hemorrhage.    Patient discussed with Dr. Venetia Constable with neurosurgery.  Does not feel that surgical intervention is reasonable at this time.  States that we can stop the Xarelto but do not need to actively reverse it.  Keep systolic pressures under 191.  Comfort care.  Do not initiate Decadron.  Patient will require admission for further management.  We will discuss with the medicine team.  We will additionally place a palliative care consult.  This was discussed with his son who was agreeable.  Patient's son and daughter both confirm that patient is a DNR.  His son request that we keep patient on BiPAP at this time.  Note: Portions of this report may have been transcribed using voice recognition software. Every effort was made to ensure accuracy; however, inadvertent computerized transcription errors may be present.   Final  Clinical Impression(s) / ED Diagnoses Final diagnoses:  Intracranial hemorrhage ([place])  Altered mental status, unspecified altered mental status type  Fever, unspecified fever cause   Rx / DC Orders ED Discharge Orders     None        Rayna Sexton, PA-C 09/09/21 2105    Rayna Sexton, PA-C 09/09/21 2155    Fredia Sorrow, MD 09/12/21 1911

## 2021-09-09 NOTE — H&P (Signed)
History and Physical    Derek Schneider LFY:[id number] DOB: [birth year] DOA: 09/09/2021  PCP: Hayden Rasmussen, MD  Patient coming from: Home.  History obtained from patient's son.  Patient is encephalopathic.  Chief Complaint: Confusion.  HPI: Derek Schneider is a 87 y.o. male with history of CHF, atrial fibrillation on Xarelto, hypertension was found to be confused by patient's daughter this morning.  Was taken to his PCP was found getting more agitated and was brought to the ER concerning for possible sepsis.  Per family patient was doing fine yesterday and had attended meetings at discharge.  He did have a fall this morning besides not sure if he had a fall before or after the confusion.  He did throw up once.  ED Course: In the ER initially patient was tachycardic febrile with temperature 101.6 F A. fib with RVR concerning for sepsis had blood cultures drawn CT chest abdomen pelvis shows infiltrates in the lung empiric antibiotics were started.  CT head was done which shows large intraparenchymal hemorrhage in the right frontal lobe measuring about 5 cm in the largest dimension with mass-effect and midline shift.  ER physician discussed with Dr. Venetia Constable on-call neurosurgeon who at this time advised patient is not a candidate for any surgical procedure and to keep blood pressure systolic less than 852 and comfort measures.  Not necessary to reverse Xarelto.  At this time family has been made aware and they are also moving in the direction of no aggressive measures but continue with BiPAP and continue present therapy.  COVID test was negative.  Review of Systems: As per HPI, rest all negative.   Past Medical History:  Diagnosis Date   Acoustic neuroma ([place])    Right ear   Aortic insufficiency    Asthma    Bronchitis, not specified as acute or chronic    Chronic diastolic CHF (congestive heart failure) (HCC)    Diverticulosis of colon (without mention of hemorrhage)    Hiatal  hernia    Internal hemorrhoids without mention of complication    Mild CAD    Mitral regurgitation    Persistent atrial fibrillation (HCC)    Personal history of colonic polyps 01/12/2008   TUBULOVILLOUS ADENOMA   PVC (premature ventricular contraction)    Stricture and stenosis of esophagus    Unspecified essential hypertension    Unspecified gastritis and gastroduodenitis without mention of hemorrhage    Wears contact lenses    right eye    Past Surgical History:  Procedure Laterality Date   CARDIAC CATHETERIZATION     ESOPHAGOGASTRODUODENOSCOPY (EGD) WITH PROPOFOL N/A 09/16/2019   Procedure: ESOPHAGOGASTRODUODENOSCOPY (EGD) WITH PROPOFOL;  Surgeon: Carol Ada, MD;  Location: [facility];  Service: Endoscopy;  Laterality: N/A;   KNEE SURGERY  as a child   left patella removal as a child   SAVORY DILATION N/A 09/16/2019   Procedure: SAVORY DILATION;  Surgeon: Carol Ada, MD;  Location: [facility];  Service: Endoscopy;  Laterality: N/A;   TESTICLAR CYST EXCISION     TONSILLECTOMY       reports that he has never smoked. He has never used smokeless tobacco. He reports that he does not drink alcohol and does not use drugs.  No Known Allergies  Family History  Problem Relation Age of Onset   Lung cancer Brother    Heart disease Father    Kidney disease Father    Cancer Brother     Prior to Admission medications  Medication Sig Start Date End Date Taking? Authorizing Provider  Rivaroxaban (XARELTO) 15 MG TABS tablet TAKE 1 TABLET BY MOUTH ONCE DAILY WITH SUPPER Patient taking differently: 15 mg daily with supper. 04/05/21  Yes Nahser, Wonda Cheng, MD  albuterol (VENTOLIN HFA) 108 (90 Base) MCG/ACT inhaler Inhale 1 puff into the lungs every 6 (six) hours as needed for wheezing or shortness of breath.    [provider]  cholecalciferol (VITAMIN D) 1000 UNITS tablet Take 1,000 Units by mouth daily.    [provider]  DEXILANT 60 MG capsule TAKE 1 CAPSULE  BY MOUTH ONCE DAILY BEFORE BREAKFAST Patient taking differently: Take 60 mg by mouth daily before breakfast. 05/04/19   Milus Banister, MD  diltiazem (CARDIZEM CD) 120 MG 24 hr capsule Take 1 capsule (120 mg total) by mouth daily. 02/01/21   Nahser, Wonda Cheng, MD  diltiazem (CARDIZEM) 30 MG tablet TAKE 1 TABLET BY MOUTH AT BEDTIME AS NEEDED (FAST  HEART  RATE/ATRIAL  FIB) Patient taking differently: Take 30 mg by mouth at bedtime as needed (fast heart rate/atrial fib). 03/27/21   Nahser, Wonda Cheng, MD  doxycycline (VIBRA-TABS) 100 MG tablet Take 1 tablet (100 mg total) by mouth 2 (two) times daily. 05/31/21   Felipa Furnace, DPM  finasteride (PROSCAR) 5 MG tablet Take 5 mg by mouth daily.     [provider]  fluticasone furoate-vilanterol (BREO ELLIPTA) 200-25 MCG/ACT AEPB Inhale 1 puff into the lungs daily.    [provider]  furosemide (LASIX) 20 MG tablet Take 1 tablet by mouth once daily Patient taking differently: Take 20 mg by mouth daily. 07/22/21   Nahser, Wonda Cheng, MD  gabapentin (NEURONTIN) 100 MG capsule Take 100-200 mg by mouth See admin instructions. Ordered 07/23/21 - take one capsule (100 mg) by mouth daily at bedtime for 5 days, then increase to two capsules (200 mg) at bedtime    [provider]  losartan (COZAAR) 100 MG tablet Take 0.5 tablets (50 mg total) by mouth 2 (two) times daily. 06/20/21   Dunn, Nedra Hai, PA-C  nebivolol (BYSTOLIC) 2.5 MG tablet Take 1 tablet (2.5 mg total) by mouth daily. 04/03/21   Dunn, Nedra Hai, PA-C  Tamsulosin HCl (FLOMAX) 0.4 MG CAPS Take 0.4 mg by mouth every evening.     [provider]  triamcinolone (KENALOG) 0.1 % Apply 1 application topically 2 (two) times daily as needed (itching/rash). 10/08/20   [provider]    Physical Exam: Constitutional: Moderately built and nourished. Vitals:   09/09/21 2030 09/09/21 2130 09/09/21 2145 09/09/21 2200  BP: (!) 156/96 (!) 152/96 (!) 155/81 (!) 143/92  Pulse: 94  80 77 83  Resp: (!) 23 17 13  (!) 25  Temp:      TempSrc:      SpO2: 100% 100% 100% 100%  Weight:      Height:       Eyes: Anicteric no pallor. ENMT: No discharge from the ears eyes nose and mouth. Neck: No mass felt.  No neck rigidity. Respiratory: No rhonchi or crepitations. Cardiovascular: S1-S2 heard. Abdomen: Soft nontender bowel sound present. Musculoskeletal: Multiple bruises on extremities. Skin: Multiple bruises in extremities. Neurologic: Patient is encephalopathic does not follow commands or respond to verbal stimuli.  Pupils are reactive to light. Psychiatric: Patient is encephalopathic.   Labs on Admission: I have personally reviewed following labs and imaging studies  CBC: Recent Labs  Lab 09/09/21 1606  WBC 12.1*  NEUTROABS 10.6*  HGB 13.3  HCT 39.8  MCV 90.2  PLT 500   Basic Metabolic Panel: Recent Labs  Lab 09/09/21 1606  NA 135  K 3.8  CL 101  CO2 23  GLUCOSE 120*  BUN 24*  CREATININE 1.17  CALCIUM 8.6*   GFR: Estimated Creatinine Clearance: 42 mL/min (by C-G formula based on SCr of 1.17 mg/dL). Liver Function Tests: Recent Labs  Lab 09/09/21 1606  AST 31  ALT 21  ALKPHOS 65  BILITOT 1.8*  PROT 7.2  ALBUMIN 3.9   No results for input(s): LIPASE, AMYLASE in the last 168 hours. No results for input(s): AMMONIA in the last 168 hours. Coagulation Profile: Recent Labs  Lab 09/09/21 1606  INR 1.1   Cardiac Enzymes: No results for input(s): CKTOTAL, CKMB, CKMBINDEX, TROPONINI in the last 168 hours. BNP (last 3 results) Recent Labs    02/26/21 0858  PROBNP 2,005*   HbA1C: No results for input(s): HGBA1C in the last 72 hours. CBG: No results for input(s): GLUCAP in the last 168 hours. Lipid Profile: No results for input(s): CHOL, HDL, LDLCALC, TRIG, CHOLHDL, LDLDIRECT in the last 72 hours. Thyroid Function Tests: No results for input(s): TSH, T4TOTAL, FREET4, T3FREE, THYROIDAB in the last 72 hours. Anemia Panel: No results  for input(s): VITAMINB12, FOLATE, FERRITIN, TIBC, IRON, RETICCTPCT in the last 72 hours. Urine analysis:    Component Value Date/Time   COLORURINE YELLOW 09/09/2021 1637   APPEARANCEUR CLOUDY (A) 09/09/2021 1637   LABSPEC 1.014 09/09/2021 1637   PHURINE 8.0 09/09/2021 1637   GLUCOSEU NEGATIVE 09/09/2021 1637   HGBUR NEGATIVE 09/09/2021 [place] 09/09/2021 1637   KETONESUR 5 (A) 09/09/2021 1637   PROTEINUR 100 (A) 09/09/2021 1637   NITRITE NEGATIVE 09/09/2021 1637   LEUKOCYTESUR NEGATIVE 09/09/2021 1637   Sepsis Labs: @LABRCNTIP (procalcitonin:4,lacticidven:4) ) Recent Results (from the past 240 hour(s))  Resp Panel by RT-PCR (Flu A&B, Covid)     Status: None   Collection Time: 09/09/21  4:37 PM   Specimen: Nasopharyngeal(NP) swabs in vial transport medium  Result Value Ref Range Status   SARS Coronavirus 2 by RT PCR NEGATIVE NEGATIVE Final    Comment: (NOTE) SARS-CoV-2 target nucleic acids are NOT DETECTED.  The SARS-CoV-2 RNA is generally detectable in upper respiratory specimens during the acute phase of infection. The lowest concentration of SARS-CoV-2 viral copies this assay can detect is 138 copies/mL. A negative result does not preclude SARS-Cov-2 infection and should not be used as the sole basis for treatment or other patient management decisions. A negative result may occur with  improper specimen collection/handling, submission of specimen other than nasopharyngeal swab, presence of viral mutation(s) within the areas targeted by this assay, and inadequate number of viral copies(<138 copies/mL). A negative result must be combined with clinical observations, patient history, and epidemiological information. The expected result is Negative.  Fact Sheet for Patients:  EntrepreneurPulse.com.au  Fact Sheet for Healthcare Providers:  IncredibleEmployment.be  This test is no t yet approved or cleared by the [place] FDA and  has been authorized for detection and/or diagnosis of SARS-CoV-2 by FDA under an Emergency Use Authorization (EUA). This EUA will remain  in effect (meaning this test can be used) for the duration of the COVID-19 declaration under Section 564(b)(1) of the Act, 21 U.S.C.section 360bbb-3(b)(1), unless the authorization is terminated  or revoked sooner.       Influenza A by PCR NEGATIVE NEGATIVE Final   Influenza B by PCR NEGATIVE NEGATIVE Final  Comment: (NOTE) The Xpert Xpress SARS-CoV-2/FLU/RSV plus assay is intended as an aid in the diagnosis of influenza from Nasopharyngeal swab specimens and should not be used as a sole basis for treatment. Nasal washings and aspirates are unacceptable for Xpert Xpress SARS-CoV-2/FLU/RSV testing.  Fact Sheet for Patients: EntrepreneurPulse.com.au  Fact Sheet for Healthcare Providers: IncredibleEmployment.be  This test is not yet approved or cleared by the [place] FDA and has been authorized for detection and/or diagnosis of SARS-CoV-2 by FDA under an Emergency Use Authorization (EUA). This EUA will remain in effect (meaning this test can be used) for the duration of the COVID-19 declaration under Section 564(b)(1) of the Act, 21 U.S.C. section 360bbb-3(b)(1), unless the authorization is terminated or revoked.  Performed at [facility] [place] [address] [place], [place] [address]      Radiological Exams on Admission: CT HEAD WO CONTRAST (5MM)  Result Date: 09/09/2021 CLINICAL DATA:  Delirium, altered mental status, sepsis. EXAM: CT HEAD WITHOUT CONTRAST TECHNIQUE: Contiguous axial images were obtained from the base of the skull through the vertex without intravenous contrast. COMPARISON:  None. FINDINGS: Brain: Acute parenchymal hemorrhage within the RIGHT frontal lobe, measuring approximally 5.3 cm greatest dimension, with surrounding parenchymal edema. The  combination of hemorrhage and edema is causing mass effect on the adjacent parenchyma, effacement of the frontal horn of the RIGHT lateral ventricle, and approximately 12 mm leftward midline shift. No evidence of transtentorial or tonsillar herniation. Small amount of acute hemorrhage layering within the occipital horns of each ventricle. Vascular: Chronic calcified atherosclerotic changes of the large vessels at the skull base. No unexpected hyperdense vessel. Skull: Normal. Negative for fracture or focal lesion. Sinuses/Orbits: No acute finding. Other: None. IMPRESSION: 1. Acute large parenchymal hemorrhage within the RIGHT frontal lobe, measuring approximally 5 cm greatest dimension, with surrounding parenchymal edema, most likely hypertensive or hemorrhagic infarct. The combination of hemorrhage and edema is causing mass effect on the adjacent frontal lobe parenchyma, effacement of the frontal horn of the RIGHT lateral ventricle, and approximately 12 mm leftward midline shift. 2. Small amount of associated acute hemorrhage layering within the occipital horns of each ventricle. Critical Value/emergent results were called by telephone at the time of interpretation on 09/09/2021 at 7:58 pm to provider [facility] , who verbally acknowledged these results. Electronically Signed   By: Franki Cabot M.D.   On: 09/09/2021 20:00   DG Chest Portable 1 View  Result Date: 09/09/2021 CLINICAL DATA:  Shortness of breath. EXAM: PORTABLE CHEST 1 VIEW COMPARISON:  Chest x-ray 09/09/2021 FINDINGS: The heart is enlarged, unchanged. There is minimal atelectasis in the lower lungs. There is no lung consolidation, pleural effusion or pneumothorax. Mediastinal silhouette appears stable. There are no acute fractures. IMPRESSION: 1. Cardiomegaly. 2. No evidence for pneumonia or edema. Electronically Signed   By: Ronney Asters M.D.   On: 09/09/2021 18:33   DG Chest Port 1 View  Result Date: 09/09/2021 CLINICAL DATA:   Possible sepsis EXAM: PORTABLE CHEST 1 VIEW COMPARISON:  CT 10/03/2014 FINDINGS: No focal consolidation or pleural effusion. Mild cardiomegaly. Streaky atelectasis or scarring at the bases. No visible pneumothorax. IMPRESSION: 1. Cardiomegaly without acute focal airspace disease 2. Streaky atelectasis or scarring at the bases. Electronically Signed   By: Donavan Foil M.D.   On: 09/09/2021 17:08   CT CHEST ABDOMEN PELVIS WO CONTRAST  Result Date: 09/09/2021 CLINICAL DATA:  Sepsis and abdominal pain. EXAM: CT CHEST, ABDOMEN AND PELVIS WITHOUT CONTRAST TECHNIQUE: Multidetector CT imaging of  the chest, abdomen and pelvis was performed following the standard protocol without IV contrast. COMPARISON:  CT chest 10/03/2014.  CT abdomen and pelvis 09/04/2010. FINDINGS: CT CHEST FINDINGS Cardiovascular: The heart is mildly enlarged. Aorta is normal in size. There are atherosclerotic calcifications of the aorta and coronary arteries. There is no pericardial effusion. Mediastinum/Nodes: No enlarged mediastinal, hilar, or axillary lymph nodes. Thyroid gland, trachea, and esophagus demonstrate no significant findings. Lungs/Pleura: There is smooth interlobular septal thickening in the lung apices. There is patchy airspace disease and atelectasis in the bilateral lower lobes. There are small bilateral pleural effusions, right greater than left. There is also some airspace disease in the dependent portions of the bilateral upper lobes. There is no pneumothorax. Trachea and central airways are patent. Musculoskeletal: No chest wall mass or suspicious bone lesions identified. No acute fractures. CT ABDOMEN PELVIS FINDINGS Hepatobiliary: No focal liver abnormality is seen. No gallstones, gallbladder wall thickening, or biliary dilatation. Pancreas: Unremarkable. No pancreatic ductal dilatation or surrounding inflammatory changes. Spleen: Normal in size without focal abnormality. Adrenals/Urinary Tract: The bilateral adrenal  glands are within normal limits. There are numerous bilateral cysts and hypodensities which are too small to characterize. These have increased in size and number compared to the prior study. The largest cyst on the left measures 4.5 cm. The largest cyst on the right measures 2.7 cm. There are additional mildly hyperdense lesions which are too small to characterize in both kidneys. There is no hydronephrosis. No urinary tract calculi are seen. Bladder is within normal limits. Stomach/Bowel: Stomach is within normal limits. Appendix appears normal. No evidence of bowel wall thickening, distention, or inflammatory changes. Vascular/Lymphatic: No significant vascular findings are present. No enlarged abdominal or pelvic lymph nodes. Reproductive: Prostate gland is mildly enlarged. Other: There is a small right inguinal hernia containing nondilated bowel. There is a small fat containing left inguinal hernia. There is no ascites or free air. Musculoskeletal: Degenerative changes affect the spine. Sclerotic density in the left iliac bone measures 11 mm and has increased in size compared to 2011. IMPRESSION: 1. Small bilateral pleural effusions. 2. Bilateral upper lobe and lower lobe airspace disease may represent infection or edema. 3. Cardiomegaly. 4. Bilateral renal cysts. Additional hyperdense areas in both kidneys are too small to characterize on this study and indeterminate, possibly complex cysts. These can be further evaluated with MRI as clinically warranted. 5. Right inguinal hernia containing bowel.  No bowel obstruction. 6. Small sclerotic osseous lesion in the left pelvis has minimally increased in size compared to 2011. Electronically Signed   By: Ronney Asters M.D.   On: 09/09/2021 20:10    EKG: Independently reviewed.  A. fib with RVR.  Assessment/Plan Principal Problem:   Intracranial bleeding (HCC) Active Problems:   Essential hypertension   Hx of gastroesophageal reflux (GERD)   Atrial  fibrillation (HCC)    Intracranial bleed with CT scan showing large intraparenchymal hemorrhage in the right frontal lobe measuring approximately 5 cm in the largest dimension with surrounding edema and mass-effect with 12 mm midline shift towards the left with also associated hemorrhage in the ventricle -ER physician did discuss with Dr. Venetia Constable on-call neurosurgeon who at this time advised patient not a surgical candidate and to keep blood pressure systolic less than 517 and comfort measures.  Discussed with family and patient's family at this time no other patient prognosis is likely poor but wants to continue present measures including BiPAP.  Patient family is awaiting other family members to arrive.  Possible sepsis with CT scan showing possible infiltrates in the lung we will continue antibiotics and await blood cultures. History of hypertension since patient is n.p.o. we will keep patient on p.o. and IV hydralazine for systolic more than 517 and may add additional medicine to keep blood pressure lower than 001 systolic. Acute encephalopathy secondary to #1. History of A. fib was in RVR initially improved with fluids.  Xarelto on hold due to intracranial bleed.  See #1.  Neurosurgeon Dr. Venetia Constable did not advise any reversal of the Xarelto. History of CHF presently receiving fluids.   Since patient has intracranial bleed and possible sepsis with encephalopathy will need close monitoring and inpatient status.   I did discuss with patient's brother and 2 sisters at the bedside.  They are aware of patient's poor prognosis at this time.  Plan is to move towards comfort measures but will continue present medications and BiPAP.  Palliative care has been consulted.  Patient is a DNR.   DVT prophylaxis: SCDs.  Avoiding anticoagulation in the setting of intracranial bleeding. Code Status: DNR. Family Communication: Patient's brother and sister. Disposition Plan: Likely terminal. Consults  called: ER physician discussed with neurosurgeon. Admission status: Inpatient.   Rise Patience MD [facility] Pager [phone].  If 7PM-7AM, please contact night-coverage [email or web address] Password [facility]  09/09/2021, 10:45 PM

## 2021-09-09 NOTE — Sepsis Progress Note (Signed)
eLink monitoring code sepsis.  

## 2021-09-09 NOTE — Progress Notes (Signed)
Pharmacy Antibiotic Note  Derek Schneider is a 87 y.o. male admitted on 09/09/2021 with  history of persistent atrial fibrillation anticoagulated on Xarelto, mitral regurgitation, CAD, CHF, who presents to the emergency department due to altered mental status.  Pharmacy has been consulted to dose vancomycin and cefepime for sepsis  Plan: Vancomycin 1gm IV q24 (AUC 469.7, Scr 1.17, TBW) Cefepime 2gm IV q12h Follow renal function, cultures and clinical course  Height: 6' (182.9 cm) Weight: 75.3 kg (166 lb) IBW/kg (Calculated) : 77.6  Temp (24hrs), Avg:101.6 F (38.7 C), Min:101.6 F (38.7 C), Max:101.6 F (38.7 C)  Recent Labs  Lab 09/09/21 1606 09/09/21 1616  WBC 12.1*  --   CREATININE 1.17  --   LATICACIDVEN  --  1.8    Estimated Creatinine Clearance: 42 mL/min (by C-G formula based on SCr of 1.17 mg/dL).    No Known Allergies  Antimicrobials this admission: 11/28 vanc >> 11/28 cefepime >>  Dose adjustments this admission:   Microbiology results: 11/28 BCx:  11/28 UCx:   Thank you for allowing pharmacy to be a part of this patient's care.  Angela Adam 09/09/2021 11:26 PM

## 2021-09-09 NOTE — Progress Notes (Signed)
A consult was received from an ED physician for vancomycin & cefepime per pharmacy dosing.  The patient's profile has been reviewed for ht/wt/allergies/indication/available labs.    A one time order has been placed for vancomycin 1500 mg & cefepime 2 gm.    Further antibiotics/pharmacy consults should be ordered by admitting physician if indicated.                       Thank you,  Eudelia Bunch, Pharm.D 09/09/2021 4:20 PM

## 2021-09-09 NOTE — ED Notes (Signed)
CT scan completed.

## 2021-09-09 NOTE — ED Triage Notes (Signed)
Pt bib [facility] from PCP office d/t concern for sepsis.  Pt normally A&O x 4 today pt is completely altered.  Pt disoriented x 4.  IV placed at PCP pt given 1400 ml's of NS.

## 2021-09-09 NOTE — ED Notes (Signed)
Respiratory at bedside to help transport to CT and relocate rooms

## 2021-09-09 NOTE — Progress Notes (Signed)
RT called to er due to patient having a sever decrease in saturations to 84% 6l [place]. The patient appeared to have AMS and anxious. He was placed on BIPAP 16/8 100% and appeared to tolerated well right away. An ABG was drawn 25 min later. Rt will continue to monitor

## 2021-09-10 DIAGNOSIS — Z7189 Other specified counseling: Secondary | ICD-10-CM | POA: Diagnosis not present

## 2021-09-10 DIAGNOSIS — R4182 Altered mental status, unspecified: Secondary | ICD-10-CM | POA: Diagnosis not present

## 2021-09-10 DIAGNOSIS — I629 Nontraumatic intracranial hemorrhage, unspecified: Secondary | ICD-10-CM | POA: Diagnosis not present

## 2021-09-10 DIAGNOSIS — Z515 Encounter for palliative care: Secondary | ICD-10-CM

## 2021-09-10 LAB — COMPREHENSIVE METABOLIC PANEL
ALT: 22 U/L (ref 0–44)
AST: 33 U/L (ref 15–41)
Albumin: 3.3 g/dL — ABNORMAL LOW (ref 3.5–5.0)
Alkaline Phosphatase: 62 U/L (ref 38–126)
Anion gap: 7 (ref 5–15)
BUN: 26 mg/dL — ABNORMAL HIGH (ref 8–23)
CO2: 24 mmol/L (ref 22–32)
Calcium: 8.5 mg/dL — ABNORMAL LOW (ref 8.9–10.3)
Chloride: 104 mmol/L (ref 98–111)
Creatinine, Ser: 1.13 mg/dL (ref 0.61–1.24)
GFR, Estimated: >60 mL/min (ref >60)
Glucose, Bld: 101 mg/dL — ABNORMAL HIGH (ref 70–99)
Potassium: 4 mmol/L (ref 3.5–5.1)
Sodium: 135 mmol/L (ref 135–145)
Total Bilirubin: 1.9 mg/dL — ABNORMAL HIGH (ref 0.3–1.2)
Total Protein: 6.2 g/dL — ABNORMAL LOW (ref 6.5–8.1)

## 2021-09-10 LAB — CBC
HCT: 37.9 % — ABNORMAL LOW (ref 39.0–52.0)
Hemoglobin: 12.5 g/dL — ABNORMAL LOW (ref 13.0–17.0)
MCH: 29.6 pg (ref 26.0–34.0)
MCHC: 33 g/dL (ref 30.0–36.0)
MCV: 89.6 fL (ref 80.0–100.0)
Platelets: 156 10*3/uL (ref 150–400)
RBC: 4.23 MIL/uL (ref 4.22–5.81)
RDW: 13.7 % (ref 11.5–15.5)
WBC: 12.1 10*3/uL — ABNORMAL HIGH (ref 4.0–10.5)
nRBC: 0 % (ref 0.0–0.2)

## 2021-09-10 LAB — CBG MONITORING, ED: Glucose-Capillary: 118 mg/dL — ABNORMAL HIGH (ref 70–99)

## 2021-09-10 LAB — URINE CULTURE

## 2021-09-10 MED ORDER — SODIUM CHLORIDE 0.9 % IV SOLN
INTRAVENOUS | Status: AC
  Filled 2021-09-10: qty 2

## 2021-09-10 MED ORDER — LORAZEPAM 2 MG/ML IJ SOLN
1.0000 mg | INTRAMUSCULAR | Status: DC
Start: 2021-09-10 — End: 2021-09-11
  Administered 2021-09-10 – 2021-09-11 (×5): 1 mg via INTRAVENOUS
  Filled 2021-09-10 (×4): qty 1

## 2021-09-10 MED ORDER — LORAZEPAM 2 MG/ML IJ SOLN
INTRAMUSCULAR | Status: AC
  Filled 2021-09-10: qty 1

## 2021-09-10 MED ORDER — ONDANSETRON HCL 4 MG/2ML IJ SOLN: 4.0000 mg | Freq: Four times a day (QID) | INTRAMUSCULAR | Status: DC | PRN

## 2021-09-10 MED ORDER — ALBUTEROL SULFATE (2.5 MG/3ML) 0.083% IN NEBU: 2.5000 mg | INHALATION_SOLUTION | RESPIRATORY_TRACT | Status: DC | PRN

## 2021-09-10 MED ORDER — ONDANSETRON 4 MG PO TBDP: 4.0000 mg | ORAL_TABLET | Freq: Four times a day (QID) | ORAL | Status: DC | PRN

## 2021-09-10 MED ORDER — ACETAMINOPHEN 325 MG PO TABS: 650.0000 mg | ORAL_TABLET | Freq: Four times a day (QID) | ORAL | Status: DC | PRN

## 2021-09-10 MED ORDER — MORPHINE SULFATE (CONCENTRATE) 10 MG/0.5ML PO SOLN: 5.0000 mg | ORAL | Status: DC | PRN

## 2021-09-10 MED ORDER — LORAZEPAM 2 MG/ML IJ SOLN
1.0000 mg | INTRAMUSCULAR | Status: DC | PRN
  Administered 2021-09-10: 0.5 mg via INTRAVENOUS
  Administered 2021-09-10 (×2): 1 mg via INTRAVENOUS

## 2021-09-10 MED ORDER — MORPHINE SULFATE (PF) 2 MG/ML IV SOLN
2.0000 mg | INTRAVENOUS | Status: DC | PRN
  Administered 2021-09-10: 1 mg via INTRAVENOUS
  Administered 2021-09-11 – 2021-09-12 (×4): 2 mg via INTRAVENOUS
  Filled 2021-09-10 (×4): qty 1

## 2021-09-10 MED ORDER — MORPHINE SULFATE (PF) 2 MG/ML IV SOLN: 1.0000 mg | INTRAVENOUS | Status: DC | PRN

## 2021-09-10 MED ORDER — HALOPERIDOL LACTATE 5 MG/ML IJ SOLN: 0.5000 mg | INTRAMUSCULAR | Status: DC | PRN

## 2021-09-10 MED ORDER — HALOPERIDOL 1 MG PO TABS
0.5000 mg | ORAL_TABLET | ORAL | Status: DC | PRN
  Filled 2021-09-10: qty 0.5

## 2021-09-10 MED ORDER — GLYCOPYRROLATE 0.2 MG/ML IJ SOLN
0.2000 mg | INTRAMUSCULAR | Status: DC | PRN
  Administered 2021-09-11 – 2021-09-14 (×10): 0.2 mg via INTRAVENOUS
  Filled 2021-09-10 (×10): qty 1

## 2021-09-10 MED ORDER — LORAZEPAM 2 MG/ML PO CONC
1.0000 mg | ORAL | Status: DC | PRN
  Filled 2021-09-10: qty 0.5

## 2021-09-10 MED ORDER — ATROPINE SULFATE 1 % OP SOLN
4.0000 [drp] | OPHTHALMIC | Status: DC | PRN
  Administered 2021-09-13 – 2021-09-15 (×6): 4 [drp] via SUBLINGUAL
  Filled 2021-09-10: qty 2

## 2021-09-10 MED ORDER — DIPHENHYDRAMINE HCL 50 MG/ML IJ SOLN: 12.5000 mg | INTRAMUSCULAR | Status: DC | PRN

## 2021-09-10 MED ORDER — LORAZEPAM 1 MG PO TABS: 1.0000 mg | ORAL_TABLET | ORAL | Status: DC | PRN

## 2021-09-10 MED ORDER — HALOPERIDOL LACTATE 2 MG/ML PO CONC
0.5000 mg | ORAL | Status: DC | PRN
  Filled 2021-09-10: qty 0.3

## 2021-09-10 MED ORDER — LABETALOL HCL 5 MG/ML IV SOLN: 10.0000 mg | INTRAVENOUS | Status: DC | PRN

## 2021-09-10 MED ORDER — HYDRALAZINE HCL 20 MG/ML IJ SOLN: 10.0000 mg | Freq: Once | INTRAMUSCULAR | Status: DC

## 2021-09-10 MED ORDER — LORAZEPAM 2 MG/ML IJ SOLN
1.0000 mg | INTRAMUSCULAR | Status: DC | PRN
  Administered 2021-09-14: 1 mg via INTRAVENOUS
  Filled 2021-09-10 (×2): qty 1

## 2021-09-10 MED ORDER — ACETAMINOPHEN 650 MG RE SUPP: 650.0000 mg | Freq: Four times a day (QID) | RECTAL | Status: DC | PRN

## 2021-09-10 MED ORDER — SENNA 8.6 MG PO TABS: 1.0000 | ORAL_TABLET | Freq: Every evening | ORAL | Status: DC | PRN

## 2021-09-10 MED ORDER — LORAZEPAM 2 MG/ML IJ SOLN
1.0000 mg | INTRAMUSCULAR | Status: DC | PRN
  Administered 2021-09-10: 1 mg via INTRAVENOUS
  Filled 2021-09-10: qty 1

## 2021-09-10 MED ORDER — POLYVINYL ALCOHOL 1.4 % OP SOLN: 1.0000 [drp] | Freq: Four times a day (QID) | OPHTHALMIC | Status: DC | PRN

## 2021-09-10 NOTE — ED Notes (Signed)
Patient is still agitated and pulling at bipap mask. Message sent to Dr.

## 2021-09-10 NOTE — ED Notes (Addendum)
Verbal order from Dr Hal Hope to give 10mg  hydralazine to keep systolic BP less than 183.

## 2021-09-10 NOTE — ED Notes (Signed)
Did not give hydralazine as systolic blood pressure has not increased over 160. Medication placed on hold.

## 2021-09-10 NOTE — ED Notes (Signed)
Patient was given ativan as he is very agitated and restless in bed. Family is at the bedside.

## 2021-09-10 NOTE — Progress Notes (Addendum)
PROGRESS NOTE    Derek Schneider  RXV:[id number] DOB: 11-Jul-1926 DOA: 09/09/2021 PCP: Hayden Rasmussen, MD   Brief Narrative:  87 year old with history of CHF, A. fib on Xarelto, HTN admitted for confusion from PCP office.  He was found to have intraparenchymal hemorrhage in the right frontal lobe with 5 cm large midline shift effect.  Patient was eventually made comfort care as no further treatment option was available by neurosurgery.   Assessment & Plan:   Principal Problem:   Intracranial bleeding (HCC) Active Problems:   Essential hypertension   Hx of gastroesophageal reflux (GERD)   Atrial fibrillation (HCC)  Intracranial hemorrhage with midline shift, 5 cm bleed with 55m left side shift - Frontal brain bleed with midline shift.  Case discussed by admitting provider with neurosurgeon, not a surgical candidate.  Patient will be transitioned to comfort care  Other medical issues: Sepsis possibly from pneumonia Essential hypertension Atrial fibrillation, permanent Congestive heart failure   At this time patient has extremely poor prognosis, anticipate in-hospital death.  I met with the daughter at bedside who is in agreement to transition patient to comfort measures.    DVT prophylaxis: SCDs Code Status: DNR comfort care Family Communication: Met with daughter at bedside \  Subjective: Patient remains unresponsive on the stretcher. I met with the daughter at bedside and had extensive discussion regarding his intracranial bleed and other comorbidities.  We also went over patient's CT findings.  She is agreeable to transition patient to comfort care  Review of Systems Otherwise negative except as per HPI, including: Unable to obtain  Examination:  Constitutional: Remains unresponsive Respiratory: Clear to auscultation bilaterally Cardiovascular: Normal sinus rhythm, no rubs Abdomen: Nontender nondistended Musculoskeletal: No edema noted Skin: No rashes  seen Neurologic: Unable to assess Psychiatric: Unable to assess  Objective: Vitals:   09/10/21 0630 09/10/21 0730 09/10/21 0745 09/10/21 1030  BP: (!) 157/110 (!) 166/97 (!) 161/86 (!) 151/92  Pulse: 91 (!) 107 86 97  Resp: 17 (!) 26 20 (!) 21  Temp:      TempSrc:      SpO2: 100% 99% 100% 96%  Weight:      Height:        Intake/Output Summary (Last 24 hours) at 09/10/2021 1234 Last data filed at 09/09/2021 1902 Gross per 24 hour  Intake 2500 ml  Output --  Net 2500 ml   Filed Weights   09/09/21 1605  Weight: 75.3 kg     Data Reviewed:   CBC: Recent Labs  Lab 09/09/21 1606 09/10/21 0457  WBC 12.1* 12.1*  NEUTROABS 10.6*  --   HGB 13.3 12.5*  HCT 39.8 37.9*  MCV 90.2 89.6  PLT 184 1509  Basic Metabolic Panel: Recent Labs  Lab 09/09/21 1606 09/10/21 0457  NA 135 135  K 3.8 4.0  CL 101 104  CO2 23 24  GLUCOSE 120* 101*  BUN 24* 26*  CREATININE 1.17 1.13  CALCIUM 8.6* 8.5*   GFR: Estimated Creatinine Clearance: 43.5 mL/min (by C-G formula based on SCr of 1.13 mg/dL). Liver Function Tests: Recent Labs  Lab 09/09/21 1606 09/10/21 0457  AST 31 33  ALT 21 22  ALKPHOS 65 62  BILITOT 1.8* 1.9*  PROT 7.2 6.2*  ALBUMIN 3.9 3.3*   No results for input(s): LIPASE, AMYLASE in the last 168 hours. No results for input(s): AMMONIA in the last 168 hours. Coagulation Profile: Recent Labs  Lab 09/09/21 1606  INR 1.1   Cardiac Enzymes:  No results for input(s): CKTOTAL, CKMB, CKMBINDEX, TROPONINI in the last 168 hours. BNP (last 3 results) Recent Labs    02/26/21 0858  PROBNP 2,005*   HbA1C: No results for input(s): HGBA1C in the last 72 hours. CBG: Recent Labs  Lab 09/10/21 0017  GLUCAP 118*   Lipid Profile: No results for input(s): CHOL, HDL, LDLCALC, TRIG, CHOLHDL, LDLDIRECT in the last 72 hours. Thyroid Function Tests: No results for input(s): TSH, T4TOTAL, FREET4, T3FREE, THYROIDAB in the last 72 hours. Anemia Panel: No results for  input(s): VITAMINB12, FOLATE, FERRITIN, TIBC, IRON, RETICCTPCT in the last 72 hours. Sepsis Labs: Recent Labs  Lab 09/09/21 1616  LATICACIDVEN 1.8    Recent Results (from the past 240 hour(s))  Blood Culture (routine x 2)     Status: None (Preliminary result)   Collection Time: 09/09/21  4:06 PM   Specimen: BLOOD  Result Value Ref Range Status   Specimen Description   Final    BLOOD BLOOD RIGHT FOREARM Performed at [facility] [address], [place], [place] [address]    Special Requests   Final    BOTTLES DRAWN AEROBIC AND ANAEROBIC Blood Culture adequate volume Performed at [facility] [address], [place], [place] [address]    Culture   Final    NO GROWTH < 12 HOURS Performed at [facility] [address], [place], [place] [address]    Report Status PENDING  Incomplete  Blood Culture (routine x 2)     Status: None (Preliminary result)   Collection Time: 09/09/21  4:11 PM   Specimen: BLOOD  Result Value Ref Range Status   Specimen Description   Final    BLOOD RIGHT ANTECUBITAL Performed at [facility] [address], [place], [place] [address]    Special Requests   Final    BOTTLES DRAWN AEROBIC AND ANAEROBIC Blood Culture results may not be optimal due to an excessive volume of blood received in culture bottles Performed at [facility] [address] [place], [place] [address]    Culture   Final    NO GROWTH < 12 HOURS Performed at [facility] [address] [place],  [address]    Report Status PENDING  Incomplete  Resp Panel by RT-PCR (Flu A&B, Covid)     Status: None   Collection Time: 09/09/21  4:37 PM   Specimen: Nasopharyngeal(NP) swabs in vial transport medium  Result Value Ref Range Status   SARS Coronavirus 2 by RT PCR NEGATIVE NEGATIVE Final    Comment: (NOTE) SARS-CoV-2 target nucleic acids are NOT DETECTED.  The SARS-CoV-2 RNA is generally  detectable in upper respiratory specimens during the acute phase of infection. The lowest concentration of SARS-CoV-2 viral copies this assay can detect is 138 copies/mL. A negative result does not preclude SARS-Cov-2 infection and should not be used as the sole basis for treatment or other patient management decisions. A negative result may occur with  improper specimen collection/handling, submission of specimen other than nasopharyngeal swab, presence of viral mutation(s) within the areas targeted by this assay, and inadequate number of viral copies(<138 copies/mL). A negative result must be combined with clinical observations, patient history, and epidemiological information. The expected result is Negative.  Fact Sheet for Patients:  EntrepreneurPulse.com.au  Fact Sheet for Healthcare Providers:  IncredibleEmployment.be  This test is no t yet approved or cleared by the [place] and  has been authorized for  detection and/or diagnosis of SARS-CoV-2 by FDA under an Emergency Use Authorization (EUA). This EUA will remain  in effect (meaning this test can be used) for the duration of the COVID-19 declaration under Section 564(b)(1) of the Act, 21 U.S.C.section 360bbb-3(b)(1), unless the authorization is terminated  or revoked sooner.       Influenza A by PCR NEGATIVE NEGATIVE Final   Influenza B by PCR NEGATIVE NEGATIVE Final    Comment: (NOTE) The Xpert Xpress SARS-CoV-2/FLU/RSV plus assay is intended as an aid in the diagnosis of influenza from Nasopharyngeal swab specimens and should not be used as a sole basis for treatment. Nasal washings and aspirates are unacceptable for Xpert Xpress SARS-CoV-2/FLU/RSV testing.  Fact Sheet for Patients: EntrepreneurPulse.com.au  Fact Sheet for Healthcare Providers: IncredibleEmployment.be  This test is not yet approved or cleared by the [place] FDA  and has been authorized for detection and/or diagnosis of SARS-CoV-2 by FDA under an Emergency Use Authorization (EUA). This EUA will remain in effect (meaning this test can be used) for the duration of the COVID-19 declaration under Section 564(b)(1) of the Act, 21 U.S.C. section 360bbb-3(b)(1), unless the authorization is terminated or revoked.  Performed at [facility], [place] [facility] [address], [place], [place] [address]          Radiology Studies: CT HEAD WO CONTRAST (5MM)  Result Date: 09/09/2021 CLINICAL DATA:  Delirium, altered mental status, sepsis. EXAM: CT HEAD WITHOUT CONTRAST TECHNIQUE: Contiguous axial images were obtained from the base of the skull through the vertex without intravenous contrast. COMPARISON:  None. FINDINGS: Brain: Acute parenchymal hemorrhage within the RIGHT frontal lobe, measuring approximally 5.3 cm greatest dimension, with surrounding parenchymal edema. The combination of hemorrhage and edema is causing mass effect on the adjacent parenchyma, effacement of the frontal horn of the RIGHT lateral ventricle, and approximately 12 mm leftward midline shift. No evidence of transtentorial or tonsillar herniation. Small amount of acute hemorrhage layering within the occipital horns of each ventricle. Vascular: Chronic calcified atherosclerotic changes of the large vessels at the skull base. No unexpected hyperdense vessel. Skull: Normal. Negative for fracture or focal lesion. Sinuses/Orbits: No acute finding. Other: None. IMPRESSION: 1. Acute large parenchymal hemorrhage within the RIGHT frontal lobe, measuring approximally 5 cm greatest dimension, with surrounding parenchymal edema, most likely hypertensive or hemorrhagic infarct. The combination of hemorrhage and edema is causing mass effect on the adjacent frontal lobe parenchyma, effacement of the frontal horn of the RIGHT lateral ventricle, and approximately 12 mm leftward midline shift. 2. Small amount  of associated acute hemorrhage layering within the occipital horns of each ventricle. Critical Value/emergent results were called by telephone at the time of interpretation on 09/09/2021 at 7:58 pm to provider [facility] , who verbally acknowledged these results. Electronically Signed   By: Franki Cabot M.D.   On: 09/09/2021 20:00   DG Chest Portable 1 View  Result Date: 09/09/2021 CLINICAL DATA:  Shortness of breath. EXAM: PORTABLE CHEST 1 VIEW COMPARISON:  Chest x-ray 09/09/2021 FINDINGS: The heart is enlarged, unchanged. There is minimal atelectasis in the lower lungs. There is no lung consolidation, pleural effusion or pneumothorax. Mediastinal silhouette appears stable. There are no acute fractures. IMPRESSION: 1. Cardiomegaly. 2. No evidence for pneumonia or edema. Electronically Signed   By: Ronney Asters M.D.   On: 09/09/2021 18:33   DG Chest Port 1 View  Result Date: 09/09/2021 CLINICAL DATA:  Possible sepsis EXAM: PORTABLE CHEST 1 VIEW COMPARISON:  CT 10/03/2014 FINDINGS: No focal  consolidation or pleural effusion. Mild cardiomegaly. Streaky atelectasis or scarring at the bases. No visible pneumothorax. IMPRESSION: 1. Cardiomegaly without acute focal airspace disease 2. Streaky atelectasis or scarring at the bases. Electronically Signed   By: Donavan Foil M.D.   On: 09/09/2021 17:08   CT CHEST ABDOMEN PELVIS WO CONTRAST  Result Date: 09/09/2021 CLINICAL DATA:  Sepsis and abdominal pain. EXAM: CT CHEST, ABDOMEN AND PELVIS WITHOUT CONTRAST TECHNIQUE: Multidetector CT imaging of the chest, abdomen and pelvis was performed following the standard protocol without IV contrast. COMPARISON:  CT chest 10/03/2014.  CT abdomen and pelvis 09/04/2010. FINDINGS: CT CHEST FINDINGS Cardiovascular: The heart is mildly enlarged. Aorta is normal in size. There are atherosclerotic calcifications of the aorta and coronary arteries. There is no pericardial effusion. Mediastinum/Nodes: No enlarged  mediastinal, hilar, or axillary lymph nodes. Thyroid gland, trachea, and esophagus demonstrate no significant findings. Lungs/Pleura: There is smooth interlobular septal thickening in the lung apices. There is patchy airspace disease and atelectasis in the bilateral lower lobes. There are small bilateral pleural effusions, right greater than left. There is also some airspace disease in the dependent portions of the bilateral upper lobes. There is no pneumothorax. Trachea and central airways are patent. Musculoskeletal: No chest wall mass or suspicious bone lesions identified. No acute fractures. CT ABDOMEN PELVIS FINDINGS Hepatobiliary: No focal liver abnormality is seen. No gallstones, gallbladder wall thickening, or biliary dilatation. Pancreas: Unremarkable. No pancreatic ductal dilatation or surrounding inflammatory changes. Spleen: Normal in size without focal abnormality. Adrenals/Urinary Tract: The bilateral adrenal glands are within normal limits. There are numerous bilateral cysts and hypodensities which are too small to characterize. These have increased in size and number compared to the prior study. The largest cyst on the left measures 4.5 cm. The largest cyst on the right measures 2.7 cm. There are additional mildly hyperdense lesions which are too small to characterize in both kidneys. There is no hydronephrosis. No urinary tract calculi are seen. Bladder is within normal limits. Stomach/Bowel: Stomach is within normal limits. Appendix appears normal. No evidence of bowel wall thickening, distention, or inflammatory changes. Vascular/Lymphatic: No significant vascular findings are present. No enlarged abdominal or pelvic lymph nodes. Reproductive: Prostate gland is mildly enlarged. Other: There is a small right inguinal hernia containing nondilated bowel. There is a small fat containing left inguinal hernia. There is no ascites or free air. Musculoskeletal: Degenerative changes affect the spine.  Sclerotic density in the left iliac bone measures 11 mm and has increased in size compared to 2011. IMPRESSION: 1. Small bilateral pleural effusions. 2. Bilateral upper lobe and lower lobe airspace disease may represent infection or edema. 3. Cardiomegaly. 4. Bilateral renal cysts. Additional hyperdense areas in both kidneys are too small to characterize on this study and indeterminate, possibly complex cysts. These can be further evaluated with MRI as clinically warranted. 5. Right inguinal hernia containing bowel.  No bowel obstruction. 6. Small sclerotic osseous lesion in the left pelvis has minimally increased in size compared to 2011. Electronically Signed   By: Ronney Asters M.D.   On: 09/09/2021 20:10        Scheduled Meds:  hydrALAZINE  10 mg Intravenous Once   Continuous Infusions:  lactated ringers 75 mL/hr at 09/09/21 2311     LOS: 1 day   Time spent= 35 mins    Lena Gores Arsenio Loader, MD [facility]  If 7PM-7AM, please contact night-coverage  09/10/2021, 12:34 PM

## 2021-09-10 NOTE — ED Notes (Signed)
Patient remains agitated with any movement or touch.

## 2021-09-10 NOTE — Consult Note (Signed)
Consultation Note Date: 09/10/2021   Patient Name: Derek Schneider  DOB: 1926/04/28  MRN: [id number]  Age / Sex: 87 y.o., male  PCP: Hayden Rasmussen, MD Referring Physician: Rise Patience, MD  Reason for Consultation: Terminal Care  HPI/Patient Profile: 87 y.o. male  with past medical history of CHF, A. fib on Xarelto, hypertension admitted on 09/09/2021 after being seen at his PCP office and sent for infusion.  He was found to have intraparenchymal hemorrhage in the right frontal lobe with 5 cm hemorrhage with 12 mm midline shift.  He was transition to comfort care and palliative consulted for assistance with terminal care.  Clinical Assessment and Goals of Care: Palliative care consult received.  Chart reviewed including personal review of pertinent labs and imaging.  I met today with multiple family members including patient's wife and 2 daughters.  We were later joined by his son and his daughter-in-law.  Patient's wife has memory impairment and while family had agreed with plan for full comfort, they had not discussed the severity of his situation and limited prognosis with her (at least that she could remember).  I therefore talked with her about his presentation, clinical course, and recommendation/plan for comfort moving forward.  She was appropriately distraught but in agreement that this would the best thing moving forward.  She did not recall this conversation a few minutes later, however, and we ended up reviewing multiple times as his other family members entered the room.  We discussed continuing with any interventions to promote comfort and dignity as he approaches end-of-life including treatment of pain, anxiety, shortness of breath, nausea, and agitation.  Family reports that he has had periods of agitation that did seem to improve with Ativan and we discussed scheduling Ativan moving  forward.  We also discussed stopping things such as lab draws and IV fluids that would not like to add to his comfort but were likely to cause him to feel worse rather than better.  SUMMARY OF RECOMMENDATIONS   -DNR/DNI -Full comfort moving forward.  Discussed plan for comfort care in detail with family including medications, symptoms of distress, and prognosis. -This will likely be a hospital death.  If he were to stabilize, could consider residential hospice, but I think this is doubtful at this point.  Code Status/Advance Care Planning: DNR/DNI  Symptom Management:  Pain: Morphine as needed Anxiety: Scheduled Ativan every 4 hours.  Additional Ativan every 2 hours as needed for breakthrough symptoms Agitation: Haldol as needed Excess secretions: Robinul as needed  Prognosis:  Hours - Days  Discharge Planning: Anticipated Hospital Death      Primary Diagnoses: Present on Admission:  Atrial fibrillation [facility])  Essential hypertension   I have reviewed the medical record, interviewed the patient and family, and examined the patient. The following aspects are pertinent.  Past Medical History:  Diagnosis Date   Acoustic neuroma ([place])    Right ear   Aortic insufficiency    Asthma    Bronchitis, not specified as acute or  chronic    Chronic diastolic CHF (congestive heart failure) (HCC)    Diverticulosis of colon (without mention of hemorrhage)    Hiatal hernia    Internal hemorrhoids without mention of complication    Mild CAD    Mitral regurgitation    Persistent atrial fibrillation (HCC)    Personal history of colonic polyps 01/12/2008   TUBULOVILLOUS ADENOMA   PVC (premature ventricular contraction)    Stricture and stenosis of esophagus    Unspecified essential hypertension    Unspecified gastritis and gastroduodenitis without mention of hemorrhage    Wears contact lenses    right eye   Social History   Socioeconomic History   Marital status: Married     Spouse name: Not on file   Number of children: 5   Years of education: Not on file   Highest education level: Not on file  Occupational History   Occupation: Self employed    Employer: RETIRED  Tobacco Use   Smoking status: Never   Smokeless tobacco: Never  Vaping Use   Vaping Use: Never used  Substance and Sexual Activity   Alcohol use: No   Drug use: No   Sexual activity: Not on file  Other Topics Concern   Not on file  Social History Narrative   Not on file   Social Determinants of Health   Financial Resource Strain: Not on file  Food Insecurity: Not on file  Transportation Needs: Not on file  Physical Activity: Not on file  Stress: Not on file  Social Connections: Not on file   Family History  Problem Relation Age of Onset   Lung cancer Brother    Heart disease Father    Kidney disease Father    Cancer Brother    Scheduled Meds:  hydrALAZINE  10 mg Intravenous Once   LORazepam       LORazepam       LORazepam  1 mg Intravenous Q4H   Continuous Infusions: PRN Meds:.acetaminophen **OR** acetaminophen, albuterol, atropine, diphenhydrAMINE, glycopyrrolate, haloperidol **OR** haloperidol **OR** haloperidol lactate, hydrALAZINE, LORazepam **OR** LORazepam **OR** LORazepam, morphine injection, morphine CONCENTRATE **OR** morphine CONCENTRATE, ondansetron **OR** ondansetron (ZOFRAN) IV, polyvinyl alcohol, senna Medications Prior to Admission:  Prior to Admission medications   Medication Sig Start Date End Date Taking? Authorizing Provider  Rivaroxaban (XARELTO) 15 MG TABS tablet TAKE 1 TABLET BY MOUTH ONCE DAILY WITH SUPPER Patient taking differently: 15 mg daily with supper. 04/05/21  Yes Nahser, Wonda Cheng, MD  albuterol (VENTOLIN HFA) 108 (90 Base) MCG/ACT inhaler Inhale 1 puff into the lungs every 6 (six) hours as needed for wheezing or shortness of breath.    [provider]  cholecalciferol (VITAMIN D) 1000 UNITS tablet Take 1,000 Units by mouth daily.     [provider]  DEXILANT 60 MG capsule TAKE 1 CAPSULE BY MOUTH ONCE DAILY BEFORE BREAKFAST Patient taking differently: Take 60 mg by mouth daily before breakfast. 05/04/19   Milus Banister, MD  diltiazem (CARDIZEM CD) 120 MG 24 hr capsule Take 1 capsule (120 mg total) by mouth daily. 02/01/21   Nahser, Wonda Cheng, MD  diltiazem (CARDIZEM) 30 MG tablet TAKE 1 TABLET BY MOUTH AT BEDTIME AS NEEDED (FAST  HEART  RATE/ATRIAL  FIB) Patient taking differently: Take 30 mg by mouth at bedtime as needed (fast heart rate/atrial fib). 03/27/21   Nahser, Wonda Cheng, MD  doxycycline (VIBRA-TABS) 100 MG tablet Take 1 tablet (100 mg total) by mouth 2 (two) times daily. 05/31/21  Felipa Furnace, DPM  finasteride (PROSCAR) 5 MG tablet Take 5 mg by mouth daily.     [provider]  fluticasone furoate-vilanterol (BREO ELLIPTA) 200-25 MCG/ACT AEPB Inhale 1 puff into the lungs daily.    [provider]  furosemide (LASIX) 20 MG tablet Take 1 tablet by mouth once daily Patient taking differently: Take 20 mg by mouth daily. 07/22/21   Nahser, Wonda Cheng, MD  gabapentin (NEURONTIN) 100 MG capsule Take 100-200 mg by mouth See admin instructions. Ordered 07/23/21 - take one capsule (100 mg) by mouth daily at bedtime for 5 days, then increase to two capsules (200 mg) at bedtime    [provider]  losartan (COZAAR) 100 MG tablet Take 0.5 tablets (50 mg total) by mouth 2 (two) times daily. 06/20/21   Dunn, Nedra Hai, PA-C  nebivolol (BYSTOLIC) 2.5 MG tablet Take 1 tablet (2.5 mg total) by mouth daily. 04/03/21   Dunn, Nedra Hai, PA-C  Tamsulosin HCl (FLOMAX) 0.4 MG CAPS Take 0.4 mg by mouth every evening.     [provider]  triamcinolone (KENALOG) 0.1 % Apply 1 application topically 2 (two) times daily as needed (itching/rash). 10/08/20   [provider]   No Known Allergies Review of Systems Unable to obtain  Physical Exam General: Somnolent HEENT: No bruits, no goiter, no  JVD Heart: Regular rate and rhythm. No murmur appreciated. Lungs: Decreased air movement, clear Ext: No significant edema Skin: Warm and dry  Vital Signs: BP (!) 154/102 (BP Location: Left Arm)   Pulse 63   Temp 98 F (36.7 C) (Oral)   Resp 20   Ht 6' (1.829 m)   Wt 75.3 kg   SpO2 98%   BMI 22.51 kg/m  Pain Scale: PAINAD       SpO2: SpO2: 98 % O2 Device:SpO2: 98 % O2 Flow Rate: .   IO: Intake/output summary:  Intake/Output Summary (Last 24 hours) at 09/10/2021 2206 Last data filed at 09/10/2021 1645 Gross per 24 hour  Intake 1275 ml  Output --  Net 1275 ml    LBM: Last BM Date:  (UTA) Baseline Weight: Weight: 75.3 kg Most recent weight: Weight: 75.3 kg     Palliative Assessment/Data:   Flowsheet Rows    Flowsheet Row Most Recent Value  Intake Tab   Referral Department Hospitalist  Unit at Time of Referral Oncology Unit  Palliative Care Primary Diagnosis Neurology  Date Notified 09/10/21  Palliative Care Type New Palliative care  Reason for referral End of Life Care Assistance  Date of Admission 09/09/21  Date first seen by Palliative Care 09/10/21  # of days Palliative referral response time 0 Day(s)  # of days IP prior to Palliative referral 1  Clinical Assessment   Palliative Performance Scale Score 10%  Psychosocial & Spiritual Assessment   Palliative Care Outcomes   Patient/Family meeting held? Yes  Who was at the meeting? Wife, daughters, son, daughter in law       Time In: 72 Time Out: 1825 Time Total: 104 Greater than 50%  of this time was spent counseling and coordinating care related to the above assessment and plan.  Signed by: Micheline Rough, MD   Please contact Palliative Medicine Team phone at [phone] for questions and concerns.  For individual provider: See Shea Evans

## 2021-09-10 NOTE — Progress Notes (Signed)
RT spoke with the doctor about taking the Pt off bipap. The Pt's vital sigh were good and the bipap seemed to make the pt more agitated. The doctor stated that wwe can try the Pt off just make sure his oxygen is good and his breathing is good. Rt will continue to monitor

## 2021-09-11 ENCOUNTER — Other Ambulatory Visit: Payer: Self-pay

## 2021-09-11 DIAGNOSIS — I629 Nontraumatic intracranial hemorrhage, unspecified: Secondary | ICD-10-CM | POA: Diagnosis not present

## 2021-09-11 DIAGNOSIS — Z7189 Other specified counseling: Secondary | ICD-10-CM | POA: Diagnosis not present

## 2021-09-11 DIAGNOSIS — R4182 Altered mental status, unspecified: Secondary | ICD-10-CM | POA: Diagnosis not present

## 2021-09-11 DIAGNOSIS — Z515 Encounter for palliative care: Secondary | ICD-10-CM | POA: Diagnosis not present

## 2021-09-11 MED ORDER — DEXTROSE IN LACTATED RINGERS 5 % IV SOLN: INTRAVENOUS | Status: DC

## 2021-09-11 MED ORDER — ORAL CARE MOUTH RINSE
15.0000 mL | Freq: Two times a day (BID) | OROMUCOSAL | Status: DC
  Administered 2021-09-11 – 2021-09-13 (×4): 15 mL via OROMUCOSAL

## 2021-09-11 MED ORDER — LABETALOL HCL 5 MG/ML IV SOLN
5.0000 mg | INTRAVENOUS | Status: DC | PRN
  Filled 2021-09-11: qty 4

## 2021-09-11 NOTE — Progress Notes (Signed)
PROGRESS NOTE    Derek Schneider  PQZ:[id number] DOB: December 10, 1925 DOA: 09/09/2021 PCP: Hayden Rasmussen, MD   Brief Narrative:  87 year old with history of CHF, A. fib on Xarelto, HTN admitted for confusion from PCP office.  He was found to have intraparenchymal hemorrhage in the right frontal lobe with 5 cm large midline shift effect.  Patient was eventually made comfort care as no further treatment option was available by neurosurgery.  09/11/2021: Patient was seen with his daughter at his bedside.  Unresponsive.  Comfort care measures in place.  Anticipated hospital day.   Assessment & Plan:   Principal Problem:   Intracranial bleeding (HCC) Active Problems:   Essential hypertension   Hx of gastroesophageal reflux (GERD)   Atrial fibrillation (HCC)  Intracranial hemorrhage with midline shift, 5 cm bleed with 91m left side shift - Frontal brain bleed with midline shift.  Case discussed by admitting provider with neurosurgeon, not a surgical candidate.  Patient will be transitioned to comfort care  Other medical issues: Sepsis possibly from pneumonia Essential hypertension Atrial fibrillation, permanent Congestive heart failure   At this time patient has extremely poor prognosis, anticipate in-hospital death.  I met with the daughter at bedside who is in agreement to transition patient to comfort measures.    DVT prophylaxis: SCDs Code Status: DNR comfort care Family Communication: Met with daughter at bedside  Examination: Unchanged from prior exam.  Constitutional: Remains unresponsive Respiratory: Clear to auscultation bilaterally Cardiovascular: Normal sinus rhythm, no rubs Abdomen: Nontender nondistended Musculoskeletal: No edema noted Skin: No rashes seen Neurologic: Unable to assess Psychiatric: Unable to assess  Objective: Vitals:   09/10/21 1030 09/10/21 1400 09/10/21 1444 09/11/21 0445  BP: (!) 151/92 (!) 168/127 (!) 154/102 (!) 153/89  Pulse: 97 (!)  108 63 74  Resp: (!) 21 (!) '22 20 15  ' Temp:   98 F (36.7 C) 97.6 F (36.4 C)  TempSrc:   Oral Oral  SpO2: 96% 97% 98% 99%  Weight:      Height:        Intake/Output Summary (Last 24 hours) at 09/11/2021 1425 Last data filed at 09/10/2021 1645 Gross per 24 hour  Intake 1275 ml  Output --  Net 1275 ml   Filed Weights   09/09/21 1605  Weight: 75.3 kg     Data Reviewed:   CBC: Recent Labs  Lab 09/09/21 1606 09/10/21 0457  WBC 12.1* 12.1*  NEUTROABS 10.6*  --   HGB 13.3 12.5*  HCT 39.8 37.9*  MCV 90.2 89.6  PLT 184 1335  Basic Metabolic Panel: Recent Labs  Lab 09/09/21 1606 09/10/21 0457  NA 135 135  K 3.8 4.0  CL 101 104  CO2 23 24  GLUCOSE 120* 101*  BUN 24* 26*  CREATININE 1.17 1.13  CALCIUM 8.6* 8.5*   GFR: Estimated Creatinine Clearance: 43.5 mL/min (by C-G formula based on SCr of 1.13 mg/dL). Liver Function Tests: Recent Labs  Lab 09/09/21 1606 09/10/21 0457  AST 31 33  ALT 21 22  ALKPHOS 65 62  BILITOT 1.8* 1.9*  PROT 7.2 6.2*  ALBUMIN 3.9 3.3*   No results for input(s): LIPASE, AMYLASE in the last 168 hours. No results for input(s): AMMONIA in the last 168 hours. Coagulation Profile: Recent Labs  Lab 09/09/21 1606  INR 1.1   Cardiac Enzymes: No results for input(s): CKTOTAL, CKMB, CKMBINDEX, TROPONINI in the last 168 hours. BNP (last 3 results) Recent Labs    02/26/21 0858  PROBNP  2,005*   HbA1C: No results for input(s): HGBA1C in the last 72 hours. CBG: Recent Labs  Lab 09/10/21 0017  GLUCAP 118*   Lipid Profile: No results for input(s): CHOL, HDL, LDLCALC, TRIG, CHOLHDL, LDLDIRECT in the last 72 hours. Thyroid Function Tests: No results for input(s): TSH, T4TOTAL, FREET4, T3FREE, THYROIDAB in the last 72 hours. Anemia Panel: No results for input(s): VITAMINB12, FOLATE, FERRITIN, TIBC, IRON, RETICCTPCT in the last 72 hours. Sepsis Labs: Recent Labs  Lab 09/09/21 1616  LATICACIDVEN 1.8    Recent Results (from  the past 240 hour(s))  Blood Culture (routine x 2)     Status: None (Preliminary result)   Collection Time: 09/09/21  4:06 PM   Specimen: BLOOD  Result Value Ref Range Status   Specimen Description   Final    BLOOD BLOOD RIGHT FOREARM Performed at [facility] [address], [place], [place] [address]    Special Requests   Final    BOTTLES DRAWN AEROBIC AND ANAEROBIC Blood Culture adequate volume Performed at [facility] [address], [place], [place] [address]    Culture   Final    NO GROWTH 2 DAYS Performed at [facility] [address] [place], [place] [address]    Report Status PENDING  Incomplete  Blood Culture (routine x 2)     Status: None (Preliminary result)   Collection Time: 09/09/21  4:11 PM   Specimen: BLOOD  Result Value Ref Range Status   Specimen Description   Final    BLOOD RIGHT ANTECUBITAL Performed at [facility] [address], [place], [place] [address]    Special Requests   Final    BOTTLES DRAWN AEROBIC AND ANAEROBIC Blood Culture results may not be optimal due to an excessive volume of blood received in culture bottles Performed at [facility] [address] [place], [place] [address]    Culture   Final    NO GROWTH 2 DAYS Performed at [facility] [address] [place], [place] [address]    Report Status PENDING  Incomplete  Resp Panel by RT-PCR (Flu A&B, Covid)     Status: None   Collection Time: 09/09/21  4:37 PM   Specimen: Nasopharyngeal(NP) swabs in vial transport medium  Result Value Ref Range Status   SARS Coronavirus 2 by RT PCR NEGATIVE NEGATIVE Final    Comment: (NOTE) SARS-CoV-2 target nucleic acids are NOT DETECTED.  The SARS-CoV-2 RNA is generally detectable in upper respiratory specimens during the acute phase of infection. The lowest concentration of SARS-CoV-2 viral copies this assay can detect is 138 copies/mL. A negative  result does not preclude SARS-Cov-2 infection and should not be used as the sole basis for treatment or other patient management decisions. A negative result may occur with  improper specimen collection/handling, submission of specimen other than nasopharyngeal swab, presence of viral mutation(s) within the areas targeted by this assay, and inadequate number of viral copies(<138 copies/mL). A negative result must be combined with clinical observations, patient history, and epidemiological information. The expected result is Negative.  Fact Sheet for Patients:  EntrepreneurPulse.com.au  Fact Sheet for Healthcare Providers:  IncredibleEmployment.be  This test is no t yet approved or cleared by the [place] FDA and  has been authorized for detection and/or diagnosis of SARS-CoV-2 by FDA under an Emergency Use Authorization (EUA). This EUA will remain  in effect (meaning this test can be used) for the  duration of the COVID-19 declaration under Section 564(b)(1) of the Act, 21 U.S.C.section 360bbb-3(b)(1), unless the authorization is terminated  or revoked sooner.       Influenza A by PCR NEGATIVE NEGATIVE Final   Influenza B by PCR NEGATIVE NEGATIVE Final    Comment: (NOTE) The Xpert Xpress SARS-CoV-2/FLU/RSV plus assay is intended as an aid in the diagnosis of influenza from Nasopharyngeal swab specimens and should not be used as a sole basis for treatment. Nasal washings and aspirates are unacceptable for Xpert Xpress SARS-CoV-2/FLU/RSV testing.  Fact Sheet for Patients: EntrepreneurPulse.com.au  Fact Sheet for Healthcare Providers: IncredibleEmployment.be  This test is not yet approved or cleared by the [place] FDA and has been authorized for detection and/or diagnosis of SARS-CoV-2 by FDA under an Emergency Use Authorization (EUA). This EUA will remain in effect (meaning this test can be used)  for the duration of the COVID-19 declaration under Section 564(b)(1) of the Act, 21 U.S.C. section 360bbb-3(b)(1), unless the authorization is terminated or revoked.  Performed at [facility], [place] [address] [place], [place] [address]   Urine Culture     Status: Abnormal   Collection Time: 09/09/21  4:37 PM   Specimen: In/Out Cath Urine  Result Value Ref Range Status   Specimen Description   Final    IN/OUT CATH URINE Performed at [facility] [address] [place], [place] [address]    Special Requests   Final    NONE Performed at [facility], [place] [address], [place],  [address]    Culture MULTIPLE SPECIES PRESENT, SUGGEST RECOLLECTION (A)  Final   Report Status 09/10/2021 FINAL  Final         Radiology Studies: CT HEAD WO CONTRAST (5MM)  Result Date: 09/09/2021 CLINICAL DATA:  Delirium, altered mental status, sepsis. EXAM: CT HEAD WITHOUT CONTRAST TECHNIQUE: Contiguous axial images were obtained from the base of the skull through the vertex without intravenous contrast. COMPARISON:  None. FINDINGS: Brain: Acute parenchymal hemorrhage within the RIGHT frontal lobe, measuring approximally 5.3 cm greatest dimension, with surrounding parenchymal edema. The combination of hemorrhage and edema is causing mass effect on the adjacent parenchyma, effacement of the frontal horn of the RIGHT lateral ventricle, and approximately 12 mm leftward midline shift. No evidence of transtentorial or tonsillar herniation. Small amount of acute hemorrhage layering within the occipital horns of each ventricle. Vascular: Chronic calcified atherosclerotic changes of the large vessels at the skull base. No unexpected hyperdense vessel. Skull: Normal. Negative for fracture or focal lesion. Sinuses/Orbits: No acute finding. Other: None. IMPRESSION: 1. Acute large parenchymal hemorrhage within the RIGHT frontal lobe, measuring approximally 5  cm greatest dimension, with surrounding parenchymal edema, most likely hypertensive or hemorrhagic infarct. The combination of hemorrhage and edema is causing mass effect on the adjacent frontal lobe parenchyma, effacement of the frontal horn of the RIGHT lateral ventricle, and approximately 12 mm leftward midline shift. 2. Small amount of associated acute hemorrhage layering within the occipital horns of each ventricle. Critical Value/emergent results were called by telephone at the time of interpretation on 09/09/2021 at 7:58 pm to provider [facility] , who verbally acknowledged these results. Electronically Signed   By: Franki Cabot M.D.   On: 09/09/2021 20:00   DG Chest Portable 1 View  Result Date: 09/09/2021 CLINICAL DATA:  Shortness of breath. EXAM: PORTABLE CHEST 1 VIEW COMPARISON:  Chest x-ray 09/09/2021 FINDINGS: The heart is enlarged, unchanged. There is minimal atelectasis in the  lower lungs. There is no lung consolidation, pleural effusion or pneumothorax. Mediastinal silhouette appears stable. There are no acute fractures. IMPRESSION: 1. Cardiomegaly. 2. No evidence for pneumonia or edema. Electronically Signed   By: Ronney Asters M.D.   On: 09/09/2021 18:33   DG Chest Port 1 View  Result Date: 09/09/2021 CLINICAL DATA:  Possible sepsis EXAM: PORTABLE CHEST 1 VIEW COMPARISON:  CT 10/03/2014 FINDINGS: No focal consolidation or pleural effusion. Mild cardiomegaly. Streaky atelectasis or scarring at the bases. No visible pneumothorax. IMPRESSION: 1. Cardiomegaly without acute focal airspace disease 2. Streaky atelectasis or scarring at the bases. Electronically Signed   By: Donavan Foil M.D.   On: 09/09/2021 17:08   CT CHEST ABDOMEN PELVIS WO CONTRAST  Result Date: 09/09/2021 CLINICAL DATA:  Sepsis and abdominal pain. EXAM: CT CHEST, ABDOMEN AND PELVIS WITHOUT CONTRAST TECHNIQUE: Multidetector CT imaging of the chest, abdomen and pelvis was performed following the standard protocol  without IV contrast. COMPARISON:  CT chest 10/03/2014.  CT abdomen and pelvis 09/04/2010. FINDINGS: CT CHEST FINDINGS Cardiovascular: The heart is mildly enlarged. Aorta is normal in size. There are atherosclerotic calcifications of the aorta and coronary arteries. There is no pericardial effusion. Mediastinum/Nodes: No enlarged mediastinal, hilar, or axillary lymph nodes. Thyroid gland, trachea, and esophagus demonstrate no significant findings. Lungs/Pleura: There is smooth interlobular septal thickening in the lung apices. There is patchy airspace disease and atelectasis in the bilateral lower lobes. There are small bilateral pleural effusions, right greater than left. There is also some airspace disease in the dependent portions of the bilateral upper lobes. There is no pneumothorax. Trachea and central airways are patent. Musculoskeletal: No chest wall mass or suspicious bone lesions identified. No acute fractures. CT ABDOMEN PELVIS FINDINGS Hepatobiliary: No focal liver abnormality is seen. No gallstones, gallbladder wall thickening, or biliary dilatation. Pancreas: Unremarkable. No pancreatic ductal dilatation or surrounding inflammatory changes. Spleen: Normal in size without focal abnormality. Adrenals/Urinary Tract: The bilateral adrenal glands are within normal limits. There are numerous bilateral cysts and hypodensities which are too small to characterize. These have increased in size and number compared to the prior study. The largest cyst on the left measures 4.5 cm. The largest cyst on the right measures 2.7 cm. There are additional mildly hyperdense lesions which are too small to characterize in both kidneys. There is no hydronephrosis. No urinary tract calculi are seen. Bladder is within normal limits. Stomach/Bowel: Stomach is within normal limits. Appendix appears normal. No evidence of bowel wall thickening, distention, or inflammatory changes. Vascular/Lymphatic: No significant vascular findings  are present. No enlarged abdominal or pelvic lymph nodes. Reproductive: Prostate gland is mildly enlarged. Other: There is a small right inguinal hernia containing nondilated bowel. There is a small fat containing left inguinal hernia. There is no ascites or free air. Musculoskeletal: Degenerative changes affect the spine. Sclerotic density in the left iliac bone measures 11 mm and has increased in size compared to 2011. IMPRESSION: 1. Small bilateral pleural effusions. 2. Bilateral upper lobe and lower lobe airspace disease may represent infection or edema. 3. Cardiomegaly. 4. Bilateral renal cysts. Additional hyperdense areas in both kidneys are too small to characterize on this study and indeterminate, possibly complex cysts. These can be further evaluated with MRI as clinically warranted. 5. Right inguinal hernia containing bowel.  No bowel obstruction. 6. Small sclerotic osseous lesion in the left pelvis has minimally increased in size compared to 2011. Electronically Signed   By: Ronney Asters M.D.   On: 09/09/2021  20:10        Scheduled Meds:  hydrALAZINE  10 mg Intravenous Once   LORazepam  1 mg Intravenous Q4H   mouth rinse  15 mL Mouth Rinse BID   Continuous Infusions:     LOS: 2 days   Time spent= 35 mins    Kayleen Memos, MD [facility]  If 7PM-7AM, please contact night-coverage  09/11/2021, 2:25 PM

## 2021-09-11 NOTE — Progress Notes (Signed)
Daily Progress Note   Patient Name: Derek Schneider       Date: 09/11/2021 DOB: 06-19-1926  Age: 87 y.o. MRN#: [id number] Attending Physician: Derek Memos, DO Primary Care Physician: Derek Rasmussen, MD Admit Date: 09/09/2021  Reason for Consultation/Follow-up: Establishing goals of care and Terminal Care  Subjective: I saw and examined Derek Schneider earlier today and he was lying in bed in no distress.  Family was at the bedside and we discussed plan for continue comfort care.  I then received call from RN that he was little more awake interactive and family was hoping I could stop by to see him again.  I met with family and they report that, while they understand that this may not be sign that he is going to improve long-term, they were wanting to discuss thoughts on restarting gentle medical care such as hydration.  We discussed plan for continuing to focus on medications as needed to relieve symptoms while also restarting some gentle fluids and rechecking labs tomorrow.  Based upon this and his continued clinical course, we will meet again to discuss next steps.  At this time, however, plan for comfort care remains top priority.  Length of Stay: 2  Current Medications: Scheduled Meds:   hydrALAZINE  10 mg Intravenous Once   mouth rinse  15 mL Mouth Rinse BID    Continuous Infusions:  dextrose 5% lactated ringers 50 mL/hr at 09/11/21 1554    PRN Meds: acetaminophen **OR** acetaminophen, albuterol, atropine, diphenhydrAMINE, glycopyrrolate, haloperidol **OR** haloperidol **OR** haloperidol lactate, hydrALAZINE, labetalol, LORazepam **OR** LORazepam **OR** LORazepam, morphine injection, morphine CONCENTRATE **OR** morphine CONCENTRATE, ondansetron **OR** ondansetron (ZOFRAN) IV,  polyvinyl alcohol, senna  Physical Exam         General: Sleeping.  Does not arouse much for me but was noted to be talking with family earlier. HEENT: No bruits, no goiter, no JVD Heart: Regular rate and rhythm. No murmur appreciated. Lungs: Fair air movement, scattered coarse Abdomen: Soft, nontender, nondistended, positive bowel sounds.   Ext: No significant edema Skin: Warm and dry  Vital Signs: BP (!) 153/89 (BP Location: Left Arm)   Pulse 74   Temp 97.6 F (36.4 C) (Oral)   Resp 15   Ht 6' (1.829 m)   Wt 75.3 kg   SpO2 99%  BMI 22.51 kg/m  SpO2: SpO2: 99 % O2 Device: O2 Device: Nasal Cannula O2 Flow Rate: O2 Flow Rate (L/min): 2 L/min  Intake/output summary:  Intake/Output Summary (Last 24 hours) at 09/11/2021 1902 Last data filed at 09/11/2021 1600 Gross per 24 hour  Intake 4.93 ml  Output --  Net 4.93 ml   LBM: Last BM Date:  (uta) Baseline Weight: Weight: 75.3 kg Most recent weight: Weight: 75.3 kg       Palliative Assessment/Data:    Flowsheet Rows    Flowsheet Row Most Recent Value  Intake Tab   Referral Department Hospitalist  Unit at Time of Referral Oncology Unit  Palliative Care Primary Diagnosis Neurology  Date Notified 09/10/21  Palliative Care Type New Palliative care  Reason for referral End of Life Care Assistance  Date of Admission 09/09/21  Date first seen by Palliative Care 09/10/21  # of days Palliative referral response time 0 Day(s)  # of days IP prior to Palliative referral 1  Clinical Assessment   Palliative Performance Scale Score 10%  Psychosocial & Spiritual Assessment   Palliative Care Outcomes   Patient/Family meeting held? Yes  Who was at the meeting? Wife, daughters, son, daughter in law       Patient Active Problem List   Diagnosis Date Noted   Intracranial bleeding ([place]) 09/09/2021   Atrial fibrillation ([place]) 05/03/2019   Chest pain, atypical 11/17/2011   Hx of gastroesophageal reflux (GERD) 11/17/2011    GERD 06/29/2008   Essential hypertension 06/28/2008   ESOPHAGEAL STRICTURE 06/28/2008   HIATAL HERNIA 06/28/2008   DIVERTICULOSIS, COLON 06/28/2008    Palliative Care Assessment & Plan   Patient Profile: 87 y.o. male  with past medical history of CHF, A. fib on Xarelto, hypertension admitted on 09/09/2021 after being seen at his PCP office and sent for infusion.  He was found to have intraparenchymal hemorrhage in the right frontal lobe with 5 cm hemorrhage with 12 mm midline shift.  He was transition to comfort care and palliative consulted for assistance with terminal care.  Recommendations/Plan: Derek Schneider is noted to be more interactive with family today.  They understand that he remains critically ill and this may not be signs of actual long-term improvement but they do want to restart some gentle medical interventions and reassess his situation again tomorrow.  I discussed with Dr. [place] Crane and changes made as follows: Restart gentle hydration Plan to check basic labs tomorrow morning Will change scheduled Ativan to as needed Restart blood pressure medication to be given if systolic greater than 259 Overall, primary goal remains to ensure that he is not suffering.  Medications for comfort should still be given if needed.  Goals of Care and Additional Recommendations: Limitations on Scope of Treatment: Full Comfort Care  Code Status:    Code Status Orders  (From admission, onward)           Start     Ordered   09/10/21 1213  Do not attempt resuscitation (DNR)  Continuous       Question Answer Comment  In the event of cardiac or respiratory ARREST Do not call a "code blue"   In the event of cardiac or respiratory ARREST Do not perform Intubation, CPR, defibrillation or ACLS   In the event of cardiac or respiratory ARREST Use medication by any route, position, wound care, and other measures to relive pain and suffering. May use oxygen, suction and manual treatment of airway  obstruction as needed for comfort.  09/10/21 1214           Code Status History     Date Active Date Inactive Code Status Order ID Comments User Context   09/09/2021 2245 09/10/2021 1214 DNR [id number]  Rise Patience, MD ED   09/09/2021 1813 09/09/2021 2245 DNR [id number]  Fredia Sorrow, MD ED      Advance Directive Documentation    Flowsheet Row Most Recent Value  Type of Advance Directive Out of facility DNR (pink MOST or yellow form)  Pre-existing out of facility DNR order (yellow form or pink MOST form) --  "MOST" Form in Place? --       Prognosis:  Hours - Days  Discharge Planning: To Be Determined  Care plan was discussed with family, Dr. [place] Crane  Thank you for allowing the Palliative Medicine Team to assist in the care of this patient.   Total Time 50 minutes over 2 encounters Prolonged Time Billed No      Greater than 50%  of this time was spent counseling and coordinating care related to the above assessment and plan.  Micheline Rough, MD  Please contact Palliative Medicine Team phone at [phone] for questions and concerns.

## 2021-09-12 ENCOUNTER — Inpatient Hospital Stay (HOSPITAL_COMMUNITY): Payer: Medicare Other

## 2021-09-12 DIAGNOSIS — Z7189 Other specified counseling: Secondary | ICD-10-CM | POA: Diagnosis not present

## 2021-09-12 DIAGNOSIS — Z515 Encounter for palliative care: Secondary | ICD-10-CM | POA: Diagnosis not present

## 2021-09-12 DIAGNOSIS — R4182 Altered mental status, unspecified: Secondary | ICD-10-CM | POA: Diagnosis not present

## 2021-09-12 DIAGNOSIS — I629 Nontraumatic intracranial hemorrhage, unspecified: Secondary | ICD-10-CM | POA: Diagnosis not present

## 2021-09-12 LAB — CBC WITH DIFFERENTIAL/PLATELET
Abs Immature Granulocytes: 0.06 10*3/uL (ref 0.00–0.07)
Basophils Absolute: 0 10*3/uL (ref 0.0–0.1)
Basophils Relative: 0 %
Eosinophils Absolute: 0 10*3/uL (ref 0.0–0.5)
Eosinophils Relative: 0 %
HCT: 41.5 % (ref 39.0–52.0)
Hemoglobin: 14 g/dL (ref 13.0–17.0)
Immature Granulocytes: 1 %
Lymphocytes Relative: 4 %
Lymphs Abs: 0.5 10*3/uL — ABNORMAL LOW (ref 0.7–4.0)
MCH: 29.7 pg (ref 26.0–34.0)
MCHC: 33.7 g/dL (ref 30.0–36.0)
MCV: 87.9 fL (ref 80.0–100.0)
Monocytes Absolute: 1.3 10*3/uL — ABNORMAL HIGH (ref 0.1–1.0)
Monocytes Relative: 11 %
Neutro Abs: 9.9 10*3/uL — ABNORMAL HIGH (ref 1.7–7.7)
Neutrophils Relative %: 84 %
Platelets: 164 10*3/uL (ref 150–400)
RBC: 4.72 MIL/uL (ref 4.22–5.81)
RDW: 13.8 % (ref 11.5–15.5)
WBC: 11.8 10*3/uL — ABNORMAL HIGH (ref 4.0–10.5)
nRBC: 0 % (ref 0.0–0.2)

## 2021-09-12 LAB — COMPREHENSIVE METABOLIC PANEL
ALT: 16 U/L (ref 0–44)
AST: 20 U/L (ref 15–41)
Albumin: 2.9 g/dL — ABNORMAL LOW (ref 3.5–5.0)
Alkaline Phosphatase: 61 U/L (ref 38–126)
Anion gap: 6 (ref 5–15)
BUN: 33 mg/dL — ABNORMAL HIGH (ref 8–23)
CO2: 22 mmol/L (ref 22–32)
Calcium: 8.3 mg/dL — ABNORMAL LOW (ref 8.9–10.3)
Chloride: 109 mmol/L (ref 98–111)
Creatinine, Ser: 1.2 mg/dL (ref 0.61–1.24)
GFR, Estimated: 56 mL/min — ABNORMAL LOW (ref >60)
Glucose, Bld: 107 mg/dL — ABNORMAL HIGH (ref 70–99)
Potassium: 3.7 mmol/L (ref 3.5–5.1)
Sodium: 137 mmol/L (ref 135–145)
Total Bilirubin: 1.1 mg/dL (ref 0.3–1.2)
Total Protein: 5.5 g/dL — ABNORMAL LOW (ref 6.5–8.1)

## 2021-09-12 LAB — PROTIME-INR
INR: 1 (ref 0.8–1.2)
Prothrombin Time: 13.5 seconds (ref 11.4–15.2)

## 2021-09-12 LAB — PHOSPHORUS: Phosphorus: 2.9 mg/dL (ref 2.5–4.6)

## 2021-09-12 LAB — MAGNESIUM: Magnesium: 2.1 mg/dL (ref 1.7–2.4)

## 2021-09-12 MED ORDER — MORPHINE SULFATE (PF) 2 MG/ML IV SOLN
1.0000 mg | INTRAVENOUS | Status: DC | PRN
  Administered 2021-09-12 – 2021-09-14 (×6): 2 mg via INTRAVENOUS
  Filled 2021-09-12 (×6): qty 1

## 2021-09-12 MED ORDER — DEXTROSE IN LACTATED RINGERS 5 % IV SOLN: INTRAVENOUS | Status: AC

## 2021-09-12 NOTE — Care Management Important Message (Signed)
Important Message  Patient Details IM Letter placed in Patients room. Name: Derek Schneider MRN: [id number] Date of Birth: 01/30/1926   Medicare Important Message Given:  Yes     [name] 09/12/2021, 1:00 PM

## 2021-09-12 NOTE — Plan of Care (Signed)
  Problem: Education: Goal: Knowledge of General Education information will improve Description: Including pain rating scale, medication(s)/side effects and non-pharmacologic comfort measures Outcome: Not Progressing   Problem: Health Behavior/Discharge Planning: Goal: Ability to manage health-related needs will improve Outcome: Not Progressing   Problem: Clinical Measurements: Goal: Ability to maintain clinical measurements within normal limits will improve Outcome: Not Progressing Goal: Will remain free from infection Outcome: Not Progressing Goal: Diagnostic test results will improve Outcome: Not Progressing Goal: Respiratory complications will improve Outcome: Not Progressing Goal: Cardiovascular complication will be avoided Outcome: Not Progressing   Problem: Activity: Goal: Risk for activity intolerance will decrease Outcome: Not Progressing   Problem: Nutrition: Goal: Adequate nutrition will be maintained Outcome: Not Progressing   Problem: Coping: Goal: Level of anxiety will decrease Outcome: Not Progressing   Problem: Elimination: Goal: Will not experience complications related to bowel motility Outcome: Not Progressing Goal: Will not experience complications related to urinary retention Outcome: Not Progressing   Problem: Pain Managment: Goal: General experience of comfort will improve Outcome: Not Progressing   Problem: Safety: Goal: Ability to remain free from injury will improve Outcome: Not Progressing   Problem: Skin Integrity: Goal: Risk for impaired skin integrity will decrease Outcome: Not Progressing Pt continues on comfort care. Family at bedside. Pain medication and comfort medication given as needed. O concerns voiced. Continue plan of care.

## 2021-09-12 NOTE — Progress Notes (Signed)
PROGRESS NOTE    Darek Eifler Weisinger  OMV:[id number] DOB: 04-16-1926 DOA: 09/09/2021 PCP: Hayden Rasmussen, MD   Brief Narrative:  87 year old with history of CHF, A. fib on Xarelto, HTN admitted for confusion from PCP office.  He was found to have intraparenchymal hemorrhage in the right frontal lobe with 5 cm large midline shift effect.  Patient was eventually made comfort care as no further treatment option was available by neurosurgery.  On 09/11/2021, lab studies were ordered at family request.  CT head was repeated on 09/12/2021, no significant changes from prior.  09/12/2021: Patient was seen at his bedside with his daughter present in the room.  Minimally interactive.  Follows commands.   Assessment & Plan:   Principal Problem:   Intracranial bleeding (HCC) Active Problems:   Essential hypertension   Hx of gastroesophageal reflux (GERD)   Atrial fibrillation (HCC)  Intracranial hemorrhage with midline shift, 5 cm bleed with 32m left side shift - Frontal brain bleed with midline shift.  Case discussed by admitting provider with neurosurgeon, not a surgical candidate.  Patient transitioned to comfort care Repeat CT head on 09/12/2021 per family's request showed unchanged large right frontal parenchymal hemorrhage, regional edema, mass-effect including 1.2 cm of leftward midline shift.  Unchanged small right parietal parenchymal hemorrhage and small volume intravascular hemorrhage.  Other medical issues: Sepsis possibly from pneumonia CT chest without contrast on 09/12/2021 showed mid sternal nondisplaced body fracture.  No pneumothorax or other complicating feature.  Essential hypertension IV labetalol as needed with parameters  Atrial fibrillation, permanent Congestive heart failure   Daughters at bedside, ongoing discussion with palliative care team.  DVT prophylaxis: SCDs Code Status: DNR comfort care Family Communication: Met with daughters at bedside  Examination:    Constitutional: Frail-appearing, minimally interactive.  Follows commands. Respiratory: Clear to auscultation with no wheezes or rales. Cardiovascular: Regular rate and rhythm no rubs or gallops.   Abdomen: Nontender bowel sounds hypoactive. Musculoskeletal: No lower extremity edema bilaterally. Skin: No rashes seen. Neurologic: Minimally responsive.  Follows commands.   Psychiatric: Unable to assess due to obtundation.  Objective: Vitals:   09/10/21 1400 09/10/21 1444 09/11/21 0445 09/12/21 0515  BP: (!) 168/127 (!) 154/102 (!) 153/89 (!) 155/96  Pulse: (!) 108 63 74 (!) 111  Resp: (!) _0 (!) 25  Temp:  98 F (36.7 C) 97.6 F (36.4 C) 98 F (36.7 C)  TempSrc:  Oral Oral Oral  SpO2: 97% 98% 99% 95%  Weight:      Height:        Intake/Output Summary (Last 24 hours) at 09/12/2021 1557 Last data filed at 09/12/2021 0636 Gross per 24 hour  Intake 389.77 ml  Output 450 ml  Net -60.23 ml   Filed Weights   09/09/21 1605  Weight: 75.3 kg     Data Reviewed:   CBC: Recent Labs  Lab 09/09/21 1606 09/10/21 0457 09/12/21 0507  WBC 12.1* 12.1* 11.8*  NEUTROABS 10.6*  --  9.9*  HGB 13.3 12.5* 14.0  HCT 39.8 37.9* 41.5  MCV 90.2 89.6 87.9  PLT 184 156 1628  Basic Metabolic Panel: Recent Labs  Lab 09/09/21 1606 09/10/21 0457 09/12/21 0507  NA 135 135 137  K 3.8 4.0 3.7  CL 101 104 109  CO2 _1 GLUCOSE 120* 101* 107*  BUN 24* 26* 33*  CREATININE 1.17 1.13 1.20  CALCIUM 8.6* 8.5* 8.3*  MG  --   --  2.1  PHOS  --   --  2.9   GFR: Estimated Creatinine Clearance: 41 mL/min (by C-G formula based on SCr of 1.2 mg/dL). Liver Function Tests: Recent Labs  Lab 09/09/21 1606 09/10/21 0457 09/12/21 0507  AST 31 33 20  ALT _0 ALKPHOS 65 62 61  BILITOT 1.8* 1.9* 1.1  PROT 7.2 6.2* 5.5*  ALBUMIN 3.9 3.3* 2.9*   No results for input(s): LIPASE, AMYLASE in the last 168 hours. No results for input(s): AMMONIA in the last 168 hours. Coagulation  Profile: Recent Labs  Lab 09/09/21 1606 09/12/21 0507  INR 1.1 1.0   Cardiac Enzymes: No results for input(s): CKTOTAL, CKMB, CKMBINDEX, TROPONINI in the last 168 hours. BNP (last 3 results) Recent Labs    02/26/21 0858  PROBNP 2,005*   HbA1C: No results for input(s): HGBA1C in the last 72 hours. CBG: Recent Labs  Lab 09/10/21 0017  GLUCAP 118*   Lipid Profile: No results for input(s): CHOL, HDL, LDLCALC, TRIG, CHOLHDL, LDLDIRECT in the last 72 hours. Thyroid Function Tests: No results for input(s): TSH, T4TOTAL, FREET4, T3FREE, THYROIDAB in the last 72 hours. Anemia Panel: No results for input(s): VITAMINB12, FOLATE, FERRITIN, TIBC, IRON, RETICCTPCT in the last 72 hours. Sepsis Labs: Recent Labs  Lab 09/09/21 1616  LATICACIDVEN 1.8    Recent Results (from the past 240 hour(s))  Blood Culture (routine x 2)     Status: None (Preliminary result)   Collection Time: 09/09/21  4:06 PM   Specimen: BLOOD  Result Value Ref Range Status   Specimen Description   Final    BLOOD BLOOD RIGHT FOREARM Performed at [facility] [address], [place], [place] [address]    Special Requests   Final    BOTTLES DRAWN AEROBIC AND ANAEROBIC Blood Culture adequate volume Performed at [facility] [address] [place], [place] [address]    Culture   Final    NO GROWTH 3 DAYS Performed at [facility], [place] [address] [place], [place] [address]    Report Status PENDING  Incomplete  Blood Culture (routine x 2)     Status: None (Preliminary result)   Collection Time: 09/09/21  4:11 PM   Specimen: BLOOD  Result Value Ref Range Status   Specimen Description   Final    BLOOD RIGHT ANTECUBITAL Performed at [facility] [address], [place], [place] [address]    Special Requests   Final    BOTTLES DRAWN AEROBIC AND ANAEROBIC Blood Culture results may not be optimal due to an excessive volume of blood received in  culture bottles Performed at [facility] [address], [place], [place] [address]    Culture   Final    NO GROWTH 3 DAYS Performed at [facility], [place] [address] [place], [place] [address]    Report Status PENDING  Incomplete  Resp Panel by RT-PCR (Flu A&B, Covid)     Status: None   Collection Time: 09/09/21  4:37 PM   Specimen: Nasopharyngeal(NP) swabs in vial transport medium  Result Value Ref Range Status   SARS Coronavirus 2 by RT PCR NEGATIVE NEGATIVE Final    Comment: (NOTE) SARS-CoV-2 target nucleic acids are NOT DETECTED.  The SARS-CoV-2 RNA is generally detectable in upper respiratory specimens during the acute phase of infection. The lowest concentration of SARS-CoV-2 viral copies this assay can detect is 138 copies/mL. A negative result does not preclude SARS-Cov-2 infection and should not be used as  the sole basis for treatment or other patient management decisions. A negative result may occur with  improper specimen collection/handling, submission of specimen other than nasopharyngeal swab, presence of viral mutation(s) within the areas targeted by this assay, and inadequate number of viral copies(<138 copies/mL). A negative result must be combined with clinical observations, patient history, and epidemiological information. The expected result is Negative.  Fact Sheet for Patients:  EntrepreneurPulse.com.au  Fact Sheet for Healthcare Providers:  IncredibleEmployment.be  This test is no t yet approved or cleared by the [place] FDA and  has been authorized for detection and/or diagnosis of SARS-CoV-2 by FDA under an Emergency Use Authorization (EUA). This EUA will remain  in effect (meaning this test can be used) for the duration of the COVID-19 declaration under Section 564(b)(1) of the Act, 21 U.S.C.section 360bbb-3(b)(1), unless the authorization is terminated  or revoked sooner.        Influenza A by PCR NEGATIVE NEGATIVE Final   Influenza B by PCR NEGATIVE NEGATIVE Final    Comment: (NOTE) The Xpert Xpress SARS-CoV-2/FLU/RSV plus assay is intended as an aid in the diagnosis of influenza from Nasopharyngeal swab specimens and should not be used as a sole basis for treatment. Nasal washings and aspirates are unacceptable for Xpert Xpress SARS-CoV-2/FLU/RSV testing.  Fact Sheet for Patients: EntrepreneurPulse.com.au  Fact Sheet for Healthcare Providers: IncredibleEmployment.be  This test is not yet approved or cleared by the [place] FDA and has been authorized for detection and/or diagnosis of SARS-CoV-2 by FDA under an Emergency Use Authorization (EUA). This EUA will remain in effect (meaning this test can be used) for the duration of the COVID-19 declaration under Section 564(b)(1) of the Act, 21 U.S.C. section 360bbb-3(b)(1), unless the authorization is terminated or revoked.  Performed at [facility], [place] [address] [place], [place] [address]   Urine Culture     Status: Abnormal   Collection Time: 09/09/21  4:37 PM   Specimen: In/Out Cath Urine  Result Value Ref Range Status   Specimen Description   Final    IN/OUT CATH URINE Performed at [facility] [address] [place], [place] [address]    Special Requests   Final    NONE Performed at [facility], [place] [address] [place], [place] [address]    Culture MULTIPLE SPECIES PRESENT, SUGGEST RECOLLECTION (A)  Final   Report Status 09/10/2021 FINAL  Final         Radiology Studies: CT HEAD WO CONTRAST (5MM)  Result Date: 09/12/2021 CLINICAL DATA:  Stroke, follow-up.  Intracranial hemorrhage. EXAM: CT HEAD WITHOUT CONTRAST TECHNIQUE: Contiguous axial images were obtained from the base of the skull through the vertex without intravenous contrast. COMPARISON:  Head CT 09/09/2021 FINDINGS: Brain: Large  parenchymal hemorrhage anteriorly in the right frontal lobe has not significantly changed in size, measuring approximately 6 cm in maximal dimension. Regional edema and mass effect have also not significantly changed, including 1.2 cm of leftward midline shift. There is a 5 mm parenchymal hemorrhage in the inferolateral aspect of the right parietal lobe without edema, also unchanged. A small amount of hemorrhage in the occipital horns of the lateral ventricles is unchanged, and the ventricles are unchanged in size. No acute cortically based infarct or extra-axial fluid collection is identified. Hypodensities in the cerebral white matter bilaterally are unchanged and nonspecific but compatible with moderate chronic small vessel ischemic disease. There is mild cerebral atrophy. Vascular: Calcified atherosclerosis at the skull base. No  hyperdense vessel. Skull: No acute fracture or suspicious osseous lesion. Sinuses/Orbits: Minimal mucosal thickening in the paranasal sinuses. Likely trace chronic bilateral mastoid fluid. Bilateral cataract extraction. Other: None. IMPRESSION: 1. Unchanged large right frontal parenchymal hemorrhage, regional edema, and mass effect including 1.2 cm of leftward midline shift. 2. Unchanged small right parietal parenchymal hemorrhage and small volume intraventricular hemorrhage. 3. Moderate chronic small vessel ischemic disease. Electronically Signed   By: Logan Bores M.D.   On: 09/12/2021 11:49   CT CHEST ABDOMEN PELVIS WO CONTRAST  Result Date: 09/12/2021 CLINICAL DATA:  Encephalopathy. EXAM: CT CHEST, ABDOMEN AND PELVIS WITHOUT CONTRAST TECHNIQUE: Multidetector CT imaging of the chest, abdomen and pelvis was performed following the standard protocol without IV contrast. COMPARISON:  September 09, 2021. FINDINGS: CT CHEST FINDINGS Cardiovascular: Atherosclerosis of thoracic aorta is noted without aneurysm formation. Mild cardiomegaly is noted. No pericardial effusion is noted.  Coronary artery calcifications are noted. Mediastinum/Nodes: No enlarged mediastinal, hilar, or axillary lymph nodes. Thyroid gland, trachea, and esophagus demonstrate no significant findings. Lungs/Pleura: Small bilateral pleural effusions are noted with adjacent subsegmental atelectasis. No pneumothorax is noted. Mild bilateral posterior upper lobe opacities are noted concerning for atelectasis or edema. Musculoskeletal: No chest wall mass or suspicious bone lesions identified. CT ABDOMEN PELVIS FINDINGS Hepatobiliary: Liver is unremarkable on these unenhanced images. No biliary dilatation is noted. No cholelithiasis is noted. Mild gallbladder distention is noted. Pancreas: Unremarkable. No pancreatic ductal dilatation or surrounding inflammatory changes. Spleen: Normal in size without focal abnormality. Adrenals/Urinary Tract: Adrenal glands appear normal. Bilateral renal cysts are again noted including several left renal hyperdense cyst. No hydronephrosis or renal obstruction is noted. Mild urinary bladder distention is noted. Stomach/Bowel: Stomach is within normal limits. Appendix appears normal. No evidence of bowel wall thickening, distention, or inflammatory changes. Vascular/Lymphatic: Aortic atherosclerosis. No enlarged abdominal or pelvic lymph nodes. Reproductive: Mild prostatic enlargement is noted. Other: Small fat containing left inguinal hernia is noted. No ascites is noted. Musculoskeletal: No fracture is seen. IMPRESSION: Small bilateral pleural effusions are noted with adjacent subsegmental atelectasis. Stable mild opacities are noted posteriorly in both upper lobes concerning for edema or atelectasis. Mild coronary artery calcifications are noted. Mild gallbladder distention is now noted, although no cholelithiasis or surrounding inflammatory changes are noted. Stable bilateral renal cysts are noted. Mild urinary bladder distention is noted. Mild prostatic enlargement. Aortic Atherosclerosis  (ICD10-I70.0). Electronically Signed   By: Marijo Conception M.D.   On: 09/12/2021 11:40        Scheduled Meds:  hydrALAZINE  10 mg Intravenous Once   mouth rinse  15 mL Mouth Rinse BID   Continuous Infusions:  dextrose 5% lactated ringers 30 mL/hr at 09/12/21 1322      LOS: 3 days   Time spent= 35 mins    Kayleen Memos, MD [facility]  If 7PM-7AM, please contact night-coverage  09/12/2021, 3:57 PM

## 2021-09-12 NOTE — Progress Notes (Signed)
Daily Progress Note   Patient Name: Derek Schneider       Date: 09/12/2021 DOB: 1925/10/27  Age: 87 y.o. MRN#: [id number] Attending Physician: Kayleen Memos, DO Primary Care Physician: Hayden Rasmussen, MD Admit Date: 09/09/2021  Reason for Consultation/Follow-up: Establishing goals of care and Terminal Care  Subjective: I saw and examined Derek Schneider.  He was lying in bed in no distress.  Discussed with his 3 daughters at bedside.  They report that he had some restlessness overnight that improved with administration of pain medication.  He was seen by Dr. Nevada Crane this morning and plan is for repeat CT scan.  His family is clear that they understand this may not be a long-term meaningful improvement and they want to ensure that he is not in pain, but at the same time they want to ensure that we are not being "premature" in plan for comfort.  We discussed concern that he remains very compromised in regard to his cognition, nutrition, and functional status.  Discussed that if he does wake up more, large concern will be about his nutrition and hydration.  They are clear that he would not want a feeding tube.  Length of Stay: 3  Current Medications: Scheduled Meds:   hydrALAZINE  10 mg Intravenous Once   mouth rinse  15 mL Mouth Rinse BID    Continuous Infusions:  dextrose 5% lactated ringers 50 mL/hr at 09/12/21 0951    PRN Meds: acetaminophen **OR** acetaminophen, albuterol, atropine, diphenhydrAMINE, glycopyrrolate, haloperidol **OR** haloperidol **OR** haloperidol lactate, hydrALAZINE, labetalol, LORazepam **OR** LORazepam **OR** LORazepam, morphine injection, morphine CONCENTRATE **OR** morphine CONCENTRATE, ondansetron **OR** ondansetron (ZOFRAN) IV, polyvinyl alcohol,  senna  Physical Exam         General: Sleeping.  Does not arouse much for me but was noted to be talking with family earlier. HEENT: No bruits, no goiter, no JVD Heart: Regular rate and rhythm. No murmur appreciated. Lungs: Fair air movement, scattered coarse Abdomen: Soft, nontender, nondistended, positive bowel sounds.   Ext: No significant edema Skin: Warm and dry  Vital Signs: BP (!) 155/96   Pulse (!) 111   Temp 98 F (36.7 C) (Oral)   Resp (!) 25   Ht 6' (1.829 m)   Wt 75.3 kg   SpO2 95%   BMI 22.51 kg/m  SpO2: SpO2: 95 % O2 Device: O2 Device: Nasal Cannula O2 Flow Rate: O2 Flow Rate (L/min): 2 L/min  Intake/output summary:  Intake/Output Summary (Last 24 hours) at 09/12/2021 1103 Last data filed at 09/12/2021 0636 Gross per 24 hour  Intake 389.77 ml  Output 450 ml  Net -60.23 ml    LBM: Last BM Date:  (uta) Baseline Weight: Weight: 75.3 kg Most recent weight: Weight: 75.3 kg       Palliative Assessment/Data:    Flowsheet Rows    Flowsheet Row Most Recent Value  Intake Tab   Referral Department Hospitalist  Unit at Time of Referral Oncology Unit  Palliative Care Primary Diagnosis Neurology  Date Notified 09/10/21  Palliative Care Type New Palliative care  Reason for referral End of Life Care Assistance  Date of Admission 09/09/21  Date first seen by Palliative Care 09/10/21  # of days Palliative referral response time 0 Day(s)  # of days IP prior to Palliative referral 1  Clinical Assessment   Palliative Performance Scale Score 10%  Psychosocial & Spiritual Assessment   Palliative Care Outcomes   Patient/Family meeting held? Yes  Who was at the meeting? Wife, daughters, son, daughter in law       Patient Active Problem List   Diagnosis Date Noted   Intracranial bleeding ([place]) 09/09/2021   Atrial fibrillation ([place]) 05/03/2019   Chest pain, atypical 11/17/2011   Hx of gastroesophageal reflux (GERD) 11/17/2011   GERD 06/29/2008   Essential  hypertension 06/28/2008   ESOPHAGEAL STRICTURE 06/28/2008   HIATAL HERNIA 06/28/2008   DIVERTICULOSIS, COLON 06/28/2008    Palliative Care Assessment & Plan   Patient Profile: 87 y.o. male  with past medical history of CHF, A. fib on Xarelto, hypertension admitted on 09/09/2021 after being seen at his PCP office and sent for infusion.  He was found to have intraparenchymal hemorrhage in the right frontal lobe with 5 cm hemorrhage with 12 mm midline shift.  He was transition to comfort care and palliative consulted for assistance with terminal care.  Recommendations/Plan: Derek Schneider is noted to be more interactive with family today.  They understand that he remains critically ill and this may not be signs of actual long-term improvement but they do want to restart some gentle medical interventions and reassess his situation daily.   Continue gentle hydration Plan for repeat CT If he awakens more, would recommend speech evaluation to see if he is able to manage any intake.  Currently, I do not think he is awake enough that this can be done safely.  Family is clear he would not want a feeding tube and so if he is unable to manage intake safely, we would still be at end-of-life.  Would consider residential hospice at that point. Overall, primary goal remains to ensure that he is not suffering.  Medications for comfort should still be given if needed.  Goals of Care and Additional Recommendations: Limitations on Scope of Treatment: Full Comfort Care  Code Status:    Code Status Orders  (From admission, onward)           Start     Ordered   09/10/21 1213  Do not attempt resuscitation (DNR)  Continuous       Question Answer Comment  In the event of cardiac or respiratory ARREST Do not call a "code blue"   In the event of cardiac or respiratory ARREST Do not perform Intubation, CPR, defibrillation or ACLS   In the event of  cardiac or respiratory ARREST Use medication by any route,  position, wound care, and other measures to relive pain and suffering. May use oxygen, suction and manual treatment of airway obstruction as needed for comfort.      09/10/21 1214           Code Status History     Date Active Date Inactive Code Status Order ID Comments User Context   09/09/2021 2245 09/10/2021 1214 DNR [id number]  Rise Patience, MD ED   09/09/2021 1813 09/09/2021 2245 DNR [id number]  Fredia Sorrow, MD ED      Advance Directive Documentation    Flowsheet Row Most Recent Value  Type of Advance Directive Out of facility DNR (pink MOST or yellow form)  Pre-existing out of facility DNR order (yellow form or pink MOST form) --  "MOST" Form in Place? --       Prognosis:  Hours - Days  Discharge Planning: To Be Determined  Care plan was discussed with family, Dr. [place] Crane  Thank you for allowing the Palliative Medicine Team to assist in the care of this patient.   Total Time 30 Prolonged Time Billed No     Greater than 50%  of this time was spent counseling and coordinating care related to the above assessment and plan.  Micheline Rough, MD  Please contact Palliative Medicine Team phone at [phone] for questions and concerns.

## 2021-09-13 DIAGNOSIS — R4182 Altered mental status, unspecified: Secondary | ICD-10-CM | POA: Diagnosis not present

## 2021-09-13 DIAGNOSIS — I629 Nontraumatic intracranial hemorrhage, unspecified: Secondary | ICD-10-CM | POA: Diagnosis not present

## 2021-09-13 DIAGNOSIS — Z7189 Other specified counseling: Secondary | ICD-10-CM | POA: Diagnosis not present

## 2021-09-13 DIAGNOSIS — Z515 Encounter for palliative care: Secondary | ICD-10-CM | POA: Diagnosis not present

## 2021-09-13 MED ORDER — ORAL CARE MOUTH RINSE
15.0000 mL | Freq: Four times a day (QID) | OROMUCOSAL | Status: DC
  Administered 2021-09-13 – 2021-09-15 (×7): 15 mL via OROMUCOSAL

## 2021-09-13 MED ORDER — DEXTROSE IN LACTATED RINGERS 5 % IV SOLN: INTRAVENOUS | Status: DC

## 2021-09-13 NOTE — Progress Notes (Signed)
PROGRESS NOTE    Elzia Hott Glasco  BWG:[id number] DOB: 1926-08-19 DOA: 09/09/2021 PCP: Hayden Rasmussen, MD   Brief Narrative:  87 year old with history of CHF, A. fib on Xarelto, HTN admitted for confusion from PCP office.  He was found to have intraparenchymal hemorrhage in the right frontal lobe with 5 cm large midline shift effect.  Patient was eventually made comfort care as no further treatment option was available by neurosurgery.  On 09/11/2021, lab studies were ordered at family request.  CT head was repeated on 09/12/2021, no significant changes from prior.  09/13/2021: Patient was seen at bedside with his son present in the room.  No significant changes from prior exam.  High aspiration risk.   Assessment & Plan:   Principal Problem:   Intracranial bleeding (HCC) Active Problems:   Essential hypertension   Hx of gastroesophageal reflux (GERD)   Atrial fibrillation (HCC)  Intracranial hemorrhage with midline shift, 5 cm bleed with 37m left side shift - Frontal brain bleed with midline shift.  Case discussed by admitting provider with neurosurgeon, not a surgical candidate.  Patient transitioned to comfort care Repeat CT head on 09/12/2021 per family's request showed unchanged large right frontal parenchymal hemorrhage, regional edema, mass-effect including 1.2 cm of leftward midline shift.  Unchanged small right parietal parenchymal hemorrhage and small volume intravascular hemorrhage.  Other medical issues: Sepsis possibly from pneumonia CT chest without contrast on 09/12/2021 showed mid sternal nondisplaced body fracture.  No pneumothorax or other complicating feature.  Essential hypertension IV labetalol as needed with parameters  Atrial fibrillation, permanent Congestive heart failure   Daughters at bedside, ongoing discussion with palliative care team.  DVT prophylaxis: SCDs Code Status: DNR comfort care Family Communication: Met with daughters at  bedside  Examination: No significant changes from prior exam.  Constitutional: Frail-appearing, minimally interactive.  Follows commands. Respiratory: Upper respiratory secretions. Cardiovascular: Regular rate and rhythm no rubs or gallops.   Abdomen: Nontender bowel sounds hypoactive. Musculoskeletal: No lower extremity edema bilaterally. Skin: No rashes seen. Neurologic: Minimally responsive.  Follows commands.   Psychiatric: Unable to assess due to obtundation.  Objective: Vitals:   09/10/21 1400 09/10/21 1444 09/11/21 0445 09/12/21 0515  BP: (!) 168/127 (!) 154/102 (!) 153/89 (!) 155/96  Pulse: (!) 108 63 74 (!) 111  Resp: (!) _0 (!) 25  Temp:  98 F (36.7 C) 97.6 F (36.4 C) 98 F (36.7 C)  TempSrc:  Oral Oral Oral  SpO2: 97% 98% 99% 95%  Weight:      Height:        Intake/Output Summary (Last 24 hours) at 09/13/2021 1413 Last data filed at 09/13/2021 1243 Gross per 24 hour  Intake --  Output 600 ml  Net -600 ml   Filed Weights   09/09/21 1605  Weight: 75.3 kg     Data Reviewed:   CBC: Recent Labs  Lab 09/09/21 1606 09/10/21 0457 09/12/21 0507  WBC 12.1* 12.1* 11.8*  NEUTROABS 10.6*  --  9.9*  HGB 13.3 12.5* 14.0  HCT 39.8 37.9* 41.5  MCV 90.2 89.6 87.9  PLT 184 156 1177  Basic Metabolic Panel: Recent Labs  Lab 09/09/21 1606 09/10/21 0457 09/12/21 0507  NA 135 135 137  K 3.8 4.0 3.7  CL 101 104 109  CO2 _1 GLUCOSE 120* 101* 107*  BUN 24* 26* 33*  CREATININE 1.17 1.13 1.20  CALCIUM 8.6* 8.5* 8.3*  MG  --   --  2.1  PHOS  --   --  2.9   GFR: Estimated Creatinine Clearance: 41 mL/min (by C-G formula based on SCr of 1.2 mg/dL). Liver Function Tests: Recent Labs  Lab 09/09/21 1606 09/10/21 0457 09/12/21 0507  AST 31 33 20  ALT _0 ALKPHOS 65 62 61  BILITOT 1.8* 1.9* 1.1  PROT 7.2 6.2* 5.5*  ALBUMIN 3.9 3.3* 2.9*   No results for input(s): LIPASE, AMYLASE in the last 168 hours. No results for input(s): AMMONIA  in the last 168 hours. Coagulation Profile: Recent Labs  Lab 09/09/21 1606 09/12/21 0507  INR 1.1 1.0   Cardiac Enzymes: No results for input(s): CKTOTAL, CKMB, CKMBINDEX, TROPONINI in the last 168 hours. BNP (last 3 results) Recent Labs    02/26/21 0858  PROBNP 2,005*   HbA1C: No results for input(s): HGBA1C in the last 72 hours. CBG: Recent Labs  Lab 09/10/21 0017  GLUCAP 118*   Lipid Profile: No results for input(s): CHOL, HDL, LDLCALC, TRIG, CHOLHDL, LDLDIRECT in the last 72 hours. Thyroid Function Tests: No results for input(s): TSH, T4TOTAL, FREET4, T3FREE, THYROIDAB in the last 72 hours. Anemia Panel: No results for input(s): VITAMINB12, FOLATE, FERRITIN, TIBC, IRON, RETICCTPCT in the last 72 hours. Sepsis Labs: Recent Labs  Lab 09/09/21 1616  LATICACIDVEN 1.8    Recent Results (from the past 240 hour(s))  Blood Culture (routine x 2)     Status: None (Preliminary result)   Collection Time: 09/09/21  4:06 PM   Specimen: BLOOD  Result Value Ref Range Status   Specimen Description   Final    BLOOD BLOOD RIGHT FOREARM Performed at [facility] [address], [place], [place] [address]    Special Requests   Final    BOTTLES DRAWN AEROBIC AND ANAEROBIC Blood Culture adequate volume Performed at [facility] [address], [place], [place] [address]    Culture   Final    NO GROWTH 4 DAYS Performed at [facility], [place] [address] [place], [place] [address]    Report Status PENDING  Incomplete  Blood Culture (routine x 2)     Status: None (Preliminary result)   Collection Time: 09/09/21  4:11 PM   Specimen: BLOOD  Result Value Ref Range Status   Specimen Description   Final    BLOOD RIGHT ANTECUBITAL Performed at [facility] [address], [place], [place] [address]    Special Requests   Final    BOTTLES DRAWN AEROBIC AND ANAEROBIC Blood Culture results may not be optimal due to an  excessive volume of blood received in culture bottles Performed at [facility] [address], [place], [place] [address]    Culture   Final    NO GROWTH 4 DAYS Performed at [facility], [place] [address], [place], [place] [address]    Report Status PENDING  Incomplete  Resp Panel by RT-PCR (Flu A&B, Covid)     Status: None   Collection Time: 09/09/21  4:37 PM   Specimen: Nasopharyngeal(NP) swabs in vial transport medium  Result Value Ref Range Status   SARS Coronavirus 2 by RT PCR NEGATIVE NEGATIVE Final    Comment: (NOTE) SARS-CoV-2 target nucleic acids are NOT DETECTED.  The SARS-CoV-2 RNA is generally detectable in upper respiratory specimens during the acute phase of infection. The lowest concentration of SARS-CoV-2 viral copies this assay can detect is 138 copies/mL. A negative result does not preclude SARS-Cov-2  infection and should not be used as the sole basis for treatment or other patient management decisions. A negative result may occur with  improper specimen collection/handling, submission of specimen other than nasopharyngeal swab, presence of viral mutation(s) within the areas targeted by this assay, and inadequate number of viral copies(<138 copies/mL). A negative result must be combined with clinical observations, patient history, and epidemiological information. The expected result is Negative.  Fact Sheet for Patients:  EntrepreneurPulse.com.au  Fact Sheet for Healthcare Providers:  IncredibleEmployment.be  This test is no t yet approved or cleared by the [place] FDA and  has been authorized for detection and/or diagnosis of SARS-CoV-2 by FDA under an Emergency Use Authorization (EUA). This EUA will remain  in effect (meaning this test can be used) for the duration of the COVID-19 declaration under Section 564(b)(1) of the Act, 21 U.S.C.section 360bbb-3(b)(1), unless the authorization is  terminated  or revoked sooner.       Influenza A by PCR NEGATIVE NEGATIVE Final   Influenza B by PCR NEGATIVE NEGATIVE Final    Comment: (NOTE) The Xpert Xpress SARS-CoV-2/FLU/RSV plus assay is intended as an aid in the diagnosis of influenza from Nasopharyngeal swab specimens and should not be used as a sole basis for treatment. Nasal washings and aspirates are unacceptable for Xpert Xpress SARS-CoV-2/FLU/RSV testing.  Fact Sheet for Patients: EntrepreneurPulse.com.au  Fact Sheet for Healthcare Providers: IncredibleEmployment.be  This test is not yet approved or cleared by the [place] FDA and has been authorized for detection and/or diagnosis of SARS-CoV-2 by FDA under an Emergency Use Authorization (EUA). This EUA will remain in effect (meaning this test can be used) for the duration of the COVID-19 declaration under Section 564(b)(1) of the Act, 21 U.S.C. section 360bbb-3(b)(1), unless the authorization is terminated or revoked.  Performed at [facility], [place] [address] [place], [place] [address]   Urine Culture     Status: Abnormal   Collection Time: 09/09/21  4:37 PM   Specimen: In/Out Cath Urine  Result Value Ref Range Status   Specimen Description   Final    IN/OUT CATH URINE Performed at [facility] [address], [place], [place] [address]    Special Requests   Final    NONE Performed at [facility], [place] [address], [place], [place] [address]    Culture MULTIPLE SPECIES PRESENT, SUGGEST RECOLLECTION (A)  Final   Report Status 09/10/2021 FINAL  Final         Radiology Studies: CT HEAD WO CONTRAST (5MM)  Result Date: 09/12/2021 CLINICAL DATA:  Stroke, follow-up.  Intracranial hemorrhage. EXAM: CT HEAD WITHOUT CONTRAST TECHNIQUE: Contiguous axial images were obtained from the base of the skull through the vertex without intravenous contrast. COMPARISON:   Head CT 09/09/2021 FINDINGS: Brain: Large parenchymal hemorrhage anteriorly in the right frontal lobe has not significantly changed in size, measuring approximately 6 cm in maximal dimension. Regional edema and mass effect have also not significantly changed, including 1.2 cm of leftward midline shift. There is a 5 mm parenchymal hemorrhage in the inferolateral aspect of the right parietal lobe without edema, also unchanged. A small amount of hemorrhage in the occipital horns of the lateral ventricles is unchanged, and the ventricles are unchanged in size. No acute cortically based infarct or extra-axial fluid collection is identified. Hypodensities in the cerebral white matter bilaterally are unchanged and nonspecific but compatible with moderate chronic small vessel ischemic disease. There is mild cerebral atrophy. Vascular:  Calcified atherosclerosis at the skull base. No hyperdense vessel. Skull: No acute fracture or suspicious osseous lesion. Sinuses/Orbits: Minimal mucosal thickening in the paranasal sinuses. Likely trace chronic bilateral mastoid fluid. Bilateral cataract extraction. Other: None. IMPRESSION: 1. Unchanged large right frontal parenchymal hemorrhage, regional edema, and mass effect including 1.2 cm of leftward midline shift. 2. Unchanged small right parietal parenchymal hemorrhage and small volume intraventricular hemorrhage. 3. Moderate chronic small vessel ischemic disease. Electronically Signed   By: Logan Bores M.D.   On: 09/12/2021 11:49   CT CHEST ABDOMEN PELVIS WO CONTRAST  Result Date: 09/12/2021 CLINICAL DATA:  Encephalopathy. EXAM: CT CHEST, ABDOMEN AND PELVIS WITHOUT CONTRAST TECHNIQUE: Multidetector CT imaging of the chest, abdomen and pelvis was performed following the standard protocol without IV contrast. COMPARISON:  September 09, 2021. FINDINGS: CT CHEST FINDINGS Cardiovascular: Atherosclerosis of thoracic aorta is noted without aneurysm formation. Mild cardiomegaly is noted.  No pericardial effusion is noted. Coronary artery calcifications are noted. Mediastinum/Nodes: No enlarged mediastinal, hilar, or axillary lymph nodes. Thyroid gland, trachea, and esophagus demonstrate no significant findings. Lungs/Pleura: Small bilateral pleural effusions are noted with adjacent subsegmental atelectasis. No pneumothorax is noted. Mild bilateral posterior upper lobe opacities are noted concerning for atelectasis or edema. Musculoskeletal: No chest wall mass or suspicious bone lesions identified. CT ABDOMEN PELVIS FINDINGS Hepatobiliary: Liver is unremarkable on these unenhanced images. No biliary dilatation is noted. No cholelithiasis is noted. Mild gallbladder distention is noted. Pancreas: Unremarkable. No pancreatic ductal dilatation or surrounding inflammatory changes. Spleen: Normal in size without focal abnormality. Adrenals/Urinary Tract: Adrenal glands appear normal. Bilateral renal cysts are again noted including several left renal hyperdense cyst. No hydronephrosis or renal obstruction is noted. Mild urinary bladder distention is noted. Stomach/Bowel: Stomach is within normal limits. Appendix appears normal. No evidence of bowel wall thickening, distention, or inflammatory changes. Vascular/Lymphatic: Aortic atherosclerosis. No enlarged abdominal or pelvic lymph nodes. Reproductive: Mild prostatic enlargement is noted. Other: Small fat containing left inguinal hernia is noted. No ascites is noted. Musculoskeletal: No fracture is seen. IMPRESSION: Small bilateral pleural effusions are noted with adjacent subsegmental atelectasis. Stable mild opacities are noted posteriorly in both upper lobes concerning for edema or atelectasis. Mild coronary artery calcifications are noted. Mild gallbladder distention is now noted, although no cholelithiasis or surrounding inflammatory changes are noted. Stable bilateral renal cysts are noted. Mild urinary bladder distention is noted. Mild prostatic  enlargement. Aortic Atherosclerosis (ICD10-I70.0). Electronically Signed   By: Marijo Conception M.D.   On: 09/12/2021 11:40        Scheduled Meds:  mouth rinse  15 mL Mouth Rinse BID   Continuous Infusions:      LOS: 4 days   Time spent= 35 mins    Kayleen Memos, MD [facility]  If 7PM-7AM, please contact night-coverage  09/13/2021, 2:13 PM

## 2021-09-13 NOTE — Progress Notes (Signed)
Manufacturing engineer [facility]) Hospital Liaison note.    Received request from [place] for family interest in [facility]. Visited at bedside with pt and pt's daughter Derek Schneider to initiate education on [employer] and to answer questions.  Chart and pt information under review by [facility] physician.  Hospice eligibility pending at this time.  Derek Schneider is unable to offer a room today. Hospital Liaison will follow up tomorrow or sooner if a room becomes available. Please do not hesitate to call with questions.    Thank you for the opportunity to participate in this patient's care.  Domenic Moras, BSN, RN [facility] Liaison (listed on [place] under Hospice/[facility])    [phone] (24h on call)

## 2021-09-13 NOTE — TOC Initial Note (Signed)
Transition of Care [facility]) - Initial/Assessment Note    Patient Details  Name: FORTUNE BRANNIGAN MRN: [id number] Date of Birth: 1926/07/26  Transition of [facility]) CM/SW Contact:    Lynnell Catalan, RN Phone Number: 09/13/2021, 3:37 PM  Clinical Narrative:                 [facility] consult for residential hospice placement. Family have chosen [employer]. [facility] liaison contacted for referral.  Expected Discharge Plan: [place] Barriers to Discharge: Continued Medical Work up   Expected Discharge Plan and Services Expected Discharge Plan: [place]   Discharge Planning Services: CM Consult Post Acute Care Choice: Residential Hospice Bed Living arrangements for the past 2 months: Single Family Home                     Prior Living Arrangements/Services Living arrangements for the past 2 months: Single Family Home Lives with:: Spouse Patient language and need for interpreter reviewed:: Yes          Care giver support system in place?: Yes (comment)   Criminal Activity/Legal Involvement Pertinent to Current Situation/Hospitalization: No - Comment as needed  Activities of Daily Living Home Assistive Devices/Equipment: Hearing aid (1 hearing aide) ADL Screening (condition at time of admission) Patient's cognitive ability adequate to safely complete daily activities?: No Is the patient deaf or have difficulty hearing?: Yes (wears one hearing aid but not sure which ear.) Does the patient have difficulty seeing, even when wearing glasses/contacts?: No Does the patient have difficulty concentrating, remembering, or making decisions?: Yes (new change) Patient able to express need for assistance with ADLs?: Yes Does the patient have difficulty dressing or bathing?: Yes Independently performs ADLs?: No Communication: Needs assistance Is this a change from baseline?: Change from baseline, expected to last >3 days Dressing (OT): Needs assistance Is  this a change from baseline?: Change from baseline, expected to last >3 days Grooming: Needs assistance Is this a change from baseline?: Change from baseline, expected to last >3 days Feeding: Needs assistance Is this a change from baseline?: Change from baseline, expected to last >3 days Bathing: Needs assistance Is this a change from baseline?: Change from baseline, expected to last >3 days Toileting: Needs assistance Is this a change from baseline?: Change from baseline, expected to last >3days In/Out Bed: Needs assistance Is this a change from baseline?: Change from baseline, expected to last >3 days Walks in Home: Needs assistance Is this a change from baseline?: Change from baseline, expected to last >3 days Does the patient have difficulty walking or climbing stairs?: Yes Weakness of Legs: Both Weakness of Arms/Hands: Both   Admission diagnosis:  Intracranial hemorrhage ([place]) [I62.9] Intracranial bleeding ([place]) [I62.9] Sepsis ([place]) [A41.9] Fever, unspecified fever cause [R50.9] Altered mental status, unspecified altered mental status type [R41.82] Patient Active Problem List   Diagnosis Date Noted   Intracranial bleeding ([place]) 09/09/2021   Atrial fibrillation ([place]) 05/03/2019   Chest pain, atypical 11/17/2011   Hx of gastroesophageal reflux (GERD) 11/17/2011   GERD 06/29/2008   Essential hypertension 06/28/2008   ESOPHAGEAL STRICTURE 06/28/2008   HIATAL HERNIA 06/28/2008   DIVERTICULOSIS, COLON 06/28/2008   PCP:  Hayden Rasmussen, MD Pharmacy:   [facility] ORDER) [place], [place] [address] [place] [address] Phone: [phone] Fax: [phone]  [facility] [place], [place] [address]  Phone: [phone] Fax: [phone]     Social Determinants of Health (SDOH) Interventions    Readmission Risk  Interventions Readmission Risk Prevention Plan 09/13/2021  Transportation Screening Complete  PCP or Specialist Appt within 5-7 Days Complete  Home Care Screening Complete  Medication Review (RN CM) Complete  Some recent data might be hidden

## 2021-09-13 NOTE — Plan of Care (Signed)

## 2021-09-13 NOTE — Progress Notes (Signed)
Clinical swallow evaluation:  Full report to follow.   Pt without a functional swallow, overt aspiration risk.  Family agreeable to comfort po - advised liquids via tsp only.    Kathleen Lime, MS [facility] SLP Acute Rehab Services Office [phone] Pager [phone]

## 2021-09-13 NOTE — Evaluation (Signed)
Clinical/Bedside Swallow Evaluation Patient Details  Name: Derek Schneider MRN: [id number] Date of Birth: [birth year]  Today's Date: 09/13/2021 Time: SLP Start Time (ACUTE ONLY): 1218 SLP Stop Time (ACUTE ONLY): 4034 SLP Time Calculation (min) (ACUTE ONLY): 57 min  Past Medical History:  Past Medical History:  Diagnosis Date   Acoustic neuroma ([place])    Right ear   Aortic insufficiency    Asthma    Bronchitis, not specified as acute or chronic    Chronic diastolic CHF (congestive heart failure) (HCC)    Diverticulosis of colon (without mention of hemorrhage)    Hiatal hernia    Internal hemorrhoids without mention of complication    Mild CAD    Mitral regurgitation    Persistent atrial fibrillation (HCC)    Personal history of colonic polyps 01/12/2008   TUBULOVILLOUS ADENOMA   PVC (premature ventricular contraction)    Stricture and stenosis of esophagus    Unspecified essential hypertension    Unspecified gastritis and gastroduodenitis without mention of hemorrhage    Wears contact lenses    right eye   Past Surgical History:  Past Surgical History:  Procedure Laterality Date   CARDIAC CATHETERIZATION     ESOPHAGOGASTRODUODENOSCOPY (EGD) WITH PROPOFOL N/A 09/16/2019   Procedure: ESOPHAGOGASTRODUODENOSCOPY (EGD) WITH PROPOFOL;  Surgeon: Carol Ada, MD;  Location: [facility];  Service: Endoscopy;  Laterality: N/A;   KNEE SURGERY  as a child   left patella removal as a child   SAVORY DILATION N/A 09/16/2019   Procedure: SAVORY DILATION;  Surgeon: Carol Ada, MD;  Location: [facility];  Service: Endoscopy;  Laterality: N/A;   TESTICLAR CYST EXCISION     TONSILLECTOMY     HPI:  87 yo male adm to [facility] with confusion - referred from PCP.  Pt has h/o significant right frontal hemorrhage with 5 cm large midline shift effect and surgery not an option, labs ordered per family request 09/11/2021. He has previously been made comfort care and no feeding tube was planned.   Pt  has h/o CHF, HTN, Afib on Xarelto.  CT abdomen showed small bilateral pleural effusion with adjacent subsemtnal atx.  Stable mild opacities noted in posterior upper lobes concerning for edema or ATX.  Pt has continued with AMS, and upper airway secretions.  Daughter and son present during evaluation.    Assessment / Plan / Recommendation  Clinical Impression  Patient lying in bed with daughter and son present.  He barely opens his eyes and is severely dysarthria with only intelligible articulation approx 10% of the time for single words or short phrases.  Oral cavity severely xerostomic with dried secretions throughout.   Per daughter, pt wanted coffee yesterday. After gathering supplies with NT assist, SLP provided extensive oral care. Pt with baseline upper airway congestion that he does not clear despite reflexive or cued coughing.   SLP did provide pt with tsp x2 of coffee without overt indication of aspiration.  Decreased labial seal - allows anterior loss of boluses via tsp and cup.  Pt's swallowing appeared discoordinated and after small cup bolus was swallowed, pt with rapid coughing, increasing throat clearing.  He does follow directions to swallow and cough - but not effective to clinically clear secretions.  Delayed gurgly breathing noted and reflexive coughing removed viscous secretions - appeared coffee colored.  SLP suspects pt aspirated and reflexive cough allowed clearance of secretions.  Pt agreed that he did not desire more po intake at that time.  At this time, Derek Schneider does not demonstrate a functional swallow and concern for even secretion aspiration is present.   SLP educated family to concept of comfort feeding - being not for nutritional value but for pt to have a taste of items he likes in small amounts, ceasing po as soon as he has difficulty.  Recommend adequate oral care given pt is a mouthbreather.  Daughter and son reported understanding to information reviewed and agreeble to  comfort po. No SLP follow up warranted. Thanks. SLP Visit Diagnosis: Dysphagia, oropharyngeal phase (R13.12)    Aspiration Risk  Severe aspiration risk;Risk for inadequate nutrition/hydration    Diet Recommendation  (comfort po  - liquids, ice cream, etc likely will be most comfortable for pt to consume)   Liquid Administration via: Spoon Medication Administration: Via alternative means Compensations: Slow rate;Small sips/bites Postural Changes: Seated upright at 90 degrees    Other  Recommendations Oral Care Recommendations: Oral care QID    Recommendations for follow up therapy are one component of a multi-disciplinary discharge planning process, led by the attending physician.  Recommendations may be updated based on patient status, additional functional criteria and insurance authorization.  Follow up Recommendations No SLP follow up      Assistance Recommended at Discharge  N/a  Functional Status Assessment  N/a  Frequency and Duration     N/a       Prognosis   guarded     Swallow Study   General Date of Onset: 09/13/21 HPI: 87 yo male adm to [facility] with confusion - referred from PCP.  Pt has h/o significant right frontal hemorrhage with 5 cm large midline shift effect and surgery not an option, labs ordered per family request 09/11/2021. He has previously been made comfort care and no feeding tube was planned.   Pt has h/o CHF, HTN, Afib on Xarelto.  CT abdomen showed small bilateral pleural effusion with adjacent subsemtnal atx.  Stable mild opacities noted in posterior upper lobes concerning for edema or ATX.  Pt has continued with AMS, and upper airway secretions.  Daughter and son present during evaluation. Type of Study: Bedside Swallow Evaluation Previous Swallow Assessment: none in Diet Prior to this Study: Thin liquids Temperature Spikes Noted: No Respiratory Status: Room air History of Recent Intubation: No Behavior/Cognition: Lethargic/Drowsy Oral Cavity  Assessment: Dry;Dried secretions Oral Care Completed by SLP: Yes (extensive oral care, without full clearance of secretions, set up separate suction for oral care) Oral Cavity - Dentition: Adequate natural dentition Vision: Impaired for self-feeding Self-Feeding Abilities: Total assist Patient Positioning: Partially reclined Baseline Vocal Quality: Low vocal intensity;Suspected CN X (Vagus) involvement Volitional Cough: Weak;Congested Volitional Swallow: Able to elicit (with significant delay and effort)    Oral/Motor/Sensory Function Overall Oral Motor/Sensory Function: Generalized oral weakness (profound generalized weakness)   Ice Chips Ice chips: Not tested   Thin Liquid Thin Liquid: Impaired Presentation: Spoon;Cup Oral Phase Functional Implications: Right anterior spillage;Left anterior spillage;Prolonged oral transit Pharyngeal  Phase Impairments: Suspected delayed Swallow;Cough - Immediate;Cough - Delayed;Throat Clearing - Immediate    Nectar Thick Nectar Thick Liquid: Not tested   Honey Thick Honey Thick Liquid: Not tested   Puree Puree: Not tested   Solid     Solid: Not tested      Macario Golds 09/13/2021,2:44 PM   Kathleen Lime, MS [facility] SLP Acute Rehab Services Office [phone] Pager [phone]

## 2021-09-14 DIAGNOSIS — I629 Nontraumatic intracranial hemorrhage, unspecified: Secondary | ICD-10-CM | POA: Diagnosis not present

## 2021-09-14 DIAGNOSIS — Z7189 Other specified counseling: Secondary | ICD-10-CM | POA: Diagnosis not present

## 2021-09-14 DIAGNOSIS — R4182 Altered mental status, unspecified: Secondary | ICD-10-CM | POA: Diagnosis not present

## 2021-09-14 DIAGNOSIS — Z515 Encounter for palliative care: Secondary | ICD-10-CM | POA: Diagnosis not present

## 2021-09-14 LAB — CULTURE, BLOOD (ROUTINE X 2)
Culture: NO GROWTH
Culture: NO GROWTH
Special Requests: ADEQUATE

## 2021-09-14 MED ORDER — SCOPOLAMINE 1 MG/3DAYS TD PT72
1.0000 | MEDICATED_PATCH | TRANSDERMAL | Status: DC
  Administered 2021-09-14: 1.5 mg via TRANSDERMAL
  Filled 2021-09-14: qty 1

## 2021-09-14 MED ORDER — MORPHINE SULFATE (PF) 2 MG/ML IV SOLN
1.0000 mg | INTRAVENOUS | Status: DC | PRN
Start: 2021-09-14 — End: 2021-09-15
  Administered 2021-09-14 – 2021-09-15 (×7): 2 mg via INTRAVENOUS
  Filled 2021-09-14 (×7): qty 1

## 2021-09-14 MED ORDER — GLYCOPYRROLATE 0.2 MG/ML IJ SOLN
0.4000 mg | INTRAMUSCULAR | Status: DC
  Administered 2021-09-14 – 2021-09-15 (×7): 0.4 mg via INTRAVENOUS
  Filled 2021-09-14 (×8): qty 2

## 2021-09-14 MED ORDER — GLYCOPYRROLATE 0.2 MG/ML IJ SOLN
0.2000 mg | INTRAMUSCULAR | Status: DC
Start: 2021-09-14 — End: 2021-09-14
  Administered 2021-09-14: 0.2 mg via INTRAVENOUS
  Filled 2021-09-14: qty 1

## 2021-09-14 NOTE — TOC Progression Note (Signed)
Transition of Care [facility]) - Progression Note    Patient Details  Name: Derek Schneider MRN: [id number] Date of Birth: [birth year]  Transition of Care [facility]) CM/SW [place], [place] Phone Number: 09/14/2021, 10:49 AM  Clinical Narrative:    CSW reached out to [place] liaison to see if there was a bed available at [facility]. At this time there is no bed available today. However, they may be able to offer a bed tomorrow. TOC to follow.   Expected Discharge Plan: [place] Barriers to Discharge: Continued Medical Work up  Expected Discharge Plan and Services Expected Discharge Plan: [place]   Discharge Planning Services: CM Consult Post Acute Care Choice: Residential Hospice Bed Living arrangements for the past 2 months: Single Family Home Expected Discharge Date: 09/14/21                                     Social Determinants of Health (SDOH) Interventions    Readmission Risk Interventions Readmission Risk Prevention Plan 09/13/2021  Transportation Screening Complete  PCP or Specialist Appt within 5-7 Days Complete  Home Care Screening Complete  Medication Review (RN CM) Complete  Some recent data might be hidden

## 2021-09-14 NOTE — Progress Notes (Signed)
Daily Progress Note   Patient Name: Derek Schneider       Date: 09/14/2021 DOB: 1926/02/29  Age: 87 y.o. MRN#: [id number] Attending Physician: Kayleen Memos, DO Primary Care Physician: Hayden Rasmussen, MD Admit Date: 09/09/2021  Reason for Consultation/Follow-up: Establishing goals of care and Terminal Care  Subjective: I saw and examined Derek Schneider.  He was lying in bed in no distress.  Discussed with 2 daughters and son at bedside.  They report that he had some restlessness overnight that improved with administration of pain medication.  We again reviewed that he is not taking in nutrition or hydration and that we are, unfortunately, still approaching end of life.  Discussed plan to have SLP evaluate to have objective evaluation of his ability to sustain himself and, if he is not able to participate, recommendation will be for residential hospice.  Called and reviewed case with SLP.  Appreciate input.  Length of Stay: 5  Current Medications: Scheduled Meds:   mouth rinse  15 mL Mouth Rinse QID    Continuous Infusions:  dextrose 5% lactated ringers 30 mL/hr at 09/14/21 0336    PRN Meds: acetaminophen **OR** acetaminophen, albuterol, atropine, diphenhydrAMINE, glycopyrrolate, haloperidol **OR** haloperidol **OR** haloperidol lactate, labetalol, LORazepam **OR** LORazepam **OR** LORazepam, morphine injection, morphine CONCENTRATE **OR** morphine CONCENTRATE, ondansetron **OR** ondansetron (ZOFRAN) IV, polyvinyl alcohol, senna  Physical Exam         General: Sleeping.  Does not arouse much for me but was noted to be talking with family earlier. HEENT: No bruits, no goiter, no JVD Heart: Regular rate and rhythm. No murmur appreciated. Lungs: Fair air movement, scattered  coarse Abdomen: Soft, nontender, nondistended, positive bowel sounds.   Ext: No significant edema Skin: Warm and dry  Vital Signs: BP (!) 174/119 (BP Location: Right Arm)   Pulse (!) 120   Temp 98.1 F (36.7 C) (Oral)   Resp 18   Ht 6' (1.829 m)   Wt 75.3 kg   SpO2 93%   BMI 22.51 kg/m  SpO2: SpO2: 93 % O2 Device: O2 Device: Nasal Cannula O2 Flow Rate: O2 Flow Rate (L/min): 2 L/min  Intake/output summary:  Intake/Output Summary (Last 24 hours) at 09/14/2021 0842 Last data filed at 09/14/2021 0336 Gross per 24 hour  Intake 242.69 ml  Output 600 ml  Net -357.31 ml    LBM: Last BM Date:  (UTA) Baseline Weight: Weight: 75.3 kg Most recent weight: Weight: 75.3 kg       Palliative Assessment/Data:    Flowsheet Rows    Flowsheet Row Most Recent Value  Intake Tab   Referral Department Hospitalist  Unit at Time of Referral Oncology Unit  Palliative Care Primary Diagnosis Neurology  Date Notified 09/10/21  Palliative Care Type New Palliative care  Reason for referral End of Life Care Assistance  Date of Admission 09/09/21  Date first seen by Palliative Care 09/10/21  # of days Palliative referral response time 0 Day(s)  # of days IP prior to Palliative referral 1  Clinical Assessment   Palliative Performance Scale Score 10%  Psychosocial & Spiritual Assessment   Palliative Care Outcomes   Patient/Family meeting held? Yes  Who was at the meeting? Wife, daughters, son, daughter in law       Patient Active Problem List   Diagnosis Date Noted   Intracranial bleeding (Derek Schneider) 09/09/2021   Atrial fibrillation (Derek Schneider) 05/03/2019   Chest pain, atypical 11/17/2011   Hx of gastroesophageal reflux (GERD) 11/17/2011   GERD 06/29/2008   Essential hypertension 06/28/2008   ESOPHAGEAL STRICTURE 06/28/2008   HIATAL HERNIA 06/28/2008   DIVERTICULOSIS, COLON 06/28/2008    Palliative Care Assessment & Plan   Patient Profile: 87 y.o. male  with past medical history of CHF, A.  fib on Xarelto, hypertension admitted on 09/09/2021 after being seen at his PCP office and sent for infusion.  He was found to have intraparenchymal hemorrhage in the right frontal lobe with 5 cm hemorrhage with 12 mm midline shift.  He was transition to comfort care and palliative consulted for assistance with terminal care.  Recommendations/Plan: Derek Schneider is noted to be more interactive with family today.  They understand that he remains critically ill and this may not be signs of actual long-term improvement but they do want to restart some gentle medical interventions and reassess his situation daily.   Continue gentle hydration Repeat CT without significant change Discussed with SLP who will eval to see if he is able to manage any intake.  I do not think that this will be the case.  Family is clear he would not want a feeding tube and so if he is unable to manage intake safely, we are at end-of-life.  Would recommend residential hospice at that point. Overall, primary goal remains to ensure that he is not suffering.  Medications for comfort should still be given if needed.  Goals of Care and Additional Recommendations: Limitations on Scope of Treatment: Full Comfort Care  Code Status:    Code Status Orders  (From admission, onward)           Start     Ordered   09/10/21 1213  Do not attempt resuscitation (DNR)  Continuous       Question Answer Comment  In the event of cardiac or respiratory ARREST Do not call a "code blue"   In the event of cardiac or respiratory ARREST Do not perform Intubation, CPR, defibrillation or ACLS   In the event of cardiac or respiratory ARREST Use medication by any route, position, wound care, and other measures to relive pain and suffering. May use oxygen, suction and manual treatment of airway obstruction as needed for comfort.      09/10/21 1214           Code Status History  Date Active Date Inactive Code Status Order ID Comments User  Context   09/09/2021 2245 09/10/2021 1214 DNR [id number]  Rise Patience, MD ED   09/09/2021 1813 09/09/2021 2245 DNR [id number]  Fredia Sorrow, MD ED      Advance Directive Documentation    Flowsheet Row Most Recent Value  Type of Advance Directive Out of facility DNR (pink MOST or yellow form)  Pre-existing out of facility DNR order (yellow form or pink MOST form) --  "MOST" Form in Place? --       Prognosis:  Hours - Days  Discharge Planning: To Be Determined  Care plan was discussed with family, Dr. [place] Crane, SLP  Thank you for allowing the Palliative Medicine Team to assist in the care of this patient.   Total Time 40 Prolonged Time Billed No   Micheline Rough, MD  Please contact Palliative Medicine Team phone at [phone] for questions and concerns.

## 2021-09-14 NOTE — Progress Notes (Signed)
Daily Progress Note   Patient Name: Derek Schneider       Date: 09/14/2021 DOB: 1926/01/07  Age: 87 y.o. MRN#: [id number] Attending Physician: Kayleen Memos, DO Primary Care Physician: Hayden Rasmussen, MD Admit Date: 09/09/2021  Reason for Consultation/Follow-up: Establishing goals of care and Terminal Care  Subjective: I saw and examined Derek Schneider.  He was lying in bed in no distress.  Discussed with family at bedside.  They report that he has been doing well overall but does have some periods where he gets agitated related to the excess secretions.  We discussed secretion management and dying patients including both medication management and non-pharmacologic management.  Family is aware that [facility] does not have bed available today.  We discussed plan to continue to assess daily if that is available and if he is stable for transport.  Length of Stay: 5  Current Medications: Scheduled Meds:   glycopyrrolate  0.4 mg Intravenous Q4H   mouth rinse  15 mL Mouth Rinse QID   scopolamine  1 patch Transdermal Q72H    Continuous Infusions:    PRN Meds: acetaminophen **OR** acetaminophen, albuterol, atropine, diphenhydrAMINE, haloperidol **OR** haloperidol **OR** haloperidol lactate, labetalol, LORazepam **OR** LORazepam **OR** LORazepam, morphine injection, morphine CONCENTRATE **OR** morphine CONCENTRATE, ondansetron **OR** ondansetron (ZOFRAN) IV, polyvinyl alcohol, senna  Physical Exam         General: Sleeping. HEENT: No bruits, no goiter, no JVD Heart: Regular rate and rhythm. No murmur appreciated. Lungs: Fair air movement, scattered coarse Abdomen: Soft, nontender, nondistended, positive bowel sounds.   Ext: No significant edema Skin: Warm and dry  Vital Signs:  BP (!) 174/119 (BP Location: Right Arm)   Pulse (!) 120   Temp 98.1 F (36.7 C) (Oral)   Resp 18   Ht 6' (1.829 m)   Wt 75.3 kg   SpO2 93%   BMI 22.51 kg/m  SpO2: SpO2: 93 % O2 Device: O2 Device: Nasal Cannula O2 Flow Rate: O2 Flow Rate (L/min): 2 L/min  Intake/output summary:  Intake/Output Summary (Last 24 hours) at 09/14/2021 1833 Last data filed at 09/14/2021 0336 Gross per 24 hour  Intake 242.69 ml  Output --  Net 242.69 ml    LBM: Last BM Date:  (UTA) Baseline Weight: Weight: 75.3 kg Most recent weight: Weight: 75.3 kg  Palliative Assessment/Data:    Flowsheet Rows    Flowsheet Row Most Recent Value  Intake Tab   Referral Department Hospitalist  Unit at Time of Referral Oncology Unit  Palliative Care Primary Diagnosis Neurology  Date Notified 09/10/21  Palliative Care Type New Palliative care  Reason for referral End of Life Care Assistance  Date of Admission 09/09/21  Date first seen by Palliative Care 09/10/21  # of days Palliative referral response time 0 Day(s)  # of days IP prior to Palliative referral 1  Clinical Assessment   Palliative Performance Scale Score 10%  Psychosocial & Spiritual Assessment   Palliative Care Outcomes   Patient/Family meeting held? Yes  Who was at the meeting? Wife, daughters, son, daughter in law       Patient Active Problem List   Diagnosis Date Noted   Intracranial bleeding ([place]) 09/09/2021   Atrial fibrillation ([place]) 05/03/2019   Chest pain, atypical 11/17/2011   Hx of gastroesophageal reflux (GERD) 11/17/2011   GERD 06/29/2008   Essential hypertension 06/28/2008   ESOPHAGEAL STRICTURE 06/28/2008   HIATAL HERNIA 06/28/2008   DIVERTICULOSIS, COLON 06/28/2008    Palliative Care Assessment & Plan   Patient Profile: 87 y.o. male  with past medical history of CHF, A. fib on Xarelto, hypertension admitted on 09/09/2021 after being seen at his PCP office and sent for infusion.  He was found to have  intraparenchymal hemorrhage in the right frontal lobe with 5 cm hemorrhage with 12 mm midline shift.  He was transition to comfort care and palliative consulted for assistance with terminal care.  Recommendations/Plan: Derek Schneider is actively dying.  He appears to be much weaker to me today. Excess secretions: Primary concern at this point in time.  Increased Robinul to 0.4 every 4 hours.  Agree with scopolamine patch.  Discussed nonpharmacologic management of secretions and the fact that this can be an unavoidable an expected part of the dying process.  Discussed other signs of agitation and use of medications for agitation as needed and to relieve any pain or shortness of breath. Will continue to assess for residential hospice, but he may reach a point where he is not stable for transfer.  Goals of Care and Additional Recommendations: Limitations on Scope of Treatment: Full Comfort Care  Code Status:    Code Status Orders  (From admission, onward)           Start     Ordered   09/10/21 1213  Do not attempt resuscitation (DNR)  Continuous       Question Answer Comment  In the event of cardiac or respiratory ARREST Do not call a "code blue"   In the event of cardiac or respiratory ARREST Do not perform Intubation, CPR, defibrillation or ACLS   In the event of cardiac or respiratory ARREST Use medication by any route, position, wound care, and other measures to relive pain and suffering. May use oxygen, suction and manual treatment of airway obstruction as needed for comfort.      09/10/21 1214           Code Status History     Date Active Date Inactive Code Status Order ID Comments User Context   09/09/2021 2245 09/10/2021 1214 DNR [id number]  Rise Patience, MD ED   09/09/2021 1813 09/09/2021 2245 DNR [id number]  Fredia Sorrow, MD ED      Advance Directive Documentation    Flowsheet Row Most Recent Value  Type of Advance Directive Out of facility  DNR (pink MOST  or yellow form)  Pre-existing out of facility DNR order (yellow form or pink MOST form) --  "MOST" Form in Place? --       Prognosis:  Hours - Days  Discharge Planning: To Be Determined  Care plan was discussed with family, Dr. [place] Crane, SLP  Thank you for allowing the Palliative Medicine Team to assist in the care of this patient.   Total Time 35 Prolonged Time Billed No   Greater than 50%  of this time was spent counseling and coordinating care related to the above assessment and plan.   Micheline Rough, MD  Please contact Palliative Medicine Team phone at [phone] for questions and concerns.

## 2021-09-14 NOTE — Progress Notes (Signed)
PROGRESS NOTE    Derek Schneider  YHC:[id number] DOB: 30-Jul-1926 DOA: 09/09/2021 PCP: Hayden Rasmussen, MD   Brief Narrative:  87 year old with history of CHF, A. fib on Xarelto, HTN admitted for confusion from PCP office.  He was found to have intraparenchymal hemorrhage in the right frontal lobe with 5 cm large midline shift effect.  Patient was eventually made comfort care as no further treatment option was available by neurosurgery.  On 09/11/2021, lab studies were ordered at family request.  CT head was repeated on 09/12/2021, no significant changes from prior.  09/14/2021: Continuing comfort care.  Lots of secretions noted while on IV Robinul suspect from silent aspiration, added scopolamine patch and oral suction every 4 hours and as needed.  Working on residential hospice placement.  Assessment & Plan:   Principal Problem:   Intracranial bleeding (HCC) Active Problems:   Essential hypertension   Hx of gastroesophageal reflux (GERD)   Atrial fibrillation (HCC)  Intracranial hemorrhage with midline shift, 5 cm bleed with 34mm left side shift - Frontal brain bleed with midline shift.  Case discussed by admitting provider with neurosurgeon, not a surgical candidate.  Patient transitioned to comfort care Repeat CT head on 09/12/2021 per family's request showed unchanged large right frontal parenchymal hemorrhage, regional edema, mass-effect including 1.2 cm of leftward midline shift.  Unchanged right parietal parenchymal hemorrhage and small volume intravascular hemorrhage.  Increase in oral secretions, suspect from silent aspiration Continue comfort care.  Lots of secretions noted while on IV Robinul suspect from silent aspiration, added scopolamine patch and oral suction every 4 hours and as needed.  Working on residential hospice placement.  Sepsis possibly from pneumonia CT chest without contrast on 09/12/2021 showed mid sternal nondisplaced body fracture.  No pneumothorax or other  complicating feature. Comfort care measures  Essential hypertension IV labetalol as needed with parameters  Atrial fibrillation, permanent Chronic diastolic congestive heart failure Last 2D echo done on 03/26/2021 showed LVEF 70 to 75%, left atrial size was severely dilated, right atrial size was mildly dilated.  The mitral valve is abnormal.  Mild to moderate mitral valve regurgitation.  Daughter at bedside, she is okay with hospice care.  DVT prophylaxis: Comfort care. Code Status: DNR comfort care Family Communication: Daughter at bedside.  Examination:   Constitutional: Frail-appearing, actively dying.   Respiratory: Audible rhonchorous sounds. Cardiovascular: Regular rate and rhythm no rubs or gallops. Abdomen: Nontender bowel sounds present.   Musculoskeletal: No lower extremity edema bilaterally. Skin: No ulcerative lesions noted.   Neurologic: Minimally responsive.   Psychiatric: Unable to assess mood due to obtundation.  Actively dying.  Objective: Vitals:   09/11/21 0445 09/12/21 0515 09/13/21 1420 09/14/21 0628  BP: (!) 153/89 (!) 155/96 (!) 159/90 (!) 174/119  Pulse: 74 (!) 111 99 (!) 120  Resp: 15 (!) 25 14 18   Temp: 97.6 F (36.4 C) 98 F (36.7 C) (!) 97.3 F (36.3 C) 98.1 F (36.7 C)  TempSrc: Oral Oral Oral Oral  SpO2: 99% 95% 100% 93%  Weight:      Height:        Intake/Output Summary (Last 24 hours) at 09/14/2021 1100 Last data filed at 09/14/2021 0336 Gross per 24 hour  Intake 242.69 ml  Output 600 ml  Net -357.31 ml   Filed Weights   09/09/21 1605  Weight: 75.3 kg     Data Reviewed:   CBC: Recent Labs  Lab 09/09/21 1606 09/10/21 0457 09/12/21 0507  WBC 12.1* 12.1* 11.8*  NEUTROABS 10.6*  --  9.9*  HGB 13.3 12.5* 14.0  HCT 39.8 37.9* 41.5  MCV 90.2 89.6 87.9  PLT 184 156 056   Basic Metabolic Panel: Recent Labs  Lab 09/09/21 1606 09/10/21 0457 09/12/21 0507  NA 135 135 137  K 3.8 4.0 3.7  CL 101 104 109  CO2 23 24 22    GLUCOSE 120* 101* 107*  BUN 24* 26* 33*  CREATININE 1.17 1.13 1.20  CALCIUM 8.6* 8.5* 8.3*  MG  --   --  2.1  PHOS  --   --  2.9   GFR: Estimated Creatinine Clearance: 41 mL/min (by C-G formula based on SCr of 1.2 mg/dL). Liver Function Tests: Recent Labs  Lab 09/09/21 1606 09/10/21 0457 09/12/21 0507  AST 31 33 20  ALT 21 22 16   ALKPHOS 65 62 61  BILITOT 1.8* 1.9* 1.1  PROT 7.2 6.2* 5.5*  ALBUMIN 3.9 3.3* 2.9*   No results for input(s): LIPASE, AMYLASE in the last 168 hours. No results for input(s): AMMONIA in the last 168 hours. Coagulation Profile: Recent Labs  Lab 09/09/21 1606 09/12/21 0507  INR 1.1 1.0   Cardiac Enzymes: No results for input(s): CKTOTAL, CKMB, CKMBINDEX, TROPONINI in the last 168 hours. BNP (last 3 results) Recent Labs    02/26/21 0858  PROBNP 2,005*   HbA1C: No results for input(s): HGBA1C in the last 72 hours. CBG: Recent Labs  Lab 09/10/21 0017  GLUCAP 118*   Lipid Profile: No results for input(s): CHOL, HDL, LDLCALC, TRIG, CHOLHDL, LDLDIRECT in the last 72 hours. Thyroid Function Tests: No results for input(s): TSH, T4TOTAL, FREET4, T3FREE, THYROIDAB in the last 72 hours. Anemia Panel: No results for input(s): VITAMINB12, FOLATE, FERRITIN, TIBC, IRON, RETICCTPCT in the last 72 hours. Sepsis Labs: Recent Labs  Lab 09/09/21 1616  LATICACIDVEN 1.8    Recent Results (from the past 240 hour(s))  Blood Culture (routine x 2)     Status: None   Collection Time: 09/09/21  4:06 PM   Specimen: BLOOD  Result Value Ref Range Status   Specimen Description   Final    BLOOD BLOOD RIGHT FOREARM Performed at [facility] [address] [place], [place] [address]    Special Requests   Final    BOTTLES DRAWN AEROBIC AND ANAEROBIC Blood Culture adequate volume Performed at [facility] [address], [place], [place] [address]    Culture   Final    NO GROWTH 5 DAYS Performed at [facility], [place] [address] [place], [place] [address]    Report Status 09/14/2021 FINAL  Final  Blood Culture (routine x 2)     Status: None   Collection Time: 09/09/21  4:11 PM   Specimen: BLOOD  Result Value Ref Range Status   Specimen Description   Final    BLOOD RIGHT ANTECUBITAL Performed at [facility] [address] [place], [place] [address]    Special Requests   Final    BOTTLES DRAWN AEROBIC AND ANAEROBIC Blood Culture results may not be optimal due to an excessive volume of blood received in culture bottles Performed at [facility] [address], [place], [place] [address]    Culture   Final    NO GROWTH 5 DAYS Performed at [facility], [place] [address] [place],  [address]    Report Status 09/14/2021 FINAL  Final  Resp Panel by RT-PCR (Flu A&B, Covid)  Status: None   Collection Time: 09/09/21  4:37 PM   Specimen: Nasopharyngeal(NP) swabs in vial transport medium  Result Value Ref Range Status   SARS Coronavirus 2 by RT PCR NEGATIVE NEGATIVE Final    Comment: (NOTE) SARS-CoV-2 target nucleic acids are NOT DETECTED.  The SARS-CoV-2 RNA is generally detectable in upper respiratory specimens during the acute phase of infection. The lowest concentration of SARS-CoV-2 viral copies this assay can detect is 138 copies/mL. A negative result does not preclude SARS-Cov-2 infection and should not be used as the sole basis for treatment or other patient management decisions. A negative result may occur with  improper specimen collection/handling, submission of specimen other than nasopharyngeal swab, presence of viral mutation(s) within the areas targeted by this assay, and inadequate number of viral copies(<138 copies/mL). A negative result must be combined with clinical observations, patient history, and epidemiological information. The expected result is Negative.  Fact Sheet for Patients:   EntrepreneurPulse.com.au  Fact Sheet for Healthcare Providers:  IncredibleEmployment.be  This test is no t yet approved or cleared by the [place] FDA and  has been authorized for detection and/or diagnosis of SARS-CoV-2 by FDA under an Emergency Use Authorization (EUA). This EUA will remain  in effect (meaning this test can be used) for the duration of the COVID-19 declaration under Section 564(b)(1) of the Act, 21 U.S.C.section 360bbb-3(b)(1), unless the authorization is terminated  or revoked sooner.       Influenza A by PCR NEGATIVE NEGATIVE Final   Influenza B by PCR NEGATIVE NEGATIVE Final    Comment: (NOTE) The Xpert Xpress SARS-CoV-2/FLU/RSV plus assay is intended as an aid in the diagnosis of influenza from Nasopharyngeal swab specimens and should not be used as a sole basis for treatment. Nasal washings and aspirates are unacceptable for Xpert Xpress SARS-CoV-2/FLU/RSV testing.  Fact Sheet for Patients: EntrepreneurPulse.com.au  Fact Sheet for Healthcare Providers: IncredibleEmployment.be  This test is not yet approved or cleared by the [place] FDA and has been authorized for detection and/or diagnosis of SARS-CoV-2 by FDA under an Emergency Use Authorization (EUA). This EUA will remain in effect (meaning this test can be used) for the duration of the COVID-19 declaration under Section 564(b)(1) of the Act, 21 U.S.C. section 360bbb-3(b)(1), unless the authorization is terminated or revoked.  Performed at [facility], [place] [address] [place], [place] [address]   Urine Culture     Status: Abnormal   Collection Time: 09/09/21  4:37 PM   Specimen: In/Out Cath Urine  Result Value Ref Range Status   Specimen Description   Final    IN/OUT CATH URINE Performed at [facility] [address], [place], [place] [address]    Special Requests   Final     NONE Performed at [facility], [place] [address] [place], [place] [address]    Culture MULTIPLE SPECIES PRESENT, SUGGEST RECOLLECTION (A)  Final   Report Status 09/10/2021 FINAL  Final         Radiology Studies: CT HEAD WO CONTRAST (5MM)  Result Date: 09/12/2021 CLINICAL DATA:  Stroke, follow-up.  Intracranial hemorrhage. EXAM: CT HEAD WITHOUT CONTRAST TECHNIQUE: Contiguous axial images were obtained from the base of the skull through the vertex without intravenous contrast. COMPARISON:  Head CT 09/09/2021 FINDINGS: Brain: Large parenchymal hemorrhage anteriorly in the right frontal lobe has not significantly changed in size, measuring approximately 6 cm in maximal dimension. Regional edema and mass effect have also not significantly changed,  including 1.2 cm of leftward midline shift. There is a 5 mm parenchymal hemorrhage in the inferolateral aspect of the right parietal lobe without edema, also unchanged. A small amount of hemorrhage in the occipital horns of the lateral ventricles is unchanged, and the ventricles are unchanged in size. No acute cortically based infarct or extra-axial fluid collection is identified. Hypodensities in the cerebral white matter bilaterally are unchanged and nonspecific but compatible with moderate chronic small vessel ischemic disease. There is mild cerebral atrophy. Vascular: Calcified atherosclerosis at the skull base. No hyperdense vessel. Skull: No acute fracture or suspicious osseous lesion. Sinuses/Orbits: Minimal mucosal thickening in the paranasal sinuses. Likely trace chronic bilateral mastoid fluid. Bilateral cataract extraction. Other: None. IMPRESSION: 1. Unchanged large right frontal parenchymal hemorrhage, regional edema, and mass effect including 1.2 cm of leftward midline shift. 2. Unchanged small right parietal parenchymal hemorrhage and small volume intraventricular hemorrhage. 3. Moderate chronic small vessel ischemic disease.  Electronically Signed   By: Logan Bores M.D.   On: 09/12/2021 11:49   CT CHEST ABDOMEN PELVIS WO CONTRAST  Result Date: 09/12/2021 CLINICAL DATA:  Encephalopathy. EXAM: CT CHEST, ABDOMEN AND PELVIS WITHOUT CONTRAST TECHNIQUE: Multidetector CT imaging of the chest, abdomen and pelvis was performed following the standard protocol without IV contrast. COMPARISON:  September 09, 2021. FINDINGS: CT CHEST FINDINGS Cardiovascular: Atherosclerosis of thoracic aorta is noted without aneurysm formation. Mild cardiomegaly is noted. No pericardial effusion is noted. Coronary artery calcifications are noted. Mediastinum/Nodes: No enlarged mediastinal, hilar, or axillary lymph nodes. Thyroid gland, trachea, and esophagus demonstrate no significant findings. Lungs/Pleura: Small bilateral pleural effusions are noted with adjacent subsegmental atelectasis. No pneumothorax is noted. Mild bilateral posterior upper lobe opacities are noted concerning for atelectasis or edema. Musculoskeletal: No chest wall mass or suspicious bone lesions identified. CT ABDOMEN PELVIS FINDINGS Hepatobiliary: Liver is unremarkable on these unenhanced images. No biliary dilatation is noted. No cholelithiasis is noted. Mild gallbladder distention is noted. Pancreas: Unremarkable. No pancreatic ductal dilatation or surrounding inflammatory changes. Spleen: Normal in size without focal abnormality. Adrenals/Urinary Tract: Adrenal glands appear normal. Bilateral renal cysts are again noted including several left renal hyperdense cyst. No hydronephrosis or renal obstruction is noted. Mild urinary bladder distention is noted. Stomach/Bowel: Stomach is within normal limits. Appendix appears normal. No evidence of bowel wall thickening, distention, or inflammatory changes. Vascular/Lymphatic: Aortic atherosclerosis. No enlarged abdominal or pelvic lymph nodes. Reproductive: Mild prostatic enlargement is noted. Other: Small fat containing left inguinal hernia  is noted. No ascites is noted. Musculoskeletal: No fracture is seen. IMPRESSION: Small bilateral pleural effusions are noted with adjacent subsegmental atelectasis. Stable mild opacities are noted posteriorly in both upper lobes concerning for edema or atelectasis. Mild coronary artery calcifications are noted. Mild gallbladder distention is now noted, although no cholelithiasis or surrounding inflammatory changes are noted. Stable bilateral renal cysts are noted. Mild urinary bladder distention is noted. Mild prostatic enlargement. Aortic Atherosclerosis (ICD10-I70.0). Electronically Signed   By: Marijo Conception M.D.   On: 09/12/2021 11:40        Scheduled Meds:  glycopyrrolate  0.2 mg Intravenous Q4H   mouth rinse  15 mL Mouth Rinse QID   scopolamine  1 patch Transdermal Q72H   Continuous Infusions:  dextrose 5% lactated ringers 30 mL/hr at 09/14/21 0336       LOS: 5 days   Time spent= 35 mins    Kayleen Memos, MD [facility]  If 7PM-7AM, please contact night-coverage  09/14/2021, 11:00 AM

## 2021-09-14 NOTE — Progress Notes (Signed)
[facility] ([facility]) Hospital Liaison note.      Received request from TOC manager for family interest in [facility]. Patient information has been forwarded to [facility] for review.  Eligibility confirmed.     [facility] Hospital Liaison will follow up tomorrow or sooner if a room becomes available.   A Please do not hesitate to call with questions.    Thank you,    Mary Anne Robertson, RN, CCM       [facility] Hospital Liaison (listed on [facility] under [facility]osp[facility])     [phone] 

## 2021-09-15 DIAGNOSIS — Z7189 Other specified counseling: Secondary | ICD-10-CM | POA: Diagnosis not present

## 2021-09-15 DIAGNOSIS — Z515 Encounter for palliative care: Secondary | ICD-10-CM | POA: Diagnosis not present

## 2021-09-15 DIAGNOSIS — I629 Nontraumatic intracranial hemorrhage, unspecified: Secondary | ICD-10-CM | POA: Diagnosis not present

## 2021-09-15 DIAGNOSIS — R4182 Altered mental status, unspecified: Secondary | ICD-10-CM | POA: Diagnosis not present

## 2021-09-15 NOTE — Progress Notes (Addendum)
[facility]) Hospital Liaison Note  Patient has been offered a room at [facility] for admission today. MSW contacted daughter/Nan/[phone] and provided above update. Per Bonnita Nasuti, Dr. Domingo Cocking (PMT) would like to reassess patient today before accepting bed offer.  MSW notified TOC and [facility] team of above information. MSW to continue to follow dispo plan.   Addendum:  Family has accepted bed offer following conversation with PMT. MSW to schedule [facility] transport once consents have been completed for [facility] admission. TOC and family made aware of above updates.  Addendum: Please send signed DNR form with patient and RN call report to [phone]. Transport arranged through [facility] transport as [facility] does not operate on weekends.   Expected wait time: 1mins to 1 hour.  Please call with any questions/concerns.    Thank you for the opportunity to participate in this patient's care.   Daphene Calamity, MSW [facility] Liaison  [phone]

## 2021-09-15 NOTE — Progress Notes (Signed)
Report called to [facility].  

## 2021-09-15 NOTE — Discharge Summary (Signed)
Discharge Summary  Derek Schneider XHB:[id number] DOB: 03-19-1926  PCP: Hayden Rasmussen, MD  Admit date: 09/09/2021 Discharge date: 09/15/2021  Time spent: 35 minutes   Recommendations for Outpatient Follow-up:  Continue with hospice care   Discharge Diagnoses:  Active Hospital Problems   Diagnosis Date Noted   Intracranial bleeding ([facility]) 09/09/2021   Atrial fibrillation ([facility]) 05/03/2019   Hx of gastroesophageal reflux (GERD) 11/17/2011   Essential hypertension 06/28/2008    Resolved Hospital Problems  No resolved problems to display.    Vitals:   09/14/21 0628 09/15/21 0457  BP: (!) 174/119 91/67  Pulse: (!) 120 97  Resp: 18   Temp: 98.1 F (36.7 C) 99.2 F (37.3 C)  SpO2: 93% (!) 83%    History of present illness:  87 year old with history of chronic diastolic CHF, A. fib on Xarelto, HTN admitted for confusion from PCP office.  Upon presentation to the ED, work-up revealed intraparenchymal hemorrhage in the right frontal lobe with 5 cm large midline shift effect.  Patient was eventually made comfort care as no further treatment option was available by neurosurgery.  On 09/11/2021, lab studies were ordered at family request.  CT head was repeated on 09/12/2021, no significant changes from prior.  Followed by palliative care medicine.  Plan is to discharge to residential hospice at family's request.   09/15/2021:  Seen at his bedside.  Actively dying.  Patient's family would like patient transferred to residential hospice for end-of-life care.  Hospital Course:  Principal Problem:   Intracranial bleeding (HCC) Active Problems:   Essential hypertension   Hx of gastroesophageal reflux (GERD)   Atrial fibrillation (HCC)  Intracranial hemorrhage right frontal lobe with midline shift, 5.3 cm bleed with 12 mm leftward midline shift shift - Frontal brain bleed with midline shift.  Case discussed by admitting provider with neurosurgeon, not a surgical candidate.  Patient  transitioned to comfort care Repeat CT head on 09/12/2021 per family's request showed unchanged large right frontal parenchymal hemorrhage, regional edema, mass-effect including 1.2 cm of leftward midline shift.  Unchanged right parietal parenchymal hemorrhage and small volume intravascular hemorrhage.   Resolved increase in oral secretions, suspect from silent aspiration Continue comfort care.   Continue Robinul and scopolamine patch.     Essential hypertension Received IV labetalol as needed with parameters   Atrial fibrillation, permanent Chronic diastolic congestive heart failure Last 2D echo done on 03/26/2021 showed LVEF 70 to 75%, left atrial size was severely dilated, right atrial size was mildly dilated.  The mitral valve is abnormal.  Mild to moderate mitral valve regurgitation.  Code Status: DNR comfort care      Procedures: None.  Consultations: Palliative care team  Discharge Exam: BP 91/67   Pulse 97   Temp 99.2 F (37.3 C) (Oral)   Resp 18   Ht 6' (1.829 m)   Wt 75.3 kg   SpO2 (!) 83%   BMI 22.51 kg/m  General: 87 y.o. year-old male frail-appearing.  Actively dying.   Cardiovascular: Regular rate and rhythm with no rubs or gallops.  No thyromegaly or JVD noted.   Respiratory: Shallow breaths. Abdomen: Soft nondistended. Musculoskeletal: No lower extremity edema. Psychiatry: Unable to assess mood.  End-of-life.  Discharge Instructions You were cared for by a hospitalist during your hospital stay. If you have any questions about your discharge medications or the care you received while you were in the hospital after you are discharged, you can call the unit and asked to speak with  the hospitalist on call if the hospitalist that took care of you is not available. Once you are discharged, your primary care physician will handle any further medical issues. Please note that NO REFILLS for any discharge medications will be authorized once you are discharged, as it is  imperative that you return to your primary care physician (or establish a relationship with a primary care physician if you do not have one) for your aftercare needs so that they can reassess your need for medications and monitor your lab values.   Allergies as of 09/15/2021   No Known Allergies      Medication List     STOP taking these medications    albuterol 108 (90 Base) MCG/ACT inhaler Commonly known as: VENTOLIN HFA   cholecalciferol 1000 units tablet Commonly known as: VITAMIN D   Dexilant 60 MG capsule Generic drug: dexlansoprazole   diltiazem 120 MG 24 hr capsule Commonly known as: CARDIZEM CD   diltiazem 30 MG tablet Commonly known as: CARDIZEM   doxycycline 100 MG tablet Commonly known as: VIBRA-TABS   finasteride 5 MG tablet Commonly known as: PROSCAR   fluticasone furoate-vilanterol 200-25 MCG/ACT Aepb Commonly known as: BREO ELLIPTA   furosemide 20 MG tablet Commonly known as: LASIX   gabapentin 100 MG capsule Commonly known as: NEURONTIN   losartan 100 MG tablet Commonly known as: COZAAR   nebivolol 2.5 MG tablet Commonly known as: Bystolic   tamsulosin 0.4 MG Caps capsule Commonly known as: FLOMAX   triamcinolone cream 0.1 % Commonly known as: KENALOG   Xarelto 15 MG Tabs tablet Generic drug: Rivaroxaban       No Known Allergies    The results of significant diagnostics from this hospitalization (including imaging, microbiology, ancillary and laboratory) are listed below for reference.    Significant Diagnostic Studies: CT HEAD WO CONTRAST (5MM)  Result Date: 09/12/2021 CLINICAL DATA:  Stroke, follow-up.  Intracranial hemorrhage. EXAM: CT HEAD WITHOUT CONTRAST TECHNIQUE: Contiguous axial images were obtained from the base of the skull through the vertex without intravenous contrast. COMPARISON:  Head CT 09/09/2021 FINDINGS: Brain: Large parenchymal hemorrhage anteriorly in the right frontal lobe has not significantly changed in  size, measuring approximately 6 cm in maximal dimension. Regional edema and mass effect have also not significantly changed, including 1.2 cm of leftward midline shift. There is a 5 mm parenchymal hemorrhage in the inferolateral aspect of the right parietal lobe without edema, also unchanged. A small amount of hemorrhage in the occipital horns of the lateral ventricles is unchanged, and the ventricles are unchanged in size. No acute cortically based infarct or extra-axial fluid collection is identified. Hypodensities in the cerebral white matter bilaterally are unchanged and nonspecific but compatible with moderate chronic small vessel ischemic disease. There is mild cerebral atrophy. Vascular: Calcified atherosclerosis at the skull base. No hyperdense vessel. Skull: No acute fracture or suspicious osseous lesion. Sinuses/Orbits: Minimal mucosal thickening in the paranasal sinuses. Likely trace chronic bilateral mastoid fluid. Bilateral cataract extraction. Other: None. IMPRESSION: 1. Unchanged large right frontal parenchymal hemorrhage, regional edema, and mass effect including 1.2 cm of leftward midline shift. 2. Unchanged small right parietal parenchymal hemorrhage and small volume intraventricular hemorrhage. 3. Moderate chronic small vessel ischemic disease. Electronically Signed   By: Logan Bores M.D.   On: 09/12/2021 11:49   CT HEAD WO CONTRAST (5MM)  Result Date: 09/09/2021 CLINICAL DATA:  Delirium, altered mental status, sepsis. EXAM: CT HEAD WITHOUT CONTRAST TECHNIQUE: Contiguous axial images were obtained from  the base of the skull through the vertex without intravenous contrast. COMPARISON:  None. FINDINGS: Brain: Acute parenchymal hemorrhage within the RIGHT frontal lobe, measuring approximally 5.3 cm greatest dimension, with surrounding parenchymal edema. The combination of hemorrhage and edema is causing mass effect on the adjacent parenchyma, effacement of the frontal horn of the RIGHT lateral  ventricle, and approximately 12 mm leftward midline shift. No evidence of transtentorial or tonsillar herniation. Small amount of acute hemorrhage layering within the occipital horns of each ventricle. Vascular: Chronic calcified atherosclerotic changes of the large vessels at the skull base. No unexpected hyperdense vessel. Skull: Normal. Negative for fracture or focal lesion. Sinuses/Orbits: No acute finding. Other: None. IMPRESSION: 1. Acute large parenchymal hemorrhage within the RIGHT frontal lobe, measuring approximally 5 cm greatest dimension, with surrounding parenchymal edema, most likely hypertensive or hemorrhagic infarct. The combination of hemorrhage and edema is causing mass effect on the adjacent frontal lobe parenchyma, effacement of the frontal horn of the RIGHT lateral ventricle, and approximately 12 mm leftward midline shift. 2. Small amount of associated acute hemorrhage layering within the occipital horns of each ventricle. Critical Value/emergent results were called by telephone at the time of interpretation on 09/09/2021 at 7:58 pm to provider [facility] , who verbally acknowledged these results. Electronically Signed   By: Franki Cabot M.D.   On: 09/09/2021 20:00   MR LUMBAR SPINE WO CONTRAST  Result Date: 09/04/2021 CLINICAL DATA:  Low back pain radiating into left leg for several weeks and notes worse on left side. EXAM: MRI LUMBAR SPINE WITHOUT CONTRAST TECHNIQUE: Multiplanar, multisequence MR imaging of the lumbar spine was performed. No intravenous contrast was administered. COMPARISON:  None. FINDINGS: Segmentation:  Standard. Alignment:  Dextroscoliosis. Vertebrae: Degenerative endplate irregularity. No substantial marrow edema. No suspicious osseous lesion. Conus medullaris and cauda equina: Conus extends to the T12 level. Conus and cauda equina appear normal. Paraspinal and other soft tissues: Numerous renal cysts. Disc levels: Small disc bulges are present at lower  thoracic levels. L1-L2: Disc bulge slightly eccentric to the left. Mild facet arthropathy with ligamentum flavum infolding. No canal or right foraminal stenosis. Slight effacement of left subarticular recess. Mild left foraminal stenosis. L2-L3: Disc bulge with endplate osteophytic ridging eccentric to the left. Mild facet arthropathy with ligamentum flavum infolding. Mild canal stenosis. Partial effacement of subarticular recesses. No right foraminal stenosis. Mild left foraminal stenosis. L3-L4: Disc bulge with endplate osteophytic ridging. Moderate facet arthropathy with ligamentum flavum infolding. Minor canal stenosis. Effacement of the right subarticular recess. Moderate right and mild left foraminal stenosis. L4-L5: Disc bulge with endplate osteophytic ridging eccentric to the right. Moderate facet arthropathy with ligamentum flavum infolding. No canal stenosis. Slight effacement of subarticular recesses. Moderate right foraminal stenosis. No left foraminal stenosis. L5-S1: Disc bulge with endplate osteophytic ridging. Mild facet arthropathy. No canal stenosis. Moderate right and mild to moderate left foraminal stenosis. IMPRESSION: Multilevel degenerative changes as detailed above. No high-grade canal or left foraminal stenosis. Left subarticular recess narrowing at L2-L3. Electronically Signed   By: Macy Mis M.D.   On: 09/04/2021 11:42   DG Chest Portable 1 View  Result Date: 09/09/2021 CLINICAL DATA:  Shortness of breath. EXAM: PORTABLE CHEST 1 VIEW COMPARISON:  Chest x-ray 09/09/2021 FINDINGS: The heart is enlarged, unchanged. There is minimal atelectasis in the lower lungs. There is no lung consolidation, pleural effusion or pneumothorax. Mediastinal silhouette appears stable. There are no acute fractures. IMPRESSION: 1. Cardiomegaly. 2. No evidence for pneumonia or edema. Electronically Signed  By: Ronney Asters M.D.   On: 09/09/2021 18:33   DG Chest Port 1 View  Result Date:  09/09/2021 CLINICAL DATA:  Possible sepsis EXAM: PORTABLE CHEST 1 VIEW COMPARISON:  CT 10/03/2014 FINDINGS: No focal consolidation or pleural effusion. Mild cardiomegaly. Streaky atelectasis or scarring at the bases. No visible pneumothorax. IMPRESSION: 1. Cardiomegaly without acute focal airspace disease 2. Streaky atelectasis or scarring at the bases. Electronically Signed   By: Donavan Foil M.D.   On: 09/09/2021 17:08   CT CHEST ABDOMEN PELVIS WO CONTRAST  Result Date: 09/12/2021 CLINICAL DATA:  Encephalopathy. EXAM: CT CHEST, ABDOMEN AND PELVIS WITHOUT CONTRAST TECHNIQUE: Multidetector CT imaging of the chest, abdomen and pelvis was performed following the standard protocol without IV contrast. COMPARISON:  September 09, 2021. FINDINGS: CT CHEST FINDINGS Cardiovascular: Atherosclerosis of thoracic aorta is noted without aneurysm formation. Mild cardiomegaly is noted. No pericardial effusion is noted. Coronary artery calcifications are noted. Mediastinum/Nodes: No enlarged mediastinal, hilar, or axillary lymph nodes. Thyroid gland, trachea, and esophagus demonstrate no significant findings. Lungs/Pleura: Small bilateral pleural effusions are noted with adjacent subsegmental atelectasis. No pneumothorax is noted. Mild bilateral posterior upper lobe opacities are noted concerning for atelectasis or edema. Musculoskeletal: No chest wall mass or suspicious bone lesions identified. CT ABDOMEN PELVIS FINDINGS Hepatobiliary: Liver is unremarkable on these unenhanced images. No biliary dilatation is noted. No cholelithiasis is noted. Mild gallbladder distention is noted. Pancreas: Unremarkable. No pancreatic ductal dilatation or surrounding inflammatory changes. Spleen: Normal in size without focal abnormality. Adrenals/Urinary Tract: Adrenal glands appear normal. Bilateral renal cysts are again noted including several left renal hyperdense cyst. No hydronephrosis or renal obstruction is noted. Mild urinary bladder  distention is noted. Stomach/Bowel: Stomach is within normal limits. Appendix appears normal. No evidence of bowel wall thickening, distention, or inflammatory changes. Vascular/Lymphatic: Aortic atherosclerosis. No enlarged abdominal or pelvic lymph nodes. Reproductive: Mild prostatic enlargement is noted. Other: Small fat containing left inguinal hernia is noted. No ascites is noted. Musculoskeletal: No fracture is seen. IMPRESSION: Small bilateral pleural effusions are noted with adjacent subsegmental atelectasis. Stable mild opacities are noted posteriorly in both upper lobes concerning for edema or atelectasis. Mild coronary artery calcifications are noted. Mild gallbladder distention is now noted, although no cholelithiasis or surrounding inflammatory changes are noted. Stable bilateral renal cysts are noted. Mild urinary bladder distention is noted. Mild prostatic enlargement. Aortic Atherosclerosis (ICD10-I70.0). Electronically Signed   By: Marijo Conception M.D.   On: 09/12/2021 11:40   CT CHEST ABDOMEN PELVIS WO CONTRAST  Result Date: 09/09/2021 CLINICAL DATA:  Sepsis and abdominal pain. EXAM: CT CHEST, ABDOMEN AND PELVIS WITHOUT CONTRAST TECHNIQUE: Multidetector CT imaging of the chest, abdomen and pelvis was performed following the standard protocol without IV contrast. COMPARISON:  CT chest 10/03/2014.  CT abdomen and pelvis 09/04/2010. FINDINGS: CT CHEST FINDINGS Cardiovascular: The heart is mildly enlarged. Aorta is normal in size. There are atherosclerotic calcifications of the aorta and coronary arteries. There is no pericardial effusion. Mediastinum/Nodes: No enlarged mediastinal, hilar, or axillary lymph nodes. Thyroid gland, trachea, and esophagus demonstrate no significant findings. Lungs/Pleura: There is smooth interlobular septal thickening in the lung apices. There is patchy airspace disease and atelectasis in the bilateral lower lobes. There are small bilateral pleural effusions, right  greater than left. There is also some airspace disease in the dependent portions of the bilateral upper lobes. There is no pneumothorax. Trachea and central airways are patent. Musculoskeletal: No chest wall mass or suspicious bone lesions identified. No  acute fractures. CT ABDOMEN PELVIS FINDINGS Hepatobiliary: No focal liver abnormality is seen. No gallstones, gallbladder wall thickening, or biliary dilatation. Pancreas: Unremarkable. No pancreatic ductal dilatation or surrounding inflammatory changes. Spleen: Normal in size without focal abnormality. Adrenals/Urinary Tract: The bilateral adrenal glands are within normal limits. There are numerous bilateral cysts and hypodensities which are too small to characterize. These have increased in size and number compared to the prior study. The largest cyst on the left measures 4.5 cm. The largest cyst on the right measures 2.7 cm. There are additional mildly hyperdense lesions which are too small to characterize in both kidneys. There is no hydronephrosis. No urinary tract calculi are seen. Bladder is within normal limits. Stomach/Bowel: Stomach is within normal limits. Appendix appears normal. No evidence of bowel wall thickening, distention, or inflammatory changes. Vascular/Lymphatic: No significant vascular findings are present. No enlarged abdominal or pelvic lymph nodes. Reproductive: Prostate gland is mildly enlarged. Other: There is a small right inguinal hernia containing nondilated bowel. There is a small fat containing left inguinal hernia. There is no ascites or free air. Musculoskeletal: Degenerative changes affect the spine. Sclerotic density in the left iliac bone measures 11 mm and has increased in size compared to 2011. IMPRESSION: 1. Small bilateral pleural effusions. 2. Bilateral upper lobe and lower lobe airspace disease may represent infection or edema. 3. Cardiomegaly. 4. Bilateral renal cysts. Additional hyperdense areas in both kidneys are too  small to characterize on this study and indeterminate, possibly complex cysts. These can be further evaluated with MRI as clinically warranted. 5. Right inguinal hernia containing bowel.  No bowel obstruction. 6. Small sclerotic osseous lesion in the left pelvis has minimally increased in size compared to 2011. Electronically Signed   By: Ronney Asters M.D.   On: 09/09/2021 20:10    Microbiology: Recent Results (from the past 240 hour(s))  Blood Culture (routine x 2)     Status: None   Collection Time: 09/09/21  4:06 PM   Specimen: BLOOD  Result Value Ref Range Status   Specimen Description   Final    BLOOD BLOOD RIGHT FOREARM Performed at [facility] [address], [place], [place] [address]    Special Requests   Final    BOTTLES DRAWN AEROBIC AND ANAEROBIC Blood Culture adequate volume Performed at [facility] [address] [place], [place] [address]    Culture   Final    NO GROWTH 5 DAYS Performed at [facility], [place] [address], [place], [place] [address]    Report Status 09/14/2021 FINAL  Final  Blood Culture (routine x 2)     Status: None   Collection Time: 09/09/21  4:11 PM   Specimen: BLOOD  Result Value Ref Range Status   Specimen Description   Final    BLOOD RIGHT ANTECUBITAL Performed at [facility] [address], [place], [place] [address]    Special Requests   Final    BOTTLES DRAWN AEROBIC AND ANAEROBIC Blood Culture results may not be optimal due to an excessive volume of blood received in culture bottles Performed at [facility] [address], [place], [place] [address]    Culture   Final    NO GROWTH 5 DAYS Performed at [facility], [place] [address], [place], [place] [address]    Report Status 09/14/2021 FINAL  Final  Resp Panel by RT-PCR (Flu A&B, Covid)     Status: None  Collection Time: 09/09/21  4:37 PM   Specimen: Nasopharyngeal(NP) swabs in vial transport  medium  Result Value Ref Range Status   SARS Coronavirus 2 by RT PCR NEGATIVE NEGATIVE Final    Comment: (NOTE) SARS-CoV-2 target nucleic acids are NOT DETECTED.  The SARS-CoV-2 RNA is generally detectable in upper respiratory specimens during the acute phase of infection. The lowest concentration of SARS-CoV-2 viral copies this assay can detect is 138 copies/mL. A negative result does not preclude SARS-Cov-2 infection and should not be used as the sole basis for treatment or other patient management decisions. A negative result may occur with  improper specimen collection/handling, submission of specimen other than nasopharyngeal swab, presence of viral mutation(s) within the areas targeted by this assay, and inadequate number of viral copies(<138 copies/mL). A negative result must be combined with clinical observations, patient history, and epidemiological information. The expected result is Negative.  Fact Sheet for Patients:  EntrepreneurPulse.com.au  Fact Sheet for Healthcare Providers:  IncredibleEmployment.be  This test is no t yet approved or cleared by the [place] FDA and  has been authorized for detection and/or diagnosis of SARS-CoV-2 by FDA under an Emergency Use Authorization (EUA). This EUA will remain  in effect (meaning this test can be used) for the duration of the COVID-19 declaration under Section 564(b)(1) of the Act, 21 U.S.C.section 360bbb-3(b)(1), unless the authorization is terminated  or revoked sooner.       Influenza A by PCR NEGATIVE NEGATIVE Final   Influenza B by PCR NEGATIVE NEGATIVE Final    Comment: (NOTE) The Xpert Xpress SARS-CoV-2/FLU/RSV plus assay is intended as an aid in the diagnosis of influenza from Nasopharyngeal swab specimens and should not be used as a sole basis for treatment. Nasal washings and aspirates are unacceptable for Xpert Xpress SARS-CoV-2/FLU/RSV testing.  Fact Sheet for  Patients: EntrepreneurPulse.com.au  Fact Sheet for Healthcare Providers: IncredibleEmployment.be  This test is not yet approved or cleared by the [place] FDA and has been authorized for detection and/or diagnosis of SARS-CoV-2 by FDA under an Emergency Use Authorization (EUA). This EUA will remain in effect (meaning this test can be used) for the duration of the COVID-19 declaration under Section 564(b)(1) of the Act, 21 U.S.C. section 360bbb-3(b)(1), unless the authorization is terminated or revoked.  Performed at [facility], [place] [address], [place], [place] [address]   Urine Culture     Status: Abnormal   Collection Time: 09/09/21  4:37 PM   Specimen: In/Out Cath Urine  Result Value Ref Range Status   Specimen Description   Final    IN/OUT CATH URINE Performed at [facility] [address], [place], [place] [address]    Special Requests   Final    NONE Performed at [place] [facility], [place] [address] [place], [place] [address]    Culture MULTIPLE SPECIES PRESENT, SUGGEST RECOLLECTION (A)  Final   Report Status 09/10/2021 FINAL  Final     Labs: Basic Metabolic Panel: Recent Labs  Lab 09/09/21 1606 09/10/21 0457 09/12/21 0507  NA 135 135 137  K 3.8 4.0 3.7  CL 101 104 109  CO2 23 24 22   GLUCOSE 120* 101* 107*  BUN 24* 26* 33*  CREATININE 1.17 1.13 1.20  CALCIUM 8.6* 8.5* 8.3*  MG  --   --  2.1  PHOS  --   --  2.9   Liver Function Tests: Recent Labs  Lab 09/09/21 1606 09/10/21 0457 09/12/21 0507  AST  31 33 20  ALT 21 22 16   ALKPHOS 65 62 61  BILITOT 1.8* 1.9* 1.1  PROT 7.2 6.2* 5.5*  ALBUMIN 3.9 3.3* 2.9*   No results for input(s): LIPASE, AMYLASE in the last 168 hours. No results for input(s): AMMONIA in the last 168 hours. CBC: Recent Labs  Lab 09/09/21 1606 09/10/21 0457 09/12/21 0507  WBC 12.1* 12.1* 11.8*  NEUTROABS 10.6*  --  9.9*  HGB 13.3  12.5* 14.0  HCT 39.8 37.9* 41.5  MCV 90.2 89.6 87.9  PLT 184 156 164   Cardiac Enzymes: No results for input(s): CKTOTAL, CKMB, CKMBINDEX, TROPONINI in the last 168 hours. BNP: BNP (last 3 results) No results for input(s): BNP in the last 8760 hours.  ProBNP (last 3 results) Recent Labs    02/26/21 0858  PROBNP 2,005*    CBG: Recent Labs  Lab 09/10/21 0017  GLUCAP 118*       Signed:  Kayleen Memos, MD [facility] 09/15/2021, 12:07 PM

## 2021-09-15 NOTE — Progress Notes (Addendum)
Daily Progress Note   Patient Name: Derek Schneider       Date: 09/15/2021 DOB: Mar 29, 1926  Age: 87 y.o. MRN#: [id number] Attending Physician: No att. providers found Primary Care Physician: Hayden Rasmussen, MD Admit Date: 09/09/2021  Reason for Consultation/Follow-up: Establishing goals of care and Terminal Care  Subjective: I saw and examined Derek Schneider.  He was lying in bed in no distress.  Discussed terminal care and potential transfer to [facility].  Length of Stay: 6 Physical Exam         General: Sleeping. HEENT: No bruits, no goiter, no JVD Heart: Regular rate and rhythm. No murmur appreciated. Lungs: Fair air movement, scattered coarse  Regular respirations Abdomen: Soft, nontender, nondistended, positive bowel sounds.   Ext: No significant edema Skin: Warm and dry  Vital Signs: BP 91/67   Pulse 97   Temp 99.2 F (37.3 C) (Oral)   Resp 18   Ht 6' (1.829 m)   Wt 75.3 kg   SpO2 (!) 83%   BMI 22.51 kg/m  SpO2: SpO2: (!) 83 % O2 Device: O2 Device: Nasal Cannula O2 Flow Rate: O2 Flow Rate (L/min): 4 L/min  Intake/output summary:  Intake/Output Summary (Last 24 hours) at 09/15/2021 2249 Last data filed at 09/15/2021 0515 Gross per 24 hour  Intake 0 ml  Output 100 ml  Net -100 ml    LBM: Last BM Date:  (PTA) Baseline Weight: Weight: 75.3 kg Most recent weight: Weight: 75.3 kg       Palliative Assessment/Data:    Flowsheet Rows    Flowsheet Row Most Recent Value  Intake Tab   Referral Department Hospitalist  Unit at Time of Referral Oncology Unit  Palliative Care Primary Diagnosis Neurology  Date Notified 09/10/21  Palliative Care Type New Palliative care  Reason for referral End of Life Care Assistance  Date of Admission 09/09/21  Date first  seen by Palliative Care 09/10/21  # of days Palliative referral response time 0 Day(s)  # of days IP prior to Palliative referral 1  Clinical Assessment   Palliative Performance Scale Score 10%  Psychosocial & Spiritual Assessment   Palliative Care Outcomes   Patient/Family meeting held? Yes  Who was at the meeting? Wife, daughters, son, daughter in law       Patient Active Problem  List   Diagnosis Date Noted   Intracranial bleeding ([place]) 09/09/2021   Atrial fibrillation ([place]) 05/03/2019   Chest pain, atypical 11/17/2011   Hx of gastroesophageal reflux (GERD) 11/17/2011   GERD 06/29/2008   Essential hypertension 06/28/2008   ESOPHAGEAL STRICTURE 06/28/2008   HIATAL HERNIA 06/28/2008   DIVERTICULOSIS, COLON 06/28/2008    Palliative Care Assessment & Plan   Patient Profile: 87 y.o. male  with past medical history of CHF, A. fib on Xarelto, hypertension admitted on 09/09/2021 after being seen at his PCP office and sent for infusion.  He was found to have intraparenchymal hemorrhage in the right frontal lobe with 5 cm hemorrhage with 12 mm midline shift.  He was transition to comfort care and palliative consulted for assistance with terminal care.  Recommendations/Plan: Derek Schneider is actively dying.  He appears to be much weaker to me today. Excess secretions: Primary concern at this point in time.  Continue Robinul to 0.4 every 4 hours.  Agree with scopolamine patch.  Discussed nonpharmacologic management of secretions and the fact that this can be an unavoidable an expected part of the dying process.  Discussed other signs of agitation and use of medications for agitation as needed and to relieve any pain or shortness of breath. Discussed risk vs benefit of transfer to residential hospice as well as concern that he may die in transport.  Family would like to proceed with transport.  Goals of Care and Additional Recommendations: Limitations on Scope of Treatment: Full Comfort  Care  Code Status:    Code Status Orders  (From admission, onward)           Start     Ordered   09/10/21 1213  Do not attempt resuscitation (DNR)  Continuous       Question Answer Comment  In the event of cardiac or respiratory ARREST Do not call a "code blue"   In the event of cardiac or respiratory ARREST Do not perform Intubation, CPR, defibrillation or ACLS   In the event of cardiac or respiratory ARREST Use medication by any route, position, wound care, and other measures to relive pain and suffering. May use oxygen, suction and manual treatment of airway obstruction as needed for comfort.      09/10/21 1214           Code Status History     Date Active Date Inactive Code Status Order ID Comments User Context   09/09/2021 2245 09/10/2021 1214 DNR [id number]  Rise Patience, MD ED   09/09/2021 1813 09/09/2021 2245 DNR [id number]  Fredia Sorrow, MD ED      Advance Directive Documentation    Flowsheet Row Most Recent Value  Type of Advance Directive Out of facility DNR (pink MOST or yellow form)  Pre-existing out of facility DNR order (yellow form or pink MOST form) --  "MOST" Form in Place? --       Prognosis:  Hours - Days  Discharge Planning: To Be Determined  Care plan was discussed with family, Dr. Nevada Crane, hospice liaison  Thank you for allowing the Palliative Medicine Team to assist in the care of this patient.   Total Time 30 Prolonged Time Billed No   Greater than 50%  of this time was spent counseling and coordinating care related to the above assessment and plan.   Micheline Rough, MD  Please contact Palliative Medicine Team phone at [phone] for questions and concerns.

## 2021-09-25 ENCOUNTER — Ambulatory Visit: Payer: Medicare Other | Admitting: Cardiovascular Disease

## 2021-10-13 DEATH — deceased
